# Patient Record
Sex: Male | Born: 1980 | Race: White | Hispanic: No | Marital: Single | Smoking: Never smoker
Health system: Southern US, Community
[De-identification: ages and names within clinical notes are randomized; demographics above are authoritative.]

## PROBLEM LIST (undated history)

## (undated) DIAGNOSIS — J45909 Unspecified asthma, uncomplicated: Secondary | ICD-10-CM

## (undated) DIAGNOSIS — F411 Generalized anxiety disorder: Secondary | ICD-10-CM

## (undated) DIAGNOSIS — M899 Disorder of bone, unspecified: Secondary | ICD-10-CM

## (undated) DIAGNOSIS — E708 Other disorders of aromatic amino-acid metabolism: Secondary | ICD-10-CM

## (undated) DIAGNOSIS — K5289 Other specified noninfective gastroenteritis and colitis: Secondary | ICD-10-CM

## (undated) DIAGNOSIS — H6980 Other specified disorders of Eustachian tube, unspecified ear: Secondary | ICD-10-CM

## (undated) DIAGNOSIS — N189 Chronic kidney disease, unspecified: Secondary | ICD-10-CM

## (undated) DIAGNOSIS — I498 Other specified cardiac arrhythmias: Secondary | ICD-10-CM

## (undated) DIAGNOSIS — K5100 Ulcerative (chronic) pancolitis without complications: Secondary | ICD-10-CM

## (undated) DIAGNOSIS — E2740 Unspecified adrenocortical insufficiency: Secondary | ICD-10-CM

## (undated) DIAGNOSIS — D689 Coagulation defect, unspecified: Secondary | ICD-10-CM

## (undated) DIAGNOSIS — E7089 Other disorders of aromatic amino-acid metabolism: Secondary | ICD-10-CM

## (undated) DIAGNOSIS — E70331 Hermansky-Pudlak syndrome: Secondary | ICD-10-CM

## (undated) DIAGNOSIS — H547 Unspecified visual loss: Secondary | ICD-10-CM

## (undated) DIAGNOSIS — Z9049 Acquired absence of other specified parts of digestive tract: Secondary | ICD-10-CM

## (undated) DIAGNOSIS — M79609 Pain in unspecified limb: Secondary | ICD-10-CM

## (undated) DIAGNOSIS — T7840XA Allergy, unspecified, initial encounter: Secondary | ICD-10-CM

## (undated) DIAGNOSIS — D509 Iron deficiency anemia, unspecified: Secondary | ICD-10-CM

## (undated) DIAGNOSIS — J309 Allergic rhinitis, unspecified: Secondary | ICD-10-CM

## (undated) DIAGNOSIS — H6990 Unspecified Eustachian tube disorder, unspecified ear: Secondary | ICD-10-CM

## (undated) DIAGNOSIS — L708 Other acne: Secondary | ICD-10-CM

## (undated) DIAGNOSIS — M949 Disorder of cartilage, unspecified: Secondary | ICD-10-CM

## (undated) DIAGNOSIS — J189 Pneumonia, unspecified organism: Secondary | ICD-10-CM

## (undated) HISTORY — DX: Iron deficiency anemia, unspecified: D50.9

## (undated) HISTORY — DX: Hermansky-Pudlak syndrome: E70.331

## (undated) HISTORY — DX: Unspecified adrenocortical insufficiency: E27.40

## (undated) HISTORY — DX: Generalized anxiety disorder: F41.1

## (undated) HISTORY — DX: Allergic rhinitis, unspecified: J30.9

## (undated) HISTORY — DX: Pain in unspecified limb: M79.609

## (undated) HISTORY — DX: Pneumonia, unspecified organism: J18.9

## (undated) HISTORY — DX: Allergy, unspecified, initial encounter: T78.40XA

## (undated) HISTORY — DX: Disorder of bone, unspecified: M89.9

## (undated) HISTORY — DX: Other specified noninfective gastroenteritis and colitis: K52.89

## (undated) HISTORY — DX: Unspecified visual loss: H54.7

## (undated) HISTORY — DX: Other disorders of aromatic amino-acid metabolism: E70.8

## (undated) HISTORY — DX: Ulcerative (chronic) pancolitis without complications: K51.00

## (undated) HISTORY — DX: Other disorders of aromatic amino-acid metabolism: E70.89

## (undated) HISTORY — DX: Disorder of cartilage, unspecified: M94.9

## (undated) HISTORY — DX: Other acne: L70.8

## (undated) HISTORY — DX: Acquired absence of other specified parts of digestive tract: Z90.49

## (undated) HISTORY — DX: Unspecified asthma, uncomplicated: J45.909

## (undated) HISTORY — DX: Other specified cardiac arrhythmias: I49.8

## (undated) HISTORY — DX: Unspecified eustachian tube disorder, unspecified ear: H69.90

## (undated) HISTORY — DX: Other specified disorders of Eustachian tube, unspecified ear: H69.80

---
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
## 2001-01-10 ENCOUNTER — Encounter (HOSPITAL_COMMUNITY): Admission: RE | Admit: 2001-01-10 | Discharge: 2001-04-10 | Payer: Self-pay | Admitting: Oncology

## 2001-04-30 ENCOUNTER — Encounter (HOSPITAL_COMMUNITY): Admission: RE | Admit: 2001-04-30 | Discharge: 2001-06-11 | Payer: Self-pay | Admitting: Oncology

## 2001-07-25 ENCOUNTER — Encounter (HOSPITAL_COMMUNITY): Admission: RE | Admit: 2001-07-25 | Discharge: 2001-10-23 | Payer: Self-pay | Admitting: Oncology

## 2001-11-07 ENCOUNTER — Encounter (HOSPITAL_COMMUNITY): Admission: RE | Admit: 2001-11-07 | Discharge: 2002-02-05 | Payer: Self-pay | Admitting: Oncology

## 2001-12-09 HISTORY — PX: OTHER SURGICAL HISTORY: SHX169

## 2002-01-30 ENCOUNTER — Other Ambulatory Visit: Admission: RE | Admit: 2002-01-30 | Discharge: 2002-01-30 | Payer: Self-pay | Admitting: Oncology

## 2002-01-30 ENCOUNTER — Encounter (INDEPENDENT_AMBULATORY_CARE_PROVIDER_SITE_OTHER): Payer: Self-pay

## 2002-02-21 ENCOUNTER — Encounter (HOSPITAL_COMMUNITY): Admission: RE | Admit: 2002-02-21 | Discharge: 2002-05-22 | Payer: Self-pay | Admitting: Oncology

## 2002-06-03 ENCOUNTER — Encounter (HOSPITAL_COMMUNITY): Admission: RE | Admit: 2002-06-03 | Discharge: 2002-09-01 | Payer: Self-pay | Admitting: Oncology

## 2002-09-02 ENCOUNTER — Encounter (HOSPITAL_COMMUNITY): Admission: RE | Admit: 2002-09-02 | Discharge: 2002-09-03 | Payer: Self-pay | Admitting: Oncology

## 2002-11-12 ENCOUNTER — Encounter: Admission: RE | Admit: 2002-11-12 | Discharge: 2002-11-12 | Payer: Self-pay | Admitting: Oncology

## 2003-01-07 ENCOUNTER — Encounter (HOSPITAL_COMMUNITY): Admission: RE | Admit: 2003-01-07 | Discharge: 2003-04-07 | Payer: Self-pay | Admitting: Oncology

## 2003-05-01 ENCOUNTER — Encounter (HOSPITAL_COMMUNITY): Admission: RE | Admit: 2003-05-01 | Discharge: 2003-05-01 | Payer: Self-pay | Admitting: Oncology

## 2003-06-13 ENCOUNTER — Encounter: Admission: RE | Admit: 2003-06-13 | Discharge: 2003-09-11 | Payer: Self-pay | Admitting: Oncology

## 2003-09-01 ENCOUNTER — Encounter: Payer: Self-pay | Admitting: Emergency Medicine

## 2003-09-01 ENCOUNTER — Observation Stay (HOSPITAL_COMMUNITY): Admission: AD | Admit: 2003-09-01 | Discharge: 2003-09-02 | Payer: Self-pay

## 2003-09-01 HISTORY — PX: OTHER SURGICAL HISTORY: SHX169

## 2003-10-15 ENCOUNTER — Encounter (HOSPITAL_COMMUNITY): Admission: RE | Admit: 2003-10-15 | Discharge: 2004-01-13 | Payer: Self-pay | Admitting: Oncology

## 2004-02-11 ENCOUNTER — Encounter (HOSPITAL_COMMUNITY): Admission: RE | Admit: 2004-02-11 | Discharge: 2004-05-11 | Payer: Self-pay | Admitting: Oncology

## 2004-06-17 ENCOUNTER — Encounter (HOSPITAL_COMMUNITY): Admission: RE | Admit: 2004-06-17 | Discharge: 2004-09-15 | Payer: Self-pay | Admitting: Oncology

## 2004-08-25 ENCOUNTER — Ambulatory Visit: Payer: Self-pay | Admitting: Internal Medicine

## 2004-08-25 ENCOUNTER — Ambulatory Visit (HOSPITAL_BASED_OUTPATIENT_CLINIC_OR_DEPARTMENT_OTHER): Admission: RE | Admit: 2004-08-25 | Discharge: 2004-08-25 | Payer: Self-pay | Admitting: Internal Medicine

## 2004-08-25 HISTORY — PX: OTHER SURGICAL HISTORY: SHX169

## 2004-09-21 ENCOUNTER — Emergency Department (HOSPITAL_COMMUNITY): Admission: EM | Admit: 2004-09-21 | Discharge: 2004-09-21 | Payer: Self-pay | Admitting: Emergency Medicine

## 2004-10-04 ENCOUNTER — Ambulatory Visit: Payer: Self-pay | Admitting: Internal Medicine

## 2004-10-13 ENCOUNTER — Encounter (HOSPITAL_COMMUNITY): Admission: RE | Admit: 2004-10-13 | Discharge: 2004-10-19 | Payer: Self-pay | Admitting: Oncology

## 2005-02-24 ENCOUNTER — Encounter (HOSPITAL_COMMUNITY): Admission: RE | Admit: 2005-02-24 | Discharge: 2005-05-25 | Payer: Self-pay | Admitting: Oncology

## 2005-03-29 ENCOUNTER — Ambulatory Visit: Payer: Self-pay | Admitting: Oncology

## 2005-05-09 ENCOUNTER — Ambulatory Visit: Payer: Self-pay | Admitting: Pulmonary Disease

## 2005-05-16 ENCOUNTER — Ambulatory Visit: Payer: Self-pay | Admitting: Internal Medicine

## 2005-06-02 ENCOUNTER — Ambulatory Visit: Payer: Self-pay | Admitting: Internal Medicine

## 2005-06-13 ENCOUNTER — Ambulatory Visit: Payer: Self-pay | Admitting: Internal Medicine

## 2005-07-18 ENCOUNTER — Ambulatory Visit: Payer: Self-pay | Admitting: Cardiology

## 2005-08-04 ENCOUNTER — Ambulatory Visit: Payer: Self-pay | Admitting: Infectious Diseases

## 2005-09-04 ENCOUNTER — Encounter (HOSPITAL_COMMUNITY): Admission: RE | Admit: 2005-09-04 | Discharge: 2005-12-03 | Payer: Self-pay | Admitting: Oncology

## 2006-01-18 ENCOUNTER — Ambulatory Visit: Payer: Self-pay | Admitting: Internal Medicine

## 2006-02-13 ENCOUNTER — Ambulatory Visit: Payer: Self-pay | Admitting: Internal Medicine

## 2006-08-29 ENCOUNTER — Ambulatory Visit: Payer: Self-pay | Admitting: Oncology

## 2006-08-31 LAB — CBC WITH DIFFERENTIAL/PLATELET
Basophils Absolute: 0.1 10*3/uL (ref 0.0–0.1)
Eosinophils Absolute: 0.2 10*3/uL (ref 0.0–0.5)
HGB: 13.7 g/dL (ref 13.0–17.1)
MCV: 78.4 fL — ABNORMAL LOW (ref 81.6–98.0)
MONO#: 0.8 10*3/uL (ref 0.1–0.9)
MONO%: 16.2 % — ABNORMAL HIGH (ref 0.0–13.0)
NEUT#: 1.3 10*3/uL — ABNORMAL LOW (ref 1.5–6.5)
RDW: 11 % — ABNORMAL LOW (ref 11.2–14.6)

## 2006-08-31 LAB — COMPREHENSIVE METABOLIC PANEL
ALT: 15 U/L (ref 0–53)
Albumin: 4.5 g/dL (ref 3.5–5.2)
CO2: 22 mEq/L (ref 19–32)
Calcium: 9.2 mg/dL (ref 8.4–10.5)
Chloride: 104 mEq/L (ref 96–112)
Glucose, Bld: 90 mg/dL (ref 70–99)
Potassium: 3.5 mEq/L (ref 3.5–5.3)
Sodium: 137 mEq/L (ref 135–145)
Total Protein: 8.5 g/dL — ABNORMAL HIGH (ref 6.0–8.3)

## 2006-08-31 LAB — FERRITIN: Ferritin: 67 ng/mL (ref 22–322)

## 2006-10-05 LAB — CBC WITH DIFFERENTIAL/PLATELET
BASO%: 0.6 % (ref 0.0–2.0)
EOS%: 6.4 % (ref 0.0–7.0)
MCH: 27 pg — ABNORMAL LOW (ref 28.0–33.4)
MCHC: 34.3 g/dL (ref 32.0–35.9)
MONO#: 0.9 10*3/uL (ref 0.1–0.9)
NEUT%: 43.1 % (ref 40.0–75.0)
RBC: 5.16 10*6/uL (ref 4.20–5.71)
WBC: 5.9 10*3/uL (ref 4.0–10.0)
lymph#: 2.1 10*3/uL (ref 0.9–3.3)

## 2006-10-05 LAB — COMPREHENSIVE METABOLIC PANEL
ALT: 14 U/L (ref 0–53)
AST: 13 U/L (ref 0–37)
Alkaline Phosphatase: 55 U/L (ref 39–117)
BUN: 19 mg/dL (ref 6–23)
Calcium: 9.2 mg/dL (ref 8.4–10.5)
Chloride: 106 mEq/L (ref 96–112)
Creatinine, Ser: 1.32 mg/dL (ref 0.40–1.50)
Potassium: 4 mEq/L (ref 3.5–5.3)

## 2006-10-05 LAB — CHCC SMEAR

## 2006-10-31 ENCOUNTER — Ambulatory Visit: Payer: Self-pay | Admitting: Oncology

## 2006-11-02 LAB — CBC WITH DIFFERENTIAL/PLATELET
BASO%: 0.6 % (ref 0.0–2.0)
EOS%: 3.9 % (ref 0.0–7.0)
HGB: 14.1 g/dL (ref 13.0–17.1)
MCH: 26.8 pg — ABNORMAL LOW (ref 28.0–33.4)
MCHC: 34.7 g/dL (ref 32.0–35.9)
MCV: 77.3 fL — ABNORMAL LOW (ref 81.6–98.0)
MONO%: 13.8 % — ABNORMAL HIGH (ref 0.0–13.0)
RBC: 5.24 10*6/uL (ref 4.20–5.71)
RDW: 13.1 % (ref 11.2–14.6)
lymph#: 2.4 10*3/uL (ref 0.9–3.3)

## 2006-11-09 ENCOUNTER — Ambulatory Visit: Payer: Self-pay | Admitting: Family Medicine

## 2006-11-09 DIAGNOSIS — E708 Other disorders of aromatic amino-acid metabolism: Secondary | ICD-10-CM

## 2006-11-09 DIAGNOSIS — D509 Iron deficiency anemia, unspecified: Secondary | ICD-10-CM

## 2006-11-09 DIAGNOSIS — J45909 Unspecified asthma, uncomplicated: Secondary | ICD-10-CM | POA: Insufficient documentation

## 2006-11-09 DIAGNOSIS — K5100 Ulcerative (chronic) pancolitis without complications: Secondary | ICD-10-CM | POA: Insufficient documentation

## 2006-11-09 DIAGNOSIS — I498 Other specified cardiac arrhythmias: Secondary | ICD-10-CM

## 2006-11-09 DIAGNOSIS — F411 Generalized anxiety disorder: Secondary | ICD-10-CM

## 2006-11-10 DIAGNOSIS — J309 Allergic rhinitis, unspecified: Secondary | ICD-10-CM | POA: Insufficient documentation

## 2006-11-26 ENCOUNTER — Encounter (INDEPENDENT_AMBULATORY_CARE_PROVIDER_SITE_OTHER): Payer: Self-pay | Admitting: *Deleted

## 2006-11-28 ENCOUNTER — Ambulatory Visit: Payer: Self-pay | Admitting: Family Medicine

## 2006-12-17 ENCOUNTER — Ambulatory Visit: Payer: Self-pay | Admitting: Professional

## 2006-12-24 ENCOUNTER — Ambulatory Visit: Payer: Self-pay | Admitting: Professional

## 2006-12-31 ENCOUNTER — Ambulatory Visit: Payer: Self-pay | Admitting: Professional

## 2007-01-07 ENCOUNTER — Ambulatory Visit: Payer: Self-pay | Admitting: Professional

## 2007-01-14 ENCOUNTER — Ambulatory Visit: Payer: Self-pay | Admitting: Professional

## 2007-01-21 ENCOUNTER — Ambulatory Visit: Payer: Self-pay | Admitting: Professional

## 2007-02-22 ENCOUNTER — Ambulatory Visit: Payer: Self-pay | Admitting: Oncology

## 2007-03-25 ENCOUNTER — Ambulatory Visit: Payer: Self-pay | Admitting: Professional

## 2007-04-01 ENCOUNTER — Ambulatory Visit: Payer: Self-pay | Admitting: Professional

## 2007-04-08 ENCOUNTER — Ambulatory Visit: Payer: Self-pay | Admitting: Professional

## 2007-04-15 ENCOUNTER — Ambulatory Visit: Payer: Self-pay | Admitting: Professional

## 2007-04-29 ENCOUNTER — Ambulatory Visit: Payer: Self-pay | Admitting: Professional

## 2007-04-30 ENCOUNTER — Encounter: Payer: Self-pay | Admitting: Family Medicine

## 2007-05-12 ENCOUNTER — Encounter: Payer: Self-pay | Admitting: Family Medicine

## 2007-05-13 ENCOUNTER — Ambulatory Visit: Payer: Self-pay | Admitting: Professional

## 2007-06-12 ENCOUNTER — Ambulatory Visit: Payer: Self-pay | Admitting: Family Medicine

## 2007-06-21 ENCOUNTER — Ambulatory Visit: Payer: Self-pay | Admitting: Oncology

## 2007-06-21 LAB — CBC WITH DIFFERENTIAL/PLATELET
Basophils Absolute: 0 10*3/uL (ref 0.0–0.1)
EOS%: 4.7 % (ref 0.0–7.0)
Eosinophils Absolute: 0.2 10*3/uL (ref 0.0–0.5)
LYMPH%: 45.4 % (ref 14.0–48.0)
MCH: 26.6 pg — ABNORMAL LOW (ref 28.0–33.4)
MCV: 78 fL — ABNORMAL LOW (ref 81.6–98.0)
MONO%: 15.5 % — ABNORMAL HIGH (ref 0.0–13.0)
Platelets: 223 10*3/uL (ref 145–400)
RBC: 5.23 10*6/uL (ref 4.20–5.71)
RDW: 13 % (ref 11.2–14.6)

## 2007-06-21 LAB — CK: Total CK: 54 U/L (ref 7–232)

## 2007-06-21 LAB — T4, FREE: Free T4: 1.14 ng/dL (ref 0.89–1.80)

## 2007-07-03 ENCOUNTER — Ambulatory Visit: Payer: Self-pay | Admitting: Family Medicine

## 2007-07-05 ENCOUNTER — Encounter: Payer: Self-pay | Admitting: Family Medicine

## 2007-07-05 ENCOUNTER — Ambulatory Visit: Payer: Self-pay | Admitting: Family Medicine

## 2007-07-21 ENCOUNTER — Emergency Department (HOSPITAL_COMMUNITY): Admission: EM | Admit: 2007-07-21 | Discharge: 2007-07-21 | Payer: Self-pay | Admitting: Family Medicine

## 2007-08-24 ENCOUNTER — Emergency Department (HOSPITAL_COMMUNITY): Admission: EM | Admit: 2007-08-24 | Discharge: 2007-08-24 | Payer: Self-pay | Admitting: Emergency Medicine

## 2007-08-26 ENCOUNTER — Telehealth (INDEPENDENT_AMBULATORY_CARE_PROVIDER_SITE_OTHER): Payer: Self-pay | Admitting: *Deleted

## 2007-08-28 ENCOUNTER — Telehealth: Payer: Self-pay | Admitting: Internal Medicine

## 2007-08-28 ENCOUNTER — Ambulatory Visit: Payer: Self-pay | Admitting: Internal Medicine

## 2007-08-29 ENCOUNTER — Encounter: Payer: Self-pay | Admitting: Family Medicine

## 2007-09-16 ENCOUNTER — Ambulatory Visit: Payer: Self-pay | Admitting: Internal Medicine

## 2007-10-14 ENCOUNTER — Telehealth (INDEPENDENT_AMBULATORY_CARE_PROVIDER_SITE_OTHER): Payer: Self-pay | Admitting: *Deleted

## 2007-11-20 ENCOUNTER — Encounter: Payer: Self-pay | Admitting: Family Medicine

## 2007-12-16 ENCOUNTER — Ambulatory Visit: Payer: Self-pay | Admitting: Internal Medicine

## 2008-01-10 ENCOUNTER — Ambulatory Visit: Payer: Self-pay | Admitting: Internal Medicine

## 2008-01-10 LAB — CONVERTED CEMR LAB: Streptococcus, Group A Screen (Direct): NEGATIVE

## 2008-02-04 ENCOUNTER — Encounter: Payer: Self-pay | Admitting: Family Medicine

## 2008-03-16 ENCOUNTER — Ambulatory Visit: Payer: Self-pay | Admitting: Family Medicine

## 2008-04-09 ENCOUNTER — Ambulatory Visit: Payer: Self-pay | Admitting: Family Medicine

## 2008-04-20 ENCOUNTER — Ambulatory Visit: Payer: Self-pay | Admitting: Internal Medicine

## 2008-04-24 ENCOUNTER — Telehealth: Payer: Self-pay | Admitting: Internal Medicine

## 2008-04-30 ENCOUNTER — Telehealth (INDEPENDENT_AMBULATORY_CARE_PROVIDER_SITE_OTHER): Payer: Self-pay | Admitting: *Deleted

## 2008-05-19 ENCOUNTER — Telehealth (INDEPENDENT_AMBULATORY_CARE_PROVIDER_SITE_OTHER): Payer: Self-pay | Admitting: *Deleted

## 2008-05-20 ENCOUNTER — Ambulatory Visit: Payer: Self-pay | Admitting: Internal Medicine

## 2008-05-21 ENCOUNTER — Telehealth (INDEPENDENT_AMBULATORY_CARE_PROVIDER_SITE_OTHER): Payer: Self-pay | Admitting: *Deleted

## 2008-05-21 LAB — CONVERTED CEMR LAB
Bilirubin Urine: NEGATIVE
Crystals: NEGATIVE
Hemoglobin, Urine: NEGATIVE
Ketones, ur: NEGATIVE mg/dL
Leukocytes, UA: NEGATIVE
Mucus, UA: NEGATIVE
Nitrite: NEGATIVE
Specific Gravity, Urine: >=1.03 (ref 1.000–1.03)
Total Protein, Urine: NEGATIVE mg/dL
Urine Glucose: NEGATIVE mg/dL
Urobilinogen, UA: 0.2 (ref 0.0–1.0)
pH: 5.5 (ref 5.0–8.0)

## 2008-05-25 ENCOUNTER — Telehealth (INDEPENDENT_AMBULATORY_CARE_PROVIDER_SITE_OTHER): Payer: Self-pay | Admitting: *Deleted

## 2008-06-01 ENCOUNTER — Ambulatory Visit: Payer: Self-pay | Admitting: Oncology

## 2008-06-03 LAB — CBC WITH DIFFERENTIAL/PLATELET
Basophils Absolute: 0 10*3/uL (ref 0.0–0.1)
EOS%: 3.1 % (ref 0.0–7.0)
Eosinophils Absolute: 0.2 10*3/uL (ref 0.0–0.5)
HGB: 14.1 g/dL (ref 13.0–17.1)
NEUT#: 2 10*3/uL (ref 1.5–6.5)
RDW: 13.8 % (ref 11.2–14.6)
lymph#: 2.7 10*3/uL (ref 0.9–3.3)

## 2008-06-08 ENCOUNTER — Ambulatory Visit: Payer: Self-pay | Admitting: Internal Medicine

## 2008-06-08 ENCOUNTER — Inpatient Hospital Stay (HOSPITAL_COMMUNITY): Admission: EM | Admit: 2008-06-08 | Discharge: 2008-06-11 | Payer: Self-pay | Admitting: Emergency Medicine

## 2008-06-08 ENCOUNTER — Ambulatory Visit: Payer: Self-pay | Admitting: Pulmonary Disease

## 2008-06-09 ENCOUNTER — Encounter: Payer: Self-pay | Admitting: Family Medicine

## 2008-06-09 ENCOUNTER — Telehealth: Payer: Self-pay | Admitting: Internal Medicine

## 2008-06-09 HISTORY — PX: OTHER SURGICAL HISTORY: SHX169

## 2008-06-11 ENCOUNTER — Encounter: Payer: Self-pay | Admitting: Family Medicine

## 2008-06-11 ENCOUNTER — Telehealth: Payer: Self-pay | Admitting: Internal Medicine

## 2008-06-15 ENCOUNTER — Encounter (INDEPENDENT_AMBULATORY_CARE_PROVIDER_SITE_OTHER): Payer: Self-pay | Admitting: *Deleted

## 2008-06-29 ENCOUNTER — Encounter: Payer: Self-pay | Admitting: Family Medicine

## 2008-06-29 ENCOUNTER — Encounter: Payer: Self-pay | Admitting: Internal Medicine

## 2008-07-09 ENCOUNTER — Ambulatory Visit: Payer: Self-pay | Admitting: Professional

## 2008-07-16 ENCOUNTER — Ambulatory Visit: Payer: Self-pay | Admitting: Professional

## 2008-07-23 ENCOUNTER — Ambulatory Visit: Payer: Self-pay | Admitting: Professional

## 2008-07-23 ENCOUNTER — Ambulatory Visit: Payer: Self-pay | Admitting: Oncology

## 2008-07-24 ENCOUNTER — Ambulatory Visit: Payer: Self-pay | Admitting: Infectious Diseases

## 2008-07-24 DIAGNOSIS — R509 Fever, unspecified: Secondary | ICD-10-CM

## 2008-07-24 LAB — CONVERTED CEMR LAB
Albumin: 4.1 g/dL (ref 3.5–5.2)
CMV IgM: <8 (ref <30.0)
CO2: 20 meq/L (ref 19–32)
Calcium: 9.5 mg/dL (ref 8.4–10.5)
Chloride: 102 meq/L (ref 96–112)
Cytomegalovirus Ab-IgG: NEGATIVE
Glucose, Bld: 92 mg/dL (ref 70–99)
Hemoglobin: 13.7 g/dL (ref 13.0–17.0)
Lymphs Abs: 1.7 10*3/uL (ref 0.7–4.0)
MCV: 79.8 fL (ref 78.0–100.0)
Monocytes Relative: 14 % — ABNORMAL HIGH (ref 3–12)
Neutro Abs: 2.7 10*3/uL (ref 1.7–7.7)
Neutrophils Relative %: 49 % (ref 43–77)
Potassium: 3.8 meq/L (ref 3.5–5.3)
RBC: 5.15 M/uL (ref 4.22–5.81)
Sodium: 138 meq/L (ref 135–145)
Total Protein: 9.5 g/dL — ABNORMAL HIGH (ref 6.0–8.3)
WBC: 5.4 10*3/uL (ref 4.0–10.5)

## 2008-07-27 ENCOUNTER — Encounter: Payer: Self-pay | Admitting: Family Medicine

## 2008-07-27 ENCOUNTER — Encounter: Payer: Self-pay | Admitting: Internal Medicine

## 2008-07-27 ENCOUNTER — Encounter: Payer: Self-pay | Admitting: Infectious Diseases

## 2008-07-27 LAB — CBC WITH DIFFERENTIAL/PLATELET
Eosinophils Absolute: 0.3 10*3/uL (ref 0.0–0.5)
HCT: 39.7 % (ref 38.7–49.9)
LYMPH%: 37.3 % (ref 14.0–48.0)
MONO#: 0.9 10*3/uL (ref 0.1–0.9)
NEUT#: 2.3 10*3/uL (ref 1.5–6.5)
NEUT%: 41.5 % (ref 40.0–75.0)
Platelets: 220 10*3/uL (ref 145–400)
WBC: 5.6 10*3/uL (ref 4.0–10.0)

## 2008-07-27 LAB — IRON AND TIBC
%SAT: 23 % (ref 20–55)
TIBC: 247 ug/dL (ref 215–435)

## 2008-07-28 ENCOUNTER — Ambulatory Visit (HOSPITAL_COMMUNITY): Admission: RE | Admit: 2008-07-28 | Discharge: 2008-07-28 | Payer: Self-pay | Admitting: Infectious Diseases

## 2008-07-30 ENCOUNTER — Ambulatory Visit: Payer: Self-pay | Admitting: Professional

## 2008-08-07 ENCOUNTER — Ambulatory Visit: Payer: Self-pay | Admitting: Infectious Diseases

## 2008-08-20 ENCOUNTER — Ambulatory Visit: Payer: Self-pay | Admitting: Professional

## 2008-08-24 ENCOUNTER — Encounter: Payer: Self-pay | Admitting: Family Medicine

## 2008-08-27 ENCOUNTER — Ambulatory Visit: Payer: Self-pay | Admitting: Professional

## 2008-09-03 ENCOUNTER — Ambulatory Visit: Payer: Self-pay | Admitting: Professional

## 2008-09-24 ENCOUNTER — Ambulatory Visit: Payer: Self-pay | Admitting: Family Medicine

## 2008-09-24 DIAGNOSIS — G43909 Migraine, unspecified, not intractable, without status migrainosus: Secondary | ICD-10-CM | POA: Insufficient documentation

## 2008-10-01 ENCOUNTER — Ambulatory Visit: Payer: Self-pay | Admitting: Professional

## 2008-10-15 ENCOUNTER — Ambulatory Visit: Payer: Self-pay | Admitting: Professional

## 2008-10-19 ENCOUNTER — Ambulatory Visit: Payer: Self-pay | Admitting: Internal Medicine

## 2008-10-22 ENCOUNTER — Ambulatory Visit: Payer: Self-pay | Admitting: Oncology

## 2008-10-26 ENCOUNTER — Encounter: Payer: Self-pay | Admitting: Internal Medicine

## 2008-10-26 ENCOUNTER — Encounter: Payer: Self-pay | Admitting: Family Medicine

## 2008-10-26 LAB — CBC WITH DIFFERENTIAL/PLATELET
Eosinophils Absolute: 0.2 10*3/uL (ref 0.0–0.5)
HGB: 12.7 g/dL — ABNORMAL LOW (ref 13.0–17.1)
LYMPH%: 48 % (ref 14.0–49.0)
MONO#: 0.9 10*3/uL (ref 0.1–0.9)
NEUT#: 2.3 10*3/uL (ref 1.5–6.5)
Platelets: 217 10*3/uL (ref 140–400)
RBC: 4.77 10*6/uL (ref 4.20–5.82)
RDW: 13.8 % (ref 11.0–14.6)
WBC: 6.5 10*3/uL (ref 4.0–10.3)

## 2008-10-28 ENCOUNTER — Telehealth: Payer: Self-pay | Admitting: Family Medicine

## 2008-10-29 ENCOUNTER — Ambulatory Visit: Payer: Self-pay | Admitting: Professional

## 2008-11-02 ENCOUNTER — Ambulatory Visit: Payer: Self-pay | Admitting: Family Medicine

## 2008-11-27 ENCOUNTER — Ambulatory Visit: Payer: Self-pay | Admitting: Internal Medicine

## 2008-12-03 ENCOUNTER — Ambulatory Visit: Payer: Self-pay | Admitting: Professional

## 2009-01-07 ENCOUNTER — Ambulatory Visit: Payer: Self-pay | Admitting: Professional

## 2009-01-28 ENCOUNTER — Ambulatory Visit: Payer: Self-pay | Admitting: Professional

## 2009-02-23 ENCOUNTER — Encounter: Payer: Self-pay | Admitting: Family Medicine

## 2009-04-23 ENCOUNTER — Ambulatory Visit: Payer: Self-pay | Admitting: Oncology

## 2009-06-21 ENCOUNTER — Ambulatory Visit: Payer: Self-pay | Admitting: Internal Medicine

## 2009-08-03 ENCOUNTER — Ambulatory Visit: Payer: Self-pay | Admitting: Family Medicine

## 2009-08-03 LAB — CONVERTED CEMR LAB
Bilirubin Urine: NEGATIVE
Blood in Urine, dipstick: NEGATIVE
Urobilinogen, UA: 0.2
pH: 6

## 2009-08-04 ENCOUNTER — Encounter: Payer: Self-pay | Admitting: Family Medicine

## 2009-08-06 ENCOUNTER — Encounter: Payer: Self-pay | Admitting: Family Medicine

## 2009-08-10 ENCOUNTER — Encounter: Payer: Self-pay | Admitting: Family Medicine

## 2009-08-23 ENCOUNTER — Ambulatory Visit: Payer: Self-pay | Admitting: Oncology

## 2009-08-23 LAB — CBC WITH DIFFERENTIAL/PLATELET
BASO%: 0.3 % (ref 0.0–2.0)
Eosinophils Absolute: 0.1 10*3/uL (ref 0.0–0.5)
LYMPH%: 37.7 % (ref 14.0–49.0)
MCHC: 33.5 g/dL (ref 32.0–36.0)
MONO#: 0.9 10*3/uL (ref 0.1–0.9)
MONO%: 12.6 % (ref 0.0–14.0)
NEUT#: 3.6 10*3/uL (ref 1.5–6.5)
RBC: 4.95 10*6/uL (ref 4.20–5.82)
RDW: 13.8 % (ref 11.0–14.6)
WBC: 7.4 10*3/uL (ref 4.0–10.3)

## 2009-08-23 LAB — PROTHROMBIN TIME: Prothrombin Time: 12.7 seconds (ref 11.6–15.2)

## 2009-08-23 LAB — COMPREHENSIVE METABOLIC PANEL
ALT: 18 U/L (ref 0–53)
Albumin: 3.5 g/dL (ref 3.5–5.2)
Alkaline Phosphatase: 64 U/L (ref 39–117)
CO2: 25 mEq/L (ref 19–32)
Glucose, Bld: 111 mg/dL — ABNORMAL HIGH (ref 70–99)
Potassium: 3.5 mEq/L (ref 3.5–5.3)
Sodium: 137 mEq/L (ref 135–145)
Total Bilirubin: 0.5 mg/dL (ref 0.3–1.2)
Total Protein: 8.1 g/dL (ref 6.0–8.3)

## 2009-10-06 ENCOUNTER — Telehealth: Payer: Self-pay | Admitting: Family Medicine

## 2009-10-22 ENCOUNTER — Ambulatory Visit: Payer: Self-pay | Admitting: Oncology

## 2009-11-16 ENCOUNTER — Encounter: Payer: Self-pay | Admitting: Family Medicine

## 2009-11-26 ENCOUNTER — Ambulatory Visit: Payer: Self-pay | Admitting: Oncology

## 2009-11-30 ENCOUNTER — Encounter: Payer: Self-pay | Admitting: Infectious Diseases

## 2009-11-30 LAB — CBC WITH DIFFERENTIAL/PLATELET
BASO%: 0.4 % (ref 0.0–2.0)
MCHC: 33.6 g/dL (ref 32.0–36.0)
MONO#: 0.9 10*3/uL (ref 0.1–0.9)
NEUT#: 3.9 10*3/uL (ref 1.5–6.5)
RBC: 4.7 10*6/uL (ref 4.20–5.82)
WBC: 8.7 10*3/uL (ref 4.0–10.3)
lymph#: 3.8 10*3/uL — ABNORMAL HIGH (ref 0.9–3.3)

## 2009-12-06 LAB — CBC WITH DIFFERENTIAL/PLATELET
Basophils Absolute: 0 10*3/uL (ref 0.0–0.1)
EOS%: 0.9 % (ref 0.0–7.0)
Eosinophils Absolute: 0.1 10*3/uL (ref 0.0–0.5)
HCT: 39.6 % (ref 38.4–49.9)
HGB: 13.4 g/dL (ref 13.0–17.1)
LYMPH%: 33.9 % (ref 14.0–49.0)
MONO#: 0.6 10*3/uL (ref 0.1–0.9)
MONO%: 8.5 % (ref 0.0–14.0)
NEUT#: 4.2 10*3/uL (ref 1.5–6.5)
WBC: 7.5 10*3/uL (ref 4.0–10.3)
lymph#: 2.6 10*3/uL (ref 0.9–3.3)

## 2009-12-13 ENCOUNTER — Telehealth (INDEPENDENT_AMBULATORY_CARE_PROVIDER_SITE_OTHER): Payer: Self-pay | Admitting: *Deleted

## 2009-12-14 ENCOUNTER — Ambulatory Visit: Payer: Self-pay | Admitting: Internal Medicine

## 2009-12-16 ENCOUNTER — Ambulatory Visit: Payer: Self-pay | Admitting: Internal Medicine

## 2009-12-21 ENCOUNTER — Telehealth (INDEPENDENT_AMBULATORY_CARE_PROVIDER_SITE_OTHER): Payer: Self-pay | Admitting: *Deleted

## 2009-12-23 ENCOUNTER — Ambulatory Visit: Payer: Self-pay | Admitting: Internal Medicine

## 2009-12-27 ENCOUNTER — Encounter: Payer: Self-pay | Admitting: Family Medicine

## 2009-12-28 LAB — CONVERTED CEMR LAB
Basophils Relative: 1.6 % (ref 0.0–3.0)
Eosinophils Absolute: 0.3 10*3/uL (ref 0.0–0.7)
Hemoglobin: 14.8 g/dL (ref 13.0–17.0)
Lymphocytes Relative: 47.6 % — ABNORMAL HIGH (ref 12.0–46.0)
MCHC: 33.5 g/dL (ref 30.0–36.0)
Neutro Abs: 2.3 10*3/uL (ref 1.4–7.7)
RBC: 5.32 M/uL (ref 4.22–5.81)

## 2009-12-30 ENCOUNTER — Ambulatory Visit: Payer: Self-pay | Admitting: Internal Medicine

## 2009-12-30 DIAGNOSIS — R197 Diarrhea, unspecified: Secondary | ICD-10-CM | POA: Insufficient documentation

## 2009-12-30 DIAGNOSIS — R000 Tachycardia, unspecified: Secondary | ICD-10-CM

## 2010-01-03 ENCOUNTER — Ambulatory Visit: Payer: Self-pay | Admitting: Oncology

## 2010-01-07 LAB — COMPREHENSIVE METABOLIC PANEL
ALT: 37 U/L (ref 0–53)
CO2: 23 mEq/L (ref 19–32)
Calcium: 8.9 mg/dL (ref 8.4–10.5)
Chloride: 107 mEq/L (ref 96–112)
Creatinine, Ser: 1.96 mg/dL — ABNORMAL HIGH (ref 0.40–1.50)
Glucose, Bld: 119 mg/dL — ABNORMAL HIGH (ref 70–99)
Total Protein: 8.2 g/dL (ref 6.0–8.3)

## 2010-01-20 ENCOUNTER — Ambulatory Visit: Payer: Self-pay | Admitting: Internal Medicine

## 2010-01-20 ENCOUNTER — Telehealth: Payer: Self-pay | Admitting: Internal Medicine

## 2010-01-21 ENCOUNTER — Ambulatory Visit: Payer: Self-pay | Admitting: Internal Medicine

## 2010-01-21 DIAGNOSIS — L272 Dermatitis due to ingested food: Secondary | ICD-10-CM | POA: Insufficient documentation

## 2010-04-08 ENCOUNTER — Encounter: Payer: Self-pay | Admitting: Family Medicine

## 2010-04-29 ENCOUNTER — Encounter: Payer: Self-pay | Admitting: Internal Medicine

## 2010-05-23 ENCOUNTER — Telehealth: Payer: Self-pay | Admitting: Internal Medicine

## 2010-05-24 ENCOUNTER — Ambulatory Visit: Payer: Self-pay | Admitting: Internal Medicine

## 2010-05-24 DIAGNOSIS — J018 Other acute sinusitis: Secondary | ICD-10-CM

## 2010-06-07 ENCOUNTER — Encounter: Payer: Self-pay | Admitting: Internal Medicine

## 2010-06-12 DIAGNOSIS — Z9049 Acquired absence of other specified parts of digestive tract: Secondary | ICD-10-CM

## 2010-06-12 HISTORY — DX: Acquired absence of other specified parts of digestive tract: Z90.49

## 2010-06-23 ENCOUNTER — Ambulatory Visit
Admission: RE | Admit: 2010-06-23 | Discharge: 2010-06-23 | Payer: Self-pay | Source: Home / Self Care | Attending: Professional | Admitting: Professional

## 2010-06-24 ENCOUNTER — Encounter (HOSPITAL_COMMUNITY)
Admission: RE | Admit: 2010-06-24 | Discharge: 2010-07-12 | Payer: Self-pay | Source: Home / Self Care | Attending: Oncology | Admitting: Oncology

## 2010-06-27 LAB — BASIC METABOLIC PANEL
BUN: 17 mg/dL (ref 6–23)
CO2: 23 mEq/L (ref 19–32)
Calcium: 9 mg/dL (ref 8.4–10.5)
Chloride: 104 mEq/L (ref 96–112)
Creatinine, Ser: 1.92 mg/dL — ABNORMAL HIGH (ref 0.4–1.5)
GFR calc Af Amer: 51 mL/min — ABNORMAL LOW (ref >60)
GFR calc non Af Amer: 42 mL/min — ABNORMAL LOW (ref >60)
Glucose, Bld: 117 mg/dL — ABNORMAL HIGH (ref 70–99)
Potassium: 3.4 mEq/L — ABNORMAL LOW (ref 3.5–5.1)
Sodium: 137 mEq/L (ref 135–145)

## 2010-06-27 LAB — CBC
HCT: 40.5 % (ref 39.0–52.0)
Hemoglobin: 14 g/dL (ref 13.0–17.0)
MCH: 28.5 pg (ref 26.0–34.0)
MCHC: 34.6 g/dL (ref 30.0–36.0)
MCV: 82.3 fL (ref 78.0–100.0)
Platelets: 195 10*3/uL (ref 150–400)
RBC: 4.92 MIL/uL (ref 4.22–5.81)
RDW: 15 % (ref 11.5–15.5)
WBC: 3.9 10*3/uL — ABNORMAL LOW (ref 4.0–10.5)

## 2010-06-27 LAB — DIFFERENTIAL
Basophils Absolute: 0 10*3/uL (ref 0.0–0.1)
Basophils Relative: 0 % (ref 0–1)
Eosinophils Absolute: 0 10*3/uL (ref 0.0–0.7)
Eosinophils Relative: 1 % (ref 0–5)
Lymphocytes Relative: 17 % (ref 12–46)
Lymphs Abs: 0.7 10*3/uL (ref 0.7–4.0)
Monocytes Absolute: 0.1 10*3/uL (ref 0.1–1.0)
Monocytes Relative: 3 % (ref 3–12)
Neutro Abs: 3.1 10*3/uL (ref 1.7–7.7)
Neutrophils Relative %: 79 % — ABNORMAL HIGH (ref 43–77)

## 2010-07-01 LAB — T3: T3, Total: 119.8 ng/dl (ref 80.0–204.0)

## 2010-07-01 LAB — T4: T4, Total: 7 ug/dL (ref 5.0–12.5)

## 2010-07-10 LAB — CONVERTED CEMR LAB
Basophils Absolute: 0.1 10*3/uL (ref 0.0–0.1)
Basophils Relative: 1.6 % (ref 0.0–3.0)
Eosinophils Absolute: 0.2 10*3/uL (ref 0.0–0.7)
Eosinophils Relative: 2.2 % (ref 0.0–5.0)
HCT: 39.7 % (ref 39.0–52.0)
Hemoglobin: 13.7 g/dL (ref 13.0–17.0)
Lymphocytes Relative: 35.7 % (ref 12.0–46.0)
MCHC: 34.5 g/dL (ref 30.0–36.0)
MCV: 80.6 fL (ref 78.0–100.0)
Monocytes Absolute: 1.1 10*3/uL — ABNORMAL HIGH (ref 0.1–1.0)
Monocytes Relative: 16.3 % — ABNORMAL HIGH (ref 3.0–12.0)
Neutro Abs: 3.1 10*3/uL (ref 1.4–7.7)
Neutrophils Relative %: 44.2 % (ref 43.0–77.0)
Platelets: 196 10*3/uL (ref 150–400)
RBC: 4.92 M/uL (ref 4.22–5.81)
RDW: 12.4 % (ref 11.5–14.6)
WBC: 7 10*3/uL (ref 4.5–10.5)

## 2010-07-12 NOTE — Assessment & Plan Note (Signed)
Summary: FEVER/CLE   Vital Signs:  Patient profile:   30 year old male Weight:      205 pounds Temp:     98.7 degrees F oral Pulse rate:   112 / minute Pulse rhythm:   regular BP sitting:   96 / 60  (left arm) Cuff size:   regular  Vitals Entered By: Sydell Axon LPN (August 03, 2009 9:09 AM) CC: Fever, nauseated, some confusion, pain in left side ? constipation, night sweats   History of Present Illness: Pt here with girlfriend, had feelings of fever last night , nauseated and congested with sweats and left sided pain and some diff urinating with urgency but couldn't urinate. He feels it  is probably virus but has been on 30mg  of Prednisone for some time now.  He has not been on Abs in the recent past.  He has had fever, no real headache, no ear pain , rhinitis or ST. He has had mild cough that is nonproductive and then the GI complaints above including left flank pain and urinary sxs, no real dysuria.  Allergies: 1)  ! Bl Aspirin  Physical Exam  General:  Well-developed,well-nourished,in no acute distress; alert,appropriate and cooperative throughout examination, slightly light sensitive when here.and wearing sunglasses. Looks tired. Head:  Normocephalic and atraumatic without obvious abnormalities. No apparent alopecia or balding. Sinuses nontender. Eyes:  Sunglasses in place with mild nausea. Conjunctiva clear bilaterally.  Ears:  External ear exam shows no significant lesions or deformities.  Otoscopic examination reveals clear canals, tympanic membranes are intact bilaterally without bulging, retraction, inflammation or discharge. Hearing is grossly normal bilaterally. Impacted with cerumen bilat....irrigated clear and TMs nml behind. Nose:  External nasal examination shows no deformity or inflammation. Nasal mucosa are pink and moist without lesions or exudates. Poor mobility of the TMs. Mouth:  Oral mucosa and oropharynx without lesions or exudates.  Teeth in good  repair. Neck:  No deformities, masses, or tenderness noted. Chest Wall:  No deformities, masses, tenderness or gynecomastia noted. Lungs:  Normal respiratory effort, chest expands symmetrically. Lungs are clear to auscultation, no crackles or wheezes. Heart:  Normal rate and regular rhythm. S1 and S2 normal without gallop, murmur, click, rub or other extra sounds. Abdomen:  Bowel sounds positive,abdomen soft  without masses, organomegaly or hernias noted. Mild tenderness to deep palpation in LLQ. No swelling and no rebound , referred. Msk:  C/o left flank pain but NT to direct palpaetion. No suprapubic or CVA tenderness.   Impression & Recommendations:  Problem # 1:  GASTROENTERITIS (ICD-558.9) ACUTE Assessment New See instructions. Will hold off ABs but low tolerance due to steroid use. He'll call if sxs don't improve and would use Cipro for short course.  Problem # 2:  FLANK PAIN, LEFT (ICD-789.09) Assessment: New  U/A looks ok, will culture. His updated medication list for this problem includes:    Vicodin 5-500 Mg Tabs (Hydrocodone-acetaminophen) .[name]... 1 every 4 hrs as needed pain  Discussed use of medications, application of heat or cold, and exercises.   Orders: UA Dipstick W/ Micro (manual) (04540) T-Culture, Urine ([id number])  Complete Medication List: 1)  Nexium 40 Mg Cpdr (Esomeprazole magnesium) .... Take 1 capsule by mouth once a day 2)  Cimzia 2 X 200 Mg Kit (Certolizumab pegol) .... 1/2 dosage every 2 weeks 3)  Proair Hfa 108 (90 Base) Mcg/act Aers (Albuterol sulfate) .... 2 puffs four times a day prn 4)  Luvox Cr 150 Mg Xr24h-cap (Fluvoxamine maleate) .[name].. 1 daily  by mouth 5)  Vicodin 5-500 Mg Tabs (Hydrocodone-acetaminophen) .[name].. 1 every 4 hrs as needed pain 6)  Prednisone 10 Mg Tabs (Prednisone) .... Take 30 mg by mouth daily 7)  Promethazine Hcl 25 Mg Tabs (Promethazine hcl) .... One tab by mouth every 6 hrs as needed nausea  Patient Instructions: 1)  Clear  Liqs for 24 hrs except take apple today and walk as able. Fluids to urinate at least every 2-3 hrs. 2)  Call if sxs cont. Prescriptions: PROMETHAZINE HCL 25 MG TABS (PROMETHAZINE HCL) one tab by mouth every 6 hrs as needed nausea  #20 x 0   Entered and Authorized by:   Shaune Leeks MD   Signed by:   Shaune Leeks MD on 08/03/2009   Method used:   [facility]        [facility]  [address] [phone]ail[facility]       [address]       [place], [place]  [address]       Ph: [phone] or [phone]       Fax: [phone]   RxID:   [id number]   Current Allergies (reviewed today): ! BL ASPIRIN  Laboratory Results   Urine Tests  Date/Time Received: August 03, 2009 10:02 AM  Date/Time Reported: August 03, 2009 10:02 AM   Routine Urinalysis   Color: yellow Appearance: Clear Glucose: negative   (Normal Range: Negative) Bilirubin: negative   (Normal Range: Negative) Ketone: negative   (Normal Range: Negative) Spec. Gravity: 1.020   (Normal Range: 1.003-1.035) Blood: negative   (Normal Range: Negative) pH: 6.0   (Normal Range: 5.0-8.0) Protein: trace   (Normal Range: Negative) Urobilinogen: 0.2   (Normal Range: 0-1) Nitrite: negative   (Normal Range: Negative) Leukocyte Esterace: trace   (Normal Range: Negative)  Urine Microscopic WBC/HPF: 0-1 RBC/HPF: 0-1 Bacteria/HPF: 0-1+ Epithelial/HPF: Rare

## 2010-07-12 NOTE — Letter (Signed)
[facility]   Imported By: Florinda Marker 12/10/2009 11:46:51  _____________________________________________________________________  External Attachment:    Type:   Image     Comment:   External Document

## 2010-07-12 NOTE — Miscellaneous (Signed)
Summary: Orders Update pft charges  Clinical Lists Changes  Orders: Added new Service order of Carbon Monoxide diffusing w/capacity (94720) - Signed Added new Service order of Lung Volumes (94240) - Signed Added new Service order of Spirometry (Pre & Post) (94060) - Signed 

## 2010-07-12 NOTE — Letter (Signed)
Summary: Dr.Kim Isaacs,GI,Note  Dr.Kim Isaacs,GI,Note   Imported By: Beau Fanny 08/16/2009 11:28:44  _____________________________________________________________________  External Attachment:    Type:   Image     Comment:   External Document

## 2010-07-12 NOTE — Letter (Signed)
Summary: [facility]  [facility]   Imported By: Lanelle Bal 04/19/2010 08:22:38  _____________________________________________________________________  External Attachment:    Type:   Image     Comment:   External Document
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
## 2010-07-12 NOTE — Letter (Signed)
Summary: [facility]   Imported By: Lanelle Bal 12/09/2009 10:45:44  _____________________________________________________________________  External Attachment:    Type:   Image     Comment:   External Document

## 2010-07-12 NOTE — Progress Notes (Signed)
Summary: has form to be completed  Phone Note Call from Patient Call back at Home Phone [phone]   Caller: Patient Call For: Shaune Leeks MD Summary of Call: Pt has a one page simple form that he needs completed and signed for a one day stay at rehab for low vision assesment.  He says he can answer the questions himself and you just have to sign the form.  I told him you might want to answer the questions if you are going to sign it.  Does he need a visit for this, or just send the form? Initial call taken by: Lowella Petties CMA,  October 06, 2009 4:12 PM  Follow-up for Phone Call        If he can bring the form at the end of the day and just be here while I sign it, no office visit, no charge. Follow-up by: Shaune Leeks MD,  October 06, 2009 4:18 PM  Additional Follow-up for Phone Call Additional follow up Details #1::        Advised pt, he will call back to let us know what day he is coming. Additional Follow-up by: Lowella Petties CMA,  October 06, 2009 4:21 PM

## 2010-07-12 NOTE — Progress Notes (Signed)
Summary: still sick  Phone Note Call from Patient Call back at Home Phone [phone]   Caller: Patient Call For: young Reason for Call: Talk to Nurse Summary of Call: sinus infection, upper resp infection, not completely gone, still coughing up green occasionally.  Does he need to come back in or camn he get another round of antibiotics? [facility] Initial call taken by: Eugene Gavia,  December 21, 2009 3:48 PM  Follow-up for Phone Call        Called and spoke with pt.  He was last seen here on 12/16/09 for acute visit- was given zpack which he finished yesterday and previously had round of avelox before started zithro.  He states that he has improved as far as sinus pressure and congestion, but still has prod cough with green/yellow sputum.  Would like to know if needs another round of abx at this time.  Pls advise thanks allergic to NSAIDS Follow-up by: Vernie Murders,  December 21, 2009 3:58 PM  Additional Follow-up for Phone Call Additional follow up Details #1::        Suggest he repeat round of Z pak Additional Follow-up by: Waymon Budge MD,  December 21, 2009 4:11 PM    Additional Follow-up for Phone Call Additional follow up Details #2::    Rx was sent to pharm.  Spoke with pt and advised that this was done. Follow-up by: Vernie Murders,  December 21, 2009 4:16 PM  New/Updated Medications: ZITHROMAX Z-PAK 250 MG TABS (AZITHROMYCIN) take as directed Prescriptions: ZITHROMAX Z-PAK 250 MG TABS (AZITHROMYCIN) take as directed  #1 x 0   Entered by:   Vernie Murders   Authorized by:   Waymon Budge MD   Signed by:   Vernie Murders on 12/21/2009   Method used:   [facility]        [facility]  [address] [phone]* (retail)       [address]       [place], [place]  [address]       Ph: [phone] or [phone]       Fax: [phone]   RxID:   [id number]
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
## 2010-07-12 NOTE — Progress Notes (Signed)
Summary: Symbicort request  Phone Note Call from Patient Call back at Home Phone [phone]   Caller: Patient Call For: young Reason for Call: Refill Medication Summary of Call: Pt is having his PFT done today and request a RX for Symbicort; I see where you told him to start a sample  but no dose is metioned. Please advise if okay to give and which strength. Thanks.   Pharmacy: [facility] Initial call taken by: Reynaldo Minium CMA,  January 20, 2010 3:35 PM  Follow-up for Phone Call        Per CDY-ok to give RX for Symbicort 80/4.5 2 puffs two times a day and RINSE MOUTH # 1 with 6 refills.Reynaldo Minium CMA  January 20, 2010 5:01 PM    Pt aware RX sent.Reynaldo Minium CMA  January 20, 2010 5:01 PM     New/Updated Medications: SYMBICORT 80-4.5 MCG/ACT AERO (BUDESONIDE-FORMOTEROL FUMARATE) 2 puffs two times a day and RINSE MOUTH WEL Prescriptions: SYMBICORT 80-4.5 MCG/ACT AERO (BUDESONIDE-FORMOTEROL FUMARATE) 2 puffs two times a day and RINSE MOUTH WEL  #1 x 6   Entered by:   Reynaldo Minium CMA   Authorized by:   Waymon Budge MD   Signed by:   Reynaldo Minium CMA on 01/20/2010   Method used:   [facility]        [facility] [address] [phone]* (retail)       [address]       [place], [place]  [address]       Ph: [phone] or [phone]       Fax: [phone]   RxID:   [id number]
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
## 2010-07-12 NOTE — Progress Notes (Signed)
Summary: sick - pt given avelox, needs OV   Phone Note Call from Patient   Reason for Call: Acute Illness Summary of Call: c/o sinus congestion & green mucus x 4 ds, taking robitussin oTC, no fevers, on immunosuppressants Avelox x 7 ds pl make Appt with TP/ CY in am Initial call taken by: Comer Locket. Vassie Loll MD,  December 13, 2009 4:23 PM  Follow-up for Phone Call        pt's last appt w/ CDY was 10/2008.  is there a place that pt can be worked in? Boone Master CNA/MA  December 14, 2009 8:53 AM   Additional Follow-up for Phone Call Additional follow up Details #1::        Put pt in 415 slot be here at 4pm today.Reynaldo Minium CMA  December 14, 2009 8:56 AM   LMOM TCB. Boone Master CNA/MA  December 14, 2009 9:13 AM   pt returned call.  appt scheduled with CDY this afternoon, to arrive at 4pm  pt ok with this date and time. Boone Master CNA/MA  December 14, 2009 10:26 AM     New/Updated Medications: AVELOX 400 MG TABS (MOXIFLOXACIN HCL) once daily Prescriptions: AVELOX 400 MG TABS (MOXIFLOXACIN HCL) once daily  #7 x 0   Entered and Authorized by:   Comer Locket Vassie Loll MD   Signed by:   Comer Locket Vassie Loll MD on 12/13/2009   Method used:   [facility]        [facility] [address] [phone]* (retail)       [address]       [place], [place]  [address]       Ph: [phone] or [phone]       Fax: [phone]   RxID:   [id number]

## 2010-07-12 NOTE — Letter (Signed)
Summary: [facility]  [facility]   Imported By: Maryln Gottron 01/07/2010 10:42:49  _____________________________________________________________________  External Attachment:    Type:   Image     Comment:   External Document  Appended Document: [facility]    Clinical Lists Changes  Observations: Added new observation of PAST MED HX: Hermansky-Pudlak Syndrome- colitis, albinism, fibrosis, followed by [facility] renal PNEUMONIA (ICD-486) PAIN IN SOFT TISSUES OF LIMB?MUSCLE VS BONE? (ICD-729.5) ATRIAL ARRHYTHMIAS (ICD-427.89) ALLERGIC RHINITIS (ICD-477.9) ALLERGY (ICD-995.3) Eustachian tube dysfunction ANXIETY DISORDER, GENERALIZED (ICD-300.02) ACNE NEC DUE TO STEROIDS (ICD-706.1) ASTHMA, INTRINSIC NOS (ICD-493.10) OSTEOPENIA, MILD IN L/S REGION (ICD-733.90) ALBINISM (ICD-270.2) VISUAL ACUITY, DECREASED DUE TO ALBINISM (ICD-369.9) COLITIS, UNIVERSAL ULCERATIVE (ICD-556.6) ANEMIA, IRON DEFICIENCY NOS (ICD-280.9) INFLAMMATORY BOWEL DISEASE (HERMANSKY-PUDLAK SYNDR) (ICD-558.9) (01/08/2010 15:01)       Past History:  Past Medical History: Hermansky-Pudlak Syndrome- colitis, albinism, fibrosis, followed by [facility] renal PNEUMONIA (ICD-486) PAIN IN SOFT TISSUES OF LIMB?MUSCLE VS BONE? (ICD-729.5) ATRIAL ARRHYTHMIAS (ICD-427.89) ALLERGIC RHINITIS (ICD-477.9) ALLERGY (ICD-995.3) Eustachian tube dysfunction ANXIETY DISORDER, GENERALIZED (ICD-300.02) ACNE NEC DUE TO STEROIDS (ICD-706.1) ASTHMA, INTRINSIC NOS (ICD-493.10) OSTEOPENIA, MILD IN L/S REGION (ICD-733.90) ALBINISM (ICD-270.2) VISUAL ACUITY, DECREASED DUE TO ALBINISM (ICD-369.9) COLITIS, UNIVERSAL ULCERATIVE (ICD-556.6) ANEMIA, IRON DEFICIENCY NOS (ICD-280.9) INFLAMMATORY BOWEL DISEASE (HERMANSKY-PUDLAK SYNDR) (ICD-558.9)
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
## 2010-07-12 NOTE — Letter (Signed)
Summary: Nephrology/[facility]  Nephrology/[facility]   Imported By: Lester Winchester 05/14/2010 09:13:29  _____________________________________________________________________  External Attachment:    Type:   Image     Comment:   External Document
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
## 2010-07-12 NOTE — Letter (Signed)
Summary: Dr.Kim [facility],Note  Dr.Kim [facility],Note   Imported By: Beau Fanny 08/06/2009 16:39:08  _____________________________________________________________________  External Attachment:    Type:   Image     Comment:   External Document

## 2010-07-14 NOTE — Progress Notes (Signed)
Summary: sinus/ ears/ req to see dr young asap/appt scheduled  Phone Note Call from Patient Call back at Home Phone [phone]   Caller: Patient Call For: young Summary of Call: pt c/o sinus infection. also ear pressure. wants to be seen tomorrow morning before 11am. (so he can get transportation- he is visually impaired). ears starting to hurt- requests call back asap today.  Initial call taken by: Tivis Ringer, CNA,  May 23, 2010 4:53 PM  Follow-up for Phone Call        Pt c/o green/yellow discharge, pt scheduled on CY 11:15 am slot per Linus Galas,  told to arrive approx 10:45. Zackery Barefoot CMA  May 23, 2010 5:21 PM
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
## 2010-07-20 NOTE — Letter (Signed)
Summary: [facility]   Imported By: Lester Watertown 07/11/2010 10:41:09  _____________________________________________________________________  External Attachment:    Type:   Image     Comment:   External Document

## 2010-07-21 ENCOUNTER — Ambulatory Visit: Payer: Self-pay | Admitting: Internal Medicine

## 2010-07-21 ENCOUNTER — Ambulatory Visit: Payer: Self-pay | Admitting: Professional

## 2010-08-05 ENCOUNTER — Encounter (HOSPITAL_COMMUNITY): Payer: 59 | Attending: Oncology

## 2010-08-05 DIAGNOSIS — K5090 Crohn's disease, unspecified, without complications: Secondary | ICD-10-CM | POA: Insufficient documentation

## 2010-08-09 ENCOUNTER — Encounter (INDEPENDENT_AMBULATORY_CARE_PROVIDER_SITE_OTHER): Payer: 59

## 2010-08-09 ENCOUNTER — Telehealth: Payer: Self-pay | Admitting: Internal Medicine

## 2010-08-09 ENCOUNTER — Encounter: Payer: Self-pay | Admitting: Internal Medicine

## 2010-08-09 ENCOUNTER — Ambulatory Visit (INDEPENDENT_AMBULATORY_CARE_PROVIDER_SITE_OTHER): Payer: 59 | Admitting: Internal Medicine

## 2010-08-09 DIAGNOSIS — K5289 Other specified noninfective gastroenteritis and colitis: Secondary | ICD-10-CM

## 2010-08-09 DIAGNOSIS — J018 Other acute sinusitis: Secondary | ICD-10-CM

## 2010-08-09 DIAGNOSIS — R509 Fever, unspecified: Secondary | ICD-10-CM

## 2010-08-09 DIAGNOSIS — J45909 Unspecified asthma, uncomplicated: Secondary | ICD-10-CM

## 2010-08-11 ENCOUNTER — Telehealth: Payer: Self-pay | Admitting: Internal Medicine

## 2010-08-18 NOTE — Assessment & Plan Note (Signed)
Summary: pft charges   Allergies: 1)  ! Bl Aspirin 2)  ! Flagyl   Other Orders: Carbon Monoxide diffusing w/capacity (81191) Lung Volumes/Gas dilution or washout (47829) Spirometry (Pre & Post) [phone])

## 2010-08-18 NOTE — Progress Notes (Signed)
Summary: Triage  Phone Note Call from Patient Call back at Home Phone [phone]   Caller: Patient Call For: Dr. Juanda Chance Reason for Call: Talk to Nurse Summary of Call: Patient is Karens cousin and has the same "rare disease" that she does, he sees a GI doctor in [place] but he would like to also see Dr. Juanda Chance as a local GI doctor since she sees his cousin that has the same disease as him, he gets remicade at [facility] and would like to continue that but he needs a local doctor to give orders for that, you can reach the patient for further information and specifics on his situation, told him it would be Dr. Delia Chimes decision Initial call taken by: [place] Johnson,  August 09, 2010 12:55 PM  Follow-up for Phone Call        Message left for patient to call back. Jesse Fall, RN 08/09/10 2:09 PM Patient showed up in front office stating he was in the building so he came on up to discuss his needs. He has a  cousin Otho Najjar that sees Dr. Juanda Chance. He has the same rare disease that Clydie Braun has-Hermansky Pudlak syndrome. He sees Dr. Rosemarie Beath at [facility] as his GI MD. He is on Remicade and would like to get it a [facility] like he did 5 years ago. Dr. Truett Perna his hematologist ordered the Remicade before but does not want to do it this time. Patient wants Dr. Juanda Chance to be his "local GI" MD to write orders for the Remicade and keep seeing Dr. Reece Leader as his primary GI MD.  Please, advise. Follow-up by: Jesse Fall RN,  August 09, 2010 3:24 PM  Additional Follow-up for Phone Call Additional follow up Details #1::        I am sorry, I will not be able to do that. without being his primary GI doctor. Additional Follow-up by: Hart Carwin MD,  August 09, 2010 8:23 PM    Additional Follow-up for Phone Call Additional follow up Details #2::    Message left for patient to call back. Jesse Fall, RN 08/10/2010 10:28 AM   Appended Document: Triage Patient notified of Dr. Regino Schultze answer.

## 2010-08-18 NOTE — Progress Notes (Signed)
Summary: Remicade?   Phone Note Call from Patient Call back at [facility] Phone [phone]   Summary of Call: Please see phone note from GI - pt would like Dr Yetta Barre to order Remicade infusion b/c GI MD is out of town and can not order for pt here in [place]. Please advise.  Initial call taken by: Lamar Sprinkles, CMA,  August 11, 2010 7:17 PM  Follow-up for Phone Call        No, I am not able to do this Follow-up by: Etta Grandchild MD,  August 12, 2010 7:49 AM     Appended Document: Remicade?  left mess to call office back   Appended Document: Remicade?  Pt informed
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
## 2010-08-22 ENCOUNTER — Telehealth (INDEPENDENT_AMBULATORY_CARE_PROVIDER_SITE_OTHER): Payer: Self-pay | Admitting: *Deleted

## 2010-08-30 NOTE — Progress Notes (Signed)
Summary: Prescription cefdinir<<<called to pharmacy  Phone Note Call from Patient Call back at Home Phone [phone]   Caller: Patient Call For: Dr Maple Hudson Summary of Call: Patient has sinus infection and needs prescription for cefdinir to pick up tomorrow.  He is leaving town on Wednesday.  [facility] [address] at [phone]. Initial call taken by: Leonette Monarch,  August 22, 2010 4:07 PM  Follow-up for Phone Call        Spoke with pt and he states he has had head congestion, sinus pressure, and nasal congestion x 3-4 days.  pt states he is going out of town tomorrowe and is requesting a RX for cefdinir. please advise. Carron Curie CMA  August 22, 2010 4:47 PM   Additional Follow-up for Phone Call Additional follow up Details #1::        Ok to send cefdinir 300 mg, # 20, 2 daily x 10 days. Thanks. Additional Follow-up by: Waymon Budge MD,  August 22, 2010 5:11 PM    Additional Follow-up for Phone Call Additional follow up Details #2::    RX sent to pharmacy and detailed msg left for the patient. He may callback if he has any questions or problems.Michel Bickers CMA  August 22, 2010 5:23 PM  New/Updated Medications: CEFDINIR 300 MG CAPS (CEFDINIR) 2 by mouth daily x10 days Prescriptions: CEFDINIR 300 MG CAPS (CEFDINIR) 2 by mouth daily x10 days  #20 x 0   Entered by:   Michel Bickers CMA   Authorized by:   Waymon Budge MD   Signed by:   Michel Bickers CMA on 08/22/2010   Method used:   [facility]        [facility]  [address] [phone]* (retail)       [address]       [place], [place]  [address]       Ph: [phone] or [phone]       Fax: [phone]   RxID:   [id number]

## 2010-09-07 ENCOUNTER — Encounter (HOSPITAL_BASED_OUTPATIENT_CLINIC_OR_DEPARTMENT_OTHER): Payer: 59 | Admitting: Oncology

## 2010-09-07 DIAGNOSIS — K5289 Other specified noninfective gastroenteritis and colitis: Secondary | ICD-10-CM

## 2010-09-07 DIAGNOSIS — D509 Iron deficiency anemia, unspecified: Secondary | ICD-10-CM

## 2010-09-15 ENCOUNTER — Encounter (HOSPITAL_COMMUNITY): Payer: 59 | Attending: Oncology

## 2010-09-15 ENCOUNTER — Other Ambulatory Visit: Payer: Self-pay | Admitting: Gastroenterology

## 2010-09-15 DIAGNOSIS — K5090 Crohn's disease, unspecified, without complications: Secondary | ICD-10-CM | POA: Insufficient documentation

## 2010-09-15 LAB — COMPREHENSIVE METABOLIC PANEL
AST: 20 U/L (ref 0–37)
Albumin: 3.5 g/dL (ref 3.5–5.2)
Calcium: 8.8 mg/dL (ref 8.4–10.5)
Creatinine, Ser: 2.04 mg/dL — ABNORMAL HIGH (ref 0.4–1.5)
GFR calc Af Amer: 47 mL/min — ABNORMAL LOW (ref >60)
Total Protein: 7.7 g/dL (ref 6.0–8.3)

## 2010-09-15 LAB — URINALYSIS, ROUTINE W REFLEX MICROSCOPIC
Glucose, UA: NEGATIVE mg/dL
Ketones, ur: NEGATIVE mg/dL
Protein, ur: NEGATIVE mg/dL
Urobilinogen, UA: 0.2 mg/dL (ref 0.0–1.0)

## 2010-09-15 LAB — CBC
MCH: 26.3 pg (ref 26.0–34.0)
MCHC: 32.9 g/dL (ref 30.0–36.0)
Platelets: 174 10*3/uL (ref 150–400)
RBC: 4.75 MIL/uL (ref 4.22–5.81)

## 2010-10-20 ENCOUNTER — Telehealth: Payer: Self-pay | Admitting: Internal Medicine

## 2010-10-20 MED ORDER — CEFDINIR 300 MG PO CAPS: 300.0000 mg | ORAL_CAPSULE | Freq: Two times a day (BID) | ORAL | Status: AC

## 2010-10-20 NOTE — Telephone Encounter (Signed)
Per CDY, ok for omnicef 300mg  #20 1 bid x 10 days.    Called, spoke with pt.  He was informed to take omnicef 1 tablet bid x 10 days - [facility] [address].  Pt aware and verbalized understanding of this.

## 2010-10-20 NOTE — Telephone Encounter (Signed)
Spoke with pt.  He states has prod cough with yellow/green sputum and chest congestion- he started taking mucinex and increased fluids but still not helping. He has rheumacaid infusion scheduled for next wk and can not receive this with URI. Requesting omnicef. Pls advise thanks! Allergies  Allergen Reactions  . Metronidazole     REACTION: neuropathy

## 2010-10-25 ENCOUNTER — Telehealth: Payer: Self-pay | Admitting: Internal Medicine

## 2010-10-25 MED ORDER — MOXIFLOXACIN HCL 400 MG PO TABS: 400.0000 mg | ORAL_TABLET | Freq: Every day | ORAL | Status: AC

## 2010-10-25 NOTE — Discharge Summary (Signed)
[name], [name]            ACCOUNT NO.:  [id number]   MEDICAL RECORD NO.:  [id number]          PATIENT TYPE:  INP   LOCATION:  1513                         FACILITY:  [facility]   PHYSICIAN:  Valerie A. Felicity Coyer, MDDATE OF BIRTH:  20-Sep-1980   DATE OF ADMISSION:  06/07/2008  DATE OF DISCHARGE:  06/11/2008                               DISCHARGE SUMMARY   DISCHARGE DIAGNOSES:  1. Paroxysmal shortness of breath with reported nocturnal fevers, no      evidence of fever this hospitalization.  Continue empiric treatment      of below list of problems.  2. Left mastoid effusion.  No evidence for acute mastoiditis.      Outpatient followup with ENT as scheduled for further evaluation      and treatment.  3. GERD (gastroesophageal reflux disease) with severe reflux.      Increase proton pump inhibitor to b.i.d. with outpatient followup      at GI [facility] as previously scheduled.  4. Hermansky Pudlak syndrome.  Stable and at baseline.  No acute      flares.  5. Mild renal insufficiency, discharge creatinine 1.5.  6. Fatigue subacute for months, likely related to combination of the      above.  No acute abnormalities identified this hospitalization.  7. History of Crohn's disease, currently quiescent per GI physician at      [facility].  8. History of chronic sinusitis.  No evidence of inflammation or      active disease on CT sinuses this hospitalization.   DISCHARGE MEDICATIONS:  1. Include Avelox 100 mg daily x7 additional days to complete a total      10 day antibiotic fluoroquinolone course.  2. Flonase or other nasal steroid 2 sprays nostril q.a.m.  3. Nexium 40 mg now one p.o. b.i.d. previously once daily prior to      admission.  4. Mucinex 600 mg over-the-counter tablets 1 tablet p.o. b.i.d.      scheduled.  5. Albuterol 2.5 mg nebulizer q.4 h p.r.n. shortness of breath, home      nebulizer ordered.  6. Vicodin 5/500 one p.o. q.4 h p.r.n. pain.  7. Other medications  are as prior to admission and include Flagyl 500      mg once daily.  8. Vitamin D2 50,000 units weekly.  9. Vitamin D3 1000 units daily.  10.Vitamin C 1000 mg daily.  11.Tandem iron tablets once daily.  12.Cimzia IM q. 2 weeks as previously scheduled.   HOSPITAL CONSULTATIONS:  Consults this hospitalization include Pulmonary  [place] critical care Dr. Craige Cotta.   HOSPITAL FOLLOWUP:  Is with primary care physician, Dr. Laurita Quint,  scheduled for Monday, January 4 at 10:15 a.m. and also with ENT  physician, Dr. Annalee Genta, scheduled January 15 as previously arranged.  The patient is also instructed to follow up with his GI physician at  [facility] as well as Dr. Maple Hudson, pulmonologist, as previously scheduled  and as needed.   CONDITION ON DISCHARGE:  Medically stable, hemodynamically without  compromise and no evidence of fever or hypoxemia during this  hospitalization.  Overall condition  unchanged per patient.   HOSPITAL COURSE:  1. Shortness of breath with nocturnal fever.  The patient is a 30-year-      old gentleman with history of Hermansky Pudlak syndrome chronically      who has been on immunosuppressive therapy for over 4 years      currently with Cimzia who presents with a history of reported      nocturnal fevers and increasing fatigue.  Two days prior to      admission the fevers had been associated with increasing choking      and shortness of breath spells, uncontrollable coughing though      without sputum prompting emergency room evaluation.  Chest x-ray      was unremarkable and he did not have evidence of a fever in the      emergency room but given his immune suppression he was admitted for      evaluation of possible fever of unknown origin.  He reports concern      for possible tuberculosis and a PPD was placed which did have 4 mm      of induration by nursing report but no evidence of active TB with      negative chest x-ray and initial sputum AFB negative for  organisms      seen.  He also had diffuse cultures, blood cultures performed      December 27 and December 28 which have shown no growth.  A urine      culture performed December 28 was negative and respiratory culture      showed only normal oral pharyngeal flora.  He had no hypoxemia      associated with any of his episodes of shortness of breath and      coughing and his chest x-ray remained clear on three separate      occasions of evaluation this hospitalization.  Given his history of      sinusitis with complaints of pressure and drainage as well as left      ear pain he underwent a CT of the head as well as limited sinuses      which showed no evidence of paranasal sinus disease but did show a      moderate left effusion within the mastoid, however, no complicating      features of bony destruction or soft tissue inflammation.  The      patient has an outpatient appointment scheduled with ENT for      evaluation of same regarding decreased hearing and discomfort on      that left side and a telephone discussion and consultation was had      with Dr. Annalee Genta over the phone who agreed with the change of the      patient's antibiotics to continue with fluoroquinolone for 7-10      days as well as the addition of nasal steroid and outpatient      followup with him as scheduled for evaluation of possible ear tube      drainage and hearing evaluation.  This has been reviewed with the      patient who agrees.  We have also maximized the patient's reflux      medication treatment as he had described symptoms of reflux and      regurgitation of food particles with some of his episodes of      nocturnal shortness of breath and he is now on twice daily Nexium.  We have also tried to suppress cough regimen with scheduled Mucinex      in addition to these regimens.  The patient understands plans for      outpatient followup and lack of other definitive abnormal findings      during this  hospitalization and still has concerns regarding      possible tuberculosis and requests to see a pulmonary physician.      He will be seen in consultation by pulmonary physician prior to      discharge home and there being no other acute issues anticipated      will prepare for discharge home this afternoon if okay with      pulmonary service.  Close outpatient followup has been arranged      with the primary care physician soon after the New Year to monitor      his symptoms at home and also a home nebulizer machine to be used      p.r.n. as he reports this has improved his symptoms somewhat during      this hospitalization.  The patient had a normal CBC this      hospitalization with white count of 6.7, platelets of 193.  His      creatinine was minimally elevated at 1.57 but significantly      unchanged despite IV fluids and recommend outpatient followup on an      as-needed basis.  There appear to be no other acute issues      requiring ongoing further hospitalization during this time and the      patient is felt by me to be medically stable for discharge home      pending clearance by pulmonary today.   Please see hospital chart for further problems.  More than 30 minutes  spent on coordination of discharge planning today.      Valerie A. Felicity Coyer, MD  Electronically Signed     VAL/MEDQ  D:  06/11/2008  T:  06/11/2008  Job:  [id number]

## 2010-10-25 NOTE — Consult Note (Signed)
[name], [name]            ACCOUNT NO.:  [id number]   MEDICAL RECORD NO.:  [id number]          PATIENT TYPE:  INP   LOCATION:  1513                         FACILITY:  [facility]   PHYSICIAN:  Coralyn Helling, MD        DATE OF BIRTH:  [birth year]   DATE OF CONSULTATION:  06/11/2008  DATE OF DISCHARGE:                                 CONSULTATION   Mr. [name] is a 30 year old male who has a history of Hermansky-Pudlak  syndrome which consists of colitis, platelet function disorder, and  albinism.  He has been seen on an outpatient basis by Dr. Maple Hudson for  rhinosinusitis and seasonal allergic asthma.  He has been on several  different immunosuppressive agents for his underlying Hermansky-Pudlak  syndrome related to his colitis and platelet dysfunction.  He says that  for about 2 to 3 weeks prior to admission, he was having intermittent  episodes of fever up to 103 degrees Fahrenheit.  He checked this orally  at home.  He was attempting to have a referral to ENT but was not able  to get an appointment until some time in the middle of January.  He then  called his specialist in [place] and was prescribed an antibiotic  consisting of Omnicef.  However, his symptoms continued to get worse,  and as a result, he came the emergency room.  He said what really  prompted his visit to the emergency room with difficulty with his  breathing.  He was noted to be tachycardiac but his oxygenation was okay  on supplemental oxygen.  He was given intravenous fluids and started on  intravenous antibiotics.  Since that time his fever has improved.  He  still has sinus congestion with postnasal drip and he is still having  yellow discharge from his sinuses as well as a cough with production of  yellow sputum.  He denies any hemoptysis.  He says that he has had 2 to  3 episodes since his admission in which he feels like he has chest  tightness and cannot catch his breath.  He has been given nebulizer  treatment and he is not sure if this helps, but his symptoms do improve  about an hour after getting the nebulizer treatment.  He is not  currently having any problems with wheezing or chest tightness.  He  denies any abdominal pain, nausea, vomiting or diarrhea.  He has not had  any skin rashes.  He has not had any recent sick exposures.  He did have  a PPD placed which was reportedly 4 mm in diameter for induration.   PAST MEDICAL HISTORY:  1. Significant for Hermansky-Pudlak syndrome.  2. Chronic rhinosinusitis  3. Seasonal allergic rhinitis.  4. Crohn disease.  5. Gastroesophageal reflux disease.   OUTPATIENT MEDICATIONS:  1. Include and Nexium 40 mg daily.  2. Flagyl.  3. Vitamin D.  4. Guaifenesin.  5. Cimzia.   ALLERGY:  ASPIRIN which causes platelet dysfunction.   SOCIAL HISTORY:  He is married and has occasional alcohol use.  There is  no history of tobacco use.  FAMILY HISTORY:  Not significant.   PHYSICAL EXAM:  CONSTITUTIONAL:  He is seen in his hospital room.  He is  awake, alert, does not appear in acute distress.  He has absent  pigmentation is his hair, skin, and eyes.  VITAL SIGNS:  Blood pressure is 120/74, heart rate is 89, respiratory  rate is 20, temperature is 99.1, oxygen saturation 96 on room air.  HEENT:  There is no sinus tenderness.  There is clear to yellow nasal  discharge.  There is mild erythema of posterior pharynx.  There are no  oral lesions.  No lymphadenopathy, no thyromegaly.  No jugular venous  distension.  HEART:  S1, S2 regular rhythm.  CHEST:  Clear to auscultation.  ABDOMEN:  Soft, nontender.  Positive bowel sounds.  EXTREMITIES:  No edema, cyanosis, clubbing.  NEUROLOGIC:  He had normal strength.   DIAGNOSTIC STUDIES:  Hemoglobin 11.7, hematocrit is 35.2, platelet count  193,000.  WBCs 6.7.  Sodium is 135, potassium is 4, chloride is 105, CO2  is 24, BUN is 10, creatinine 1.57, glucose is 90.  AST is 19, ALT is 17,  ALP is 64,  bilirubin 0.6, albumin is 3, calcium is 8.7.  CT scan of the  sinuses showed no severe paranasal spinal disease.  Chest x-ray showed  no acute disease process.   IMPRESSION:  Acute sinusitis.  His fever curve has improved with his  current course of antibiotics, although he is still having some symptoms  of nasal congestion.  He has arrangements for ENT evaluation on January  15.   RECOMMENDATIONS:  1. He can keep this appointment.  Given the patients extensive history      of exposure to immunosuppressants, he is concerned about the      appropriate antibiotics and has requested that infectious disease      consult be requested.  I have relayed this information to Dr.      Felicity Coyer and she will make arrangements for this.  2. Asthma flare.  I suspect this is related to his sinus disease, and      with further therapy for his sinus disease, his asthma symptoms      should improve as well.  I do not think he needs systemic      corticosteroids for this.  I do not think he needs maintenance      inhalers at this time.  I will continue him on p.r.n. inhaler      therapy.  3. Fever.  I suspect this is related to his sinus disease.  He had a      PPD placed which was 4 mm.  I do not see any clinical evidence to      suggest pulmonary tuberculosis.  4. Hermansky-Pudlak syndrome.  I do not see any evidence for pulmonary      fibrosis on his chest x-ray and therefore I do not think there is      an indication for further imaging studies of his chest such as a      high-resolution CT scan.      Coralyn Helling, MD  Electronically Signed     VS/MEDQ  D:  06/11/2008  T:  06/11/2008  Job:  [id number]   cc:   Arta Silence, MD  Fax: [phone]   Leighton Roach. Truett Perna, M.D.  Fax: [phone]   Clinton D. Maple Hudson, MD, FCCP, FACP  [facility]-Pulmonary Dept  [address]  [place]  [place] [address]

## 2010-10-25 NOTE — Telephone Encounter (Signed)
Suggest he change antibiotic now to Avelox 400 mg, # 10, 1 daily, no ref.

## 2010-10-25 NOTE — Telephone Encounter (Signed)
Spoke with pt. He states that he has started round on omnicef on 5/10 and is still no better- c/o prod cough with yellow sputum and feels tight in his chest. Denies any CP, fever, SOB, wheeze. He wants to come in for appt but has no transportation and can not drive. Pls advise thanks! Allergies  Allergen Reactions  . Metronidazole     REACTION: neuropathy

## 2010-10-25 NOTE — H&P (Signed)
NAMECHALMERS, IDDINGS NO.:  [id number]   MEDICAL RECORD NO.:  [id number]          PATIENT TYPE:  EMS   LOCATION:  ED                           FACILITY[facility]  [facility]   PHYSICIAN:  Gardiner Barefoot, MD    DATE OF BIRTH:  1980/10/27   DATE OF ADMISSION:  06/07/2008  DATE OF DISCHARGE:                              HISTORY & PHYSICAL   PRIMARY CARE PHYSICIAN:  Dr. Laurita Quint of [place].   HEMATOLOGIST:  Dr. Thornton Papas.   PULMONOLOGIST:  Dr. Jetty Duhamel.   ENT:  Dr. Maurice March.   CHIEF COMPLAINT:  Fever.   HISTORY OF PRESENT ILLNESS:  This is a 30 year old male with a history  of Hermansky-Pudlak syndrome(colitis, albinism, pulmonary fibrosis,  platelet dysfunction) who has been on immunosuppressive therapy for  greater than 4 years, most recently with Cimzia, who presents with a 4-  week history of fever ranging between 99 to 103.  Patient reports nearly  daily, typically in the evening, he develops a fever and some chills  associated with it.  He also reports some periodic night sweats though  not daily and a clear productive cough, some green nasal congestion,  some ear plugging, also some urinary frequency and hesitancy.  Patient  also reports some significant fatigue for the last at least 6 months if  not longer and actually claims that he has some periods of energy but,  however, has had low energy, sleeping 12 hours per day, for over a year.  He has had some workups in the past for fevers and has been on multiple  courses of antibiotics in the past including doxycycline, rifampin,  linezolid, as well as others, however, and does note with rifampin he  did have some relief the last time he had a prolonged fever.  However,  this was in spite of the fact of not having a diagnosis of the cause of  the fever at that time.  Of note, he also reports some decreased  appetite.  He denies any travel outside of the country, however, he has  had recent travel to [place] and has been in large crowds recently.  He  does report having the seasonal flu shot, as well as H1N1 flu shot.  His  immunosuppressive regimen has in the past included Remicade and Humira.  He has most recently been seen by Dr. Maple Hudson who has performed PFTs with  a report of some mild deficiency in the DLCO.   PAST MEDICAL HISTORY:  1. Hermansky-Pudlak syndrome.  2. History of recurrent bronchitis or pharyngitis.  3. Crohn disease, now in remission per his gastroenterologist at [facility]      who has done a recent colonoscopy finding a remission.  4. A history of chronic sinusitis on a CT scan.  5. GERD.   MEDICATIONS:  Include:  1. Nexium 40 mg daily.  2. Flagyl 500 mg daily for his reflux symptoms.  3. Vitamin D2.  4. Guaifenesin p.r.n.  5. Cefdinir for the last 5 days.  6. Cimzia every Friday.   ALLERGIES:  ASPIRIN, SECONDARY TO HIS PLATELET DYSFUNCTION.  SOCIAL HISTORY:  The patient denies smoking, is married, and reports  occasional alcohol use.   FAMILY HISTORY:  Noncontributory.   REVIEW OF SYSTEMS:  Negative except as per the history of present  illness.   PHYSICAL EXAM:  VITALS:  Temperature is 98.5, pulse of 95, respirations  22, blood pressure 103/67, and O2 sats 100% on room air.  GENERAL:  The patient is awake, alert, and oriented x3 and appears in no  acute distress.  HEENT:  His bilateral ears, left greater than right, with fluid, no  erythema.  Throat with mildly enlarged tonsils with no erythema plus  anterior cervical lymphadenopathy.  Anicteric.  No maxillary sinus  tenderness.  NECK:  No meningismus.  CARDIOVASCULAR:  Regular rate and rhythm with no murmurs, rubs, or  gallops.  LUNGS:  Clear to auscultation bilaterally.  No rhonchi or wheezes.  ABDOMEN:  Soft, nontender, nondistended.  Positive bowel sounds and no  hepatosplenomegaly.  GU:  No penile lesions or lymphadenopathy.  EXTREMITIES:  No cyanosis, clubbing, or edema.  NEUROLOGIC:  Nonfocal,  intact.  SKIN:  Without rashes.   LABORATORY DATA:  Includes a WBC of 7.1, hemoglobin 13, platelets 215,  neutrophils 53%, sodium 135, potassium 3.6, chloride 102, bicarb 23, BUN  13, creatinine is 1.8, glucose 110.  Chest x-ray with no acute disease.   ASSESSMENT AND PLAN:  1. Fever of unknown origin.  Certainly, differential in somebody on      immunosuppressive therapy is very broad.  He does report having had      a PPD placed prior to starting immunosuppression which was      negative.  His symptoms are suggestive of either sinus disease with      the nasal discharge and the fluid in his middle ear versus urinary      symptoms secondary to his frequency and hesitancy.  Also, with      fever and chills, certainly could be bacteremia and with cough      pulmonary symptoms certainly are on the differential.  For now, we      will start with x-ray of the sinus to see if he continues to have      sinus disease.  Urinalysis with a culture.  Also, we will check      chlamydia and GC of the urinalysis to be sure it is not a sexually      transmitted disease.  Also, we will check blood cultures and place      PPD to be sure it is not tuberculosis.  Will also consider a CT      scan of the chest and abdomen if no other etiology is determined.      We will also hold on antibiotics at this time secondary to no      source identified in order to better characterize the cause of this      fever.  2. Prophylaxis.  Will continue with the patient's Flagyl and Nexium      for his gastroesophageal reflux disease.  Will      hold on any deep venous thrombosis prophylaxis as both his age does      not warrant the need for heparin or Lovenox and associated      underlying platelet disorder from his syndrome as well warrants      avoiding those.   This plan was discussed with patient, his wife, and his parents bedside  and they are in agreement with  this plan.      Gardiner Barefoot, MD   Electronically Signed     RWC/MEDQ  D:  06/08/2008  T:  06/08/2008  Job:  [id number]   cc:   Arta Silence, MD  Fax: [phone]

## 2010-10-25 NOTE — Telephone Encounter (Signed)
Pt aware Rx sent.  

## 2010-10-27 ENCOUNTER — Other Ambulatory Visit: Payer: Self-pay | Admitting: Gastroenterology

## 2010-10-27 ENCOUNTER — Encounter (HOSPITAL_COMMUNITY): Payer: 59 | Attending: Oncology

## 2010-10-27 DIAGNOSIS — K5090 Crohn's disease, unspecified, without complications: Secondary | ICD-10-CM | POA: Insufficient documentation

## 2010-10-27 LAB — CBC
HCT: 38.4 % — ABNORMAL LOW (ref 39.0–52.0)
MCHC: 33.1 g/dL (ref 30.0–36.0)
Platelets: 197 10*3/uL (ref 150–400)
RDW: 14.9 % (ref 11.5–15.5)
WBC: 4.4 10*3/uL (ref 4.0–10.5)

## 2010-10-27 LAB — COMPREHENSIVE METABOLIC PANEL
ALT: 17 U/L (ref 0–53)
Albumin: 3.6 g/dL (ref 3.5–5.2)
BUN: 17 mg/dL (ref 6–23)
Calcium: 8.5 mg/dL (ref 8.4–10.5)
Glucose, Bld: 114 mg/dL — ABNORMAL HIGH (ref 70–99)
Sodium: 136 mEq/L (ref 135–145)
Total Protein: 7.8 g/dL (ref 6.0–8.3)

## 2010-10-28 NOTE — Assessment & Plan Note (Signed)
[facility]                               PULMONARY OFFICE NOTE   NAME:[name]                   MRN:          [id number]  DATE:01/18/2006                            DOB:          July 09, 1980    PROBLEM LIST:  1. Hermansky-Pudlak syndrome (colitis, albinism, pulmonary fibrosis).  2. Periodic limb movement syndrome and poor sleep hygiene.  3. Recurrent tracheal bronchitis/pharyngitis.   HISTORY:  Last year, a nocturnal polysomnogram was done because his doctors  at [facility] obtained a history suspicious for sleep apnea.  This had  shown borderline abnormal sleep disordered breathing, mainly hypopneas  during REM with an AHI of 5.2 per hour which was not enough to push for  specific intervention.  He had normal oxygenation and normal heart rhythm  during that study.  He had a difficult winter and we have a series of phone  message reports since I last saw him in January.  He was having recurrent  sore throats, sinus congestion and malaise.  A monospot was negative.  He  had taken Avelox and then doxycycline and he said he finally ended up taking  about five different courses of antibiotics from his various doctors.   He went back to GI Clinic follow up at [facility].  He remembers that a  combination of Rifampin and doxycycline had helped for a diagnosis of  staphylococcus infection in the past.  He comes now saying that he finally  got better after the February illness.  He then went to [place] in  early June where he got GI upset and fatigue.  He considered a typical  flare.  He went for an early Remicade infusion which was temporarily  helpful.  He also had increase sinus congestion and on the recommendation of  his [facility] doctors went to an emergency room in [place].  He tried a  course of the Rifampin and doxycycline which was then changed to Zyvox.  He  remembers the Zyvox as causing some kind of bad feeling.  A  CT of his  sinuses was clear and a chest x-ray he says was negative.  He had persistent  abnormal sense of taste and through the summer his GI status he feels has  been a little unsettled.  Last week, he went to the emergency room at [facility] and ended up making two visits, including one very brief admission,  less than a day.  He had ENT evaluation at that time related to pain in the  ear.  He had rhinoscopy he understands was negative except that his larynx  was inflamed.  He got Solu-Medrol and then a prednisone taper which he  credits for resolution of the pain in his ear.  He is now being treated with  Humira instead of Remicade.  He denies dyspnea or chest pain.  Prevacid and  Zyrtec have been added to his medicine list.  He is pleased with a nasal  lavage system Simply Saline but complains about the expense.  We discussed  alternatives to sinus lavage techniques.   MEDICATIONS:  Remicade/Humira, Prevacid, Zyrtec.  He has Hydromet and the  Simply Saline nasal lavage.   ALLERGIES:  No medication allergies but he avoids aspirin and  nonsteroidal's.   OBJECTIVE:  Weight 195 pounds, blood pressure 106/74, pulse regular 105,  room air saturation 96%.  Somewhat congested sounding cough which is  nonproductive.  No wheeze or dullness.  Work of breathing is not increased.  There is some pharyngeal mucus which is clear.  Nasal mucosa is not  significantly edematous.  Nasal airway is unobstructed.  Heart sounds are  regular without murmurs.   IMPRESSION:  Recent exacerbation rhinitis or rhinosinusitis and bronchitis.   PLAN:  1. Try Xopenex nebulizer treatment 1.25 mg.  2. Add Symbicort 80/45 2 b.i.d.  3. Consider Singulair trial.  4. Schedule return one month, earlier p.r.n.  5. I would like him to have a PFT and we will call him about that.                                   Clinton D. Maple Hudson, MD, FCCP, FACP   CDY/MedQ  DD:  01/18/2006  DT:  01/19/2006  Job #:  [id number]    cc:   Leighton Roach. Truett Perna, MD  Arta Silence, MD  Fransisco Hertz, MD  Vania Rea. Jarold Motto, MD, [place], FACP  Cedric Fishman, M.D., PhD
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
## 2010-10-28 NOTE — Procedures (Signed)
NAME:  [name], [name]            ACCOUNT NO.:  [id number]   MEDICAL RECORD NO.:  [id number]          PATIENT TYPE:  OUT   LOCATION:  SLEEP CENTER                 FACILITY:  [facility]   PHYSICIAN:  Clinton D. Maple Hudson, M.D. DATE OF BIRTH:  20-Feb-1981   DATE OF STUDY:  08/25/2004                              NOCTURNAL POLYSOMNOGRAM   REFERRING PHYSICIAN:  Dr. Jetty Duhamel.   INDICATION FOR STUDY:  Hypersomnia with sleep apnea. Epworth sleepiness  score 12/24. BMI 25.9, weight 192 pounds.   SLEEP ARCHITECTURE:  Short total sleep time 171 minutes with sleep  efficiency 48%. Stage 1 was 9%, stage 2 39%, stages 3 and 4 were 31%, REM  was 20% of total sleep time. Sleep latency 11 minutes, REM latency 990  minutes, awake after sleep onset 42 minutes, arousal index increased at 36.  No sleep medications were taken. He achieved sleep at 11:04 p.m. Final  waking was at 2:30 a.m. The patient could not explain why he could not sleep  after that time with no specific complaint.   RESPIRATORY DATA:  Respiratory disturbance index (RDI) 5.2 obstructive  events per hour. This is at the upper limits of normal. There were 15  hypopneas. Events were noted while sleeping on his back. REM RDI was 5.1  indicating most events were limited to REM.   OXYGEN DATA:  Some snoring was noted. Oxygen saturation held within normal  with a mean oxygen saturation through the study of 96% on room air and  lowest recorded saturation 94%. An episode of epistaxis during the night was  stopped easily. He commented that he has occasional sinus drainage.   CARDIAC DATA:  Mild sinus arrhythmia.   MOVEMENT/PARASOMNIA:  A total of 56 limb jerks were recorded of which 19  were associated with arousal or awakening for a period limb movement with  arousal index of 6.6 per hour which is mildly increased.   IMPRESSION/RECOMMENDATION:  1.  Occasional sleep disordered breathing, mainly hypopneas during REM      sleep, respiratory  disturbance index 5.2 per hour which is at the upper      limits of normal, not indicating clinically significant obstructive      sleep apnea/hypopnea syndrome at this time.  2.  Normal oxygenation with observations of transient epistaxis and      complaint of postnasal drip.  3.  Sinus arrhythmia.  4.  Periodic limb movement with arousal 6.6 per hour.  5.  Nonspecific difficulty initiating and maintaining sleep which should be      discussed clinically.      CDY/MEDQ  D:  08/28/2004 14:06:03  T:  08/29/2004 11:50:50  Job:  [id number]

## 2010-11-24 ENCOUNTER — Encounter (HOSPITAL_COMMUNITY): Payer: 59 | Attending: Oncology

## 2010-11-24 ENCOUNTER — Other Ambulatory Visit: Payer: Self-pay | Admitting: Gastroenterology

## 2010-11-24 DIAGNOSIS — K5090 Crohn's disease, unspecified, without complications: Secondary | ICD-10-CM | POA: Insufficient documentation

## 2010-11-24 LAB — DIFFERENTIAL
Basophils Absolute: 0 10*3/uL (ref 0.0–0.1)
Basophils Relative: 1 % (ref 0–1)
Monocytes Absolute: 0.3 10*3/uL (ref 0.1–1.0)
Neutro Abs: 1.2 10*3/uL — ABNORMAL LOW (ref 1.7–7.7)
Neutrophils Relative %: 40 % — ABNORMAL LOW (ref 43–77)

## 2010-11-24 LAB — COMPREHENSIVE METABOLIC PANEL
ALT: 22 U/L (ref 0–53)
AST: 25 U/L (ref 0–37)
Albumin: 3.7 g/dL (ref 3.5–5.2)
Alkaline Phosphatase: 113 U/L (ref 39–117)
GFR calc Af Amer: 48 mL/min — ABNORMAL LOW (ref >60)
Glucose, Bld: 80 mg/dL (ref 70–99)
Potassium: 3.9 mEq/L (ref 3.5–5.1)
Sodium: 137 mEq/L (ref 135–145)
Total Protein: 8.8 g/dL — ABNORMAL HIGH (ref 6.0–8.3)

## 2010-11-24 LAB — CBC
Hemoglobin: 13.3 g/dL (ref 13.0–17.0)
MCHC: 33 g/dL (ref 30.0–36.0)
RBC: 5.1 MIL/uL (ref 4.22–5.81)

## 2010-12-09 ENCOUNTER — Ambulatory Visit (INDEPENDENT_AMBULATORY_CARE_PROVIDER_SITE_OTHER): Payer: 59 | Admitting: Internal Medicine

## 2010-12-09 ENCOUNTER — Encounter: Payer: Self-pay | Admitting: Internal Medicine

## 2010-12-09 VITALS — BP 112/80 | HR 117 | Ht 72.0 in | Wt 216.8 lb

## 2010-12-09 DIAGNOSIS — E70331 Hermansky-Pudlak syndrome: Secondary | ICD-10-CM

## 2010-12-09 DIAGNOSIS — J45909 Unspecified asthma, uncomplicated: Secondary | ICD-10-CM

## 2010-12-09 DIAGNOSIS — R509 Fever, unspecified: Secondary | ICD-10-CM

## 2010-12-09 MED ORDER — AZITHROMYCIN 250 MG PO TABS: ORAL_TABLET | ORAL | Status: AC

## 2010-12-09 NOTE — Progress Notes (Signed)
  Subjective:    Patient ID: [name], male    DOB: [birth year], 30 y.o.   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   Here with friend [name] Last here August 09, 2010 Currently says breathing is pretty good. Had temp 100 degrees x 1 week, sore throat, tender left cervical node. Had dieted and lost 40 lbs since off prednisone x 6 months. Now on 6-mercaptopurine (which drops WBC to 3000) and Remicade. These drugs are managed by Dr Sherrill/ Heme Onc and Dr Schooler/ GI. Denies wheeze, phlegm. Took oxycodone this AM for pains. For the past year has had some dry cough. Concerned about this as an early fibrosis marker. Wants PFT every 6 months. He plans to go( unscheduled so far) [facility] and [facility] so he asks not to do a cxr here so as to minimize radiation exposure.   Review of Systems Constitutional:  Weight is down since he has been off of prednisone.  No-, night sweats, chills, fatigue, lassitude. HEENT:   No headaches,  Difficulty swallowing,  Tooth/dental problems,  Sore throat,                No sneezing, itching, ear ache, nasal congestion, post nasal drip,      Vision poor c/w albinism.  CV:  No chest pain,  orthopnea, PND, swelling in lower extremities, anasarca, dizziness, palpitations  GI  No acute change in chronic GI concerns- heartburn, indigestion, abdominal pain, nausea, vomiting, diarrhea, change in bowel habits, loss of appetite   Followed by Dr Bosie Clos.  Resp: No shortness of breath with exertion or at rest.  No excess mucus, No coughing up of blood.  No change in color of mucus.  No wheezing.    Skin: no rash or lesions.  GU: no dysuria, change in color of urine, no urgency or frequency.  No flank pain.  MS:  No joint pain or swelling.  No decreased range of motion.  No back  pain.  Psych:  No change in mood or affect. No depression or anxiety.  No memory loss.      Objective:   Physical Exam General- Alert, Oriented, Affect-appropriate, Distress- none acute  Skin- rash-none, lesions- none, excoriation- none,    Albino   striae  Lymphadenopathy- none  Head- atraumatic  Eyes- Strabismus and squint, conjunctivae clear secretions  Ears- Hearing, canals, Tm - Normal  Nose- Clear, No- Septal dev, mucus, polyps, erosion, perforation   Throat- Mallampati III , mucosa clear- no exudate.  , drainage- none, tonsils- prominent without exudate  Neck- flexible , trachea midline, no stridor , thyroid nl, carotid no bruit  Chest - symmetrical excursion , unlabored     Heart/CV- RRR , no murmur , no gallop  , no rub, nl s1 s2                     - JVD- none , edema- none, stasis changes- none, varices- none     Lung- clear to P&A, wheeze- none, cough- none , dullness-none, rub- none     Chest wall-   Abd- tender-no, distended-no, bowel sounds-present, HSM- no  Br/ Gen/ Rectal- Not done, not indicated  Extrem- cyanosis- none, clubbing, none, atrophy- none, strength- nl  Neuro- grossly intact to observation        Assessment & Plan:

## 2010-12-09 NOTE — Assessment & Plan Note (Addendum)
Probable viral illness now with mild sore throat and fever but no other markers. We will give standby antibiotic as discussed, but hope to avoid use.

## 2010-12-09 NOTE — Patient Instructions (Signed)
Script to hold for Zpak to use if fever, sore throat or chest infection seem to require it.

## 2010-12-14 ENCOUNTER — Encounter: Payer: Self-pay | Admitting: Internal Medicine

## 2010-12-14 NOTE — Assessment & Plan Note (Addendum)
His fear is that every respiratory infection may start the inflammatory cascade with onset of interstitial fibrosis, especially since he remains on immunosupressive meds.

## 2010-12-22 ENCOUNTER — Other Ambulatory Visit: Payer: Self-pay | Admitting: Gastroenterology

## 2010-12-22 ENCOUNTER — Encounter (HOSPITAL_COMMUNITY): Payer: 59 | Attending: Oncology

## 2010-12-22 DIAGNOSIS — K5090 Crohn's disease, unspecified, without complications: Secondary | ICD-10-CM | POA: Insufficient documentation

## 2010-12-22 LAB — COMPREHENSIVE METABOLIC PANEL
ALT: 22 U/L (ref 0–53)
AST: 22 U/L (ref 0–37)
CO2: 24 mEq/L (ref 19–32)
Chloride: 103 mEq/L (ref 96–112)
Creatinine, Ser: 1.93 mg/dL — ABNORMAL HIGH (ref 0.50–1.35)
GFR calc non Af Amer: 41 mL/min — ABNORMAL LOW (ref >60)
Glucose, Bld: 93 mg/dL (ref 70–99)
Total Bilirubin: 0.4 mg/dL (ref 0.3–1.2)

## 2010-12-22 LAB — DIFFERENTIAL
Eosinophils Absolute: 0.1 10*3/uL (ref 0.0–0.7)
Lymphocytes Relative: 67 % — ABNORMAL HIGH (ref 12–46)
Lymphs Abs: 3.7 10*3/uL (ref 0.7–4.0)
Neutro Abs: 1.1 10*3/uL — ABNORMAL LOW (ref 1.7–7.7)
Neutrophils Relative %: 20 % — ABNORMAL LOW (ref 43–77)

## 2010-12-22 LAB — CBC
Hemoglobin: 12.5 g/dL — ABNORMAL LOW (ref 13.0–17.0)
MCV: 77.7 fL — ABNORMAL LOW (ref 78.0–100.0)
Platelets: 193 10*3/uL (ref 150–400)
RBC: 4.76 MIL/uL (ref 4.22–5.81)
WBC: 5.6 10*3/uL (ref 4.0–10.5)

## 2011-01-18 ENCOUNTER — Other Ambulatory Visit: Payer: Self-pay | Admitting: Oncology

## 2011-01-18 ENCOUNTER — Encounter (HOSPITAL_BASED_OUTPATIENT_CLINIC_OR_DEPARTMENT_OTHER): Payer: 59 | Admitting: Oncology

## 2011-01-18 DIAGNOSIS — K5289 Other specified noninfective gastroenteritis and colitis: Secondary | ICD-10-CM

## 2011-01-18 DIAGNOSIS — D509 Iron deficiency anemia, unspecified: Secondary | ICD-10-CM

## 2011-01-18 LAB — COMPREHENSIVE METABOLIC PANEL
ALT: 11 U/L (ref 0–53)
CO2: 20 mEq/L (ref 19–32)
Calcium: 8.8 mg/dL (ref 8.4–10.5)
Chloride: 107 mEq/L (ref 96–112)
Creatinine, Ser: 1.71 mg/dL — ABNORMAL HIGH (ref 0.50–1.35)
Glucose, Bld: 91 mg/dL (ref 70–99)
Total Protein: 7.9 g/dL (ref 6.0–8.3)

## 2011-01-18 LAB — CBC WITH DIFFERENTIAL/PLATELET
BASO%: 0.4 % (ref 0.0–2.0)
LYMPH%: 70.8 % — ABNORMAL HIGH (ref 14.0–49.0)
MCHC: 34.4 g/dL (ref 32.0–36.0)
MCV: 79.8 fL (ref 79.3–98.0)
MONO%: 14.3 % — ABNORMAL HIGH (ref 0.0–14.0)
Platelets: 171 10*3/uL (ref 140–400)
RBC: 4.8 10*6/uL (ref 4.20–5.82)
WBC: 5 10*3/uL (ref 4.0–10.3)

## 2011-01-18 LAB — TACROLIMUS LEVEL: Tacrolimus Lvl: 19.5 ng/mL (ref 5.0–20.0)

## 2011-01-18 LAB — MAGNESIUM: Magnesium: 1.5 mg/dL (ref 1.5–2.5)

## 2011-01-19 ENCOUNTER — Encounter (HOSPITAL_COMMUNITY): Payer: 59

## 2011-01-25 ENCOUNTER — Other Ambulatory Visit: Payer: Self-pay | Admitting: Oncology

## 2011-01-25 ENCOUNTER — Encounter (HOSPITAL_BASED_OUTPATIENT_CLINIC_OR_DEPARTMENT_OTHER): Payer: 59 | Admitting: Oncology

## 2011-01-25 DIAGNOSIS — K5289 Other specified noninfective gastroenteritis and colitis: Secondary | ICD-10-CM

## 2011-01-25 LAB — COMPREHENSIVE METABOLIC PANEL
CO2: 18 mEq/L — ABNORMAL LOW (ref 19–32)
Creatinine, Ser: 1.9 mg/dL — ABNORMAL HIGH (ref 0.50–1.35)
Glucose, Bld: 88 mg/dL (ref 70–99)
Total Bilirubin: 0.4 mg/dL (ref 0.3–1.2)
Total Protein: 7.9 g/dL (ref 6.0–8.3)

## 2011-01-25 LAB — CBC WITH DIFFERENTIAL/PLATELET
Eosinophils Absolute: 0.1 10*3/uL (ref 0.0–0.5)
HCT: 37.2 % — ABNORMAL LOW (ref 38.4–49.9)
LYMPH%: 64.4 % — ABNORMAL HIGH (ref 14.0–49.0)
MONO#: 0.6 10*3/uL (ref 0.1–0.9)
NEUT#: 1.1 10*3/uL — ABNORMAL LOW (ref 1.5–6.5)
NEUT%: 21.2 % — ABNORMAL LOW (ref 39.0–75.0)
Platelets: 162 10*3/uL (ref 140–400)
WBC: 5.2 10*3/uL (ref 4.0–10.3)

## 2011-01-25 LAB — TACROLIMUS LEVEL: Tacrolimus Lvl: 19.1 ng/mL (ref 5.0–20.0)

## 2011-01-25 LAB — MAGNESIUM: Magnesium: 1.4 mg/dL — ABNORMAL LOW (ref 1.5–2.5)

## 2011-02-01 ENCOUNTER — Other Ambulatory Visit: Payer: Self-pay | Admitting: Oncology

## 2011-02-01 ENCOUNTER — Encounter (HOSPITAL_BASED_OUTPATIENT_CLINIC_OR_DEPARTMENT_OTHER): Payer: 59 | Admitting: Oncology

## 2011-02-01 DIAGNOSIS — D689 Coagulation defect, unspecified: Secondary | ICD-10-CM

## 2011-02-01 DIAGNOSIS — K5289 Other specified noninfective gastroenteritis and colitis: Secondary | ICD-10-CM

## 2011-02-01 DIAGNOSIS — D509 Iron deficiency anemia, unspecified: Secondary | ICD-10-CM

## 2011-02-01 LAB — CBC WITH DIFFERENTIAL/PLATELET
BASO%: 0.3 % (ref 0.0–2.0)
EOS%: 1.3 % (ref 0.0–7.0)
HCT: 36.4 % — ABNORMAL LOW (ref 38.4–49.9)
LYMPH%: 62.3 % — ABNORMAL HIGH (ref 14.0–49.0)
MCH: 27.3 pg (ref 27.2–33.4)
MCHC: 34.2 g/dL (ref 32.0–36.0)
NEUT%: 21.6 % — ABNORMAL LOW (ref 39.0–75.0)
RBC: 4.58 10*6/uL (ref 4.20–5.82)
lymph#: 3.1 10*3/uL (ref 0.9–3.3)

## 2011-02-01 LAB — COMPREHENSIVE METABOLIC PANEL
ALT: 9 U/L (ref 0–53)
AST: 15 U/L (ref 0–37)
Chloride: 107 mEq/L (ref 96–112)
Creatinine, Ser: 2.46 mg/dL — ABNORMAL HIGH (ref 0.50–1.35)
Total Bilirubin: 0.5 mg/dL (ref 0.3–1.2)

## 2011-02-09 ENCOUNTER — Other Ambulatory Visit: Payer: Self-pay | Admitting: Oncology

## 2011-02-09 ENCOUNTER — Encounter (HOSPITAL_BASED_OUTPATIENT_CLINIC_OR_DEPARTMENT_OTHER): Payer: 59 | Admitting: Oncology

## 2011-02-09 DIAGNOSIS — D509 Iron deficiency anemia, unspecified: Secondary | ICD-10-CM

## 2011-02-09 DIAGNOSIS — K5289 Other specified noninfective gastroenteritis and colitis: Secondary | ICD-10-CM

## 2011-02-09 LAB — COMPREHENSIVE METABOLIC PANEL
ALT: 10 U/L (ref 0–53)
AST: 13 U/L (ref 0–37)
Albumin: 3.9 g/dL (ref 3.5–5.2)
Alkaline Phosphatase: 76 U/L (ref 39–117)
BUN: 27 mg/dL — ABNORMAL HIGH (ref 6–23)
Chloride: 109 mEq/L (ref 96–112)
Potassium: 3.6 mEq/L (ref 3.5–5.3)
Sodium: 139 mEq/L (ref 135–145)
Total Protein: 7.7 g/dL (ref 6.0–8.3)

## 2011-02-09 LAB — MAGNESIUM: Magnesium: 1.4 mg/dL — ABNORMAL LOW (ref 1.5–2.5)

## 2011-02-09 LAB — CBC WITH DIFFERENTIAL/PLATELET
BASO%: 0.3 % (ref 0.0–2.0)
EOS%: 1 % (ref 0.0–7.0)
MCH: 27.4 pg (ref 27.2–33.4)
MCHC: 34.5 g/dL (ref 32.0–36.0)
MCV: 79.4 fL (ref 79.3–98.0)
MONO%: 13 % (ref 0.0–14.0)
RBC: 4.74 10*6/uL (ref 4.20–5.82)
RDW: 14.6 % (ref 11.0–14.6)
lymph#: 3.4 10*3/uL — ABNORMAL HIGH (ref 0.9–3.3)

## 2011-02-09 LAB — TACROLIMUS LEVEL: Tacrolimus Lvl: 7.6 ng/mL (ref 5.0–20.0)

## 2011-02-10 ENCOUNTER — Ambulatory Visit: Payer: 59 | Admitting: Internal Medicine

## 2011-02-22 ENCOUNTER — Other Ambulatory Visit: Payer: Self-pay | Admitting: Oncology

## 2011-02-22 ENCOUNTER — Encounter (HOSPITAL_BASED_OUTPATIENT_CLINIC_OR_DEPARTMENT_OTHER): Payer: 59 | Admitting: Oncology

## 2011-02-22 DIAGNOSIS — D509 Iron deficiency anemia, unspecified: Secondary | ICD-10-CM

## 2011-02-22 DIAGNOSIS — K5289 Other specified noninfective gastroenteritis and colitis: Secondary | ICD-10-CM

## 2011-02-22 LAB — COMPREHENSIVE METABOLIC PANEL
AST: 15 U/L (ref 0–37)
Albumin: 4.2 g/dL (ref 3.5–5.2)
BUN: 21 mg/dL (ref 6–23)
CO2: 22 mEq/L (ref 19–32)
Calcium: 8.9 mg/dL (ref 8.4–10.5)
Chloride: 106 mEq/L (ref 96–112)
Potassium: 4.3 mEq/L (ref 3.5–5.3)

## 2011-02-22 LAB — CBC WITH DIFFERENTIAL/PLATELET
Basophils Absolute: 0 10*3/uL (ref 0.0–0.1)
Eosinophils Absolute: 0.1 10*3/uL (ref 0.0–0.5)
HCT: 40.6 % (ref 38.4–49.9)
HGB: 13.8 g/dL (ref 13.0–17.1)
MCV: 80.1 fL (ref 79.3–98.0)
NEUT#: 1.5 10*3/uL (ref 1.5–6.5)
RDW: 14.4 % (ref 11.0–14.6)
lymph#: 2.7 10*3/uL (ref 0.9–3.3)

## 2011-02-22 LAB — MAGNESIUM: Magnesium: 1.8 mg/dL (ref 1.5–2.5)

## 2011-03-08 ENCOUNTER — Other Ambulatory Visit: Payer: Self-pay | Admitting: Oncology

## 2011-03-08 ENCOUNTER — Encounter (HOSPITAL_BASED_OUTPATIENT_CLINIC_OR_DEPARTMENT_OTHER): Payer: 59 | Admitting: Oncology

## 2011-03-08 DIAGNOSIS — K5289 Other specified noninfective gastroenteritis and colitis: Secondary | ICD-10-CM

## 2011-03-08 DIAGNOSIS — D509 Iron deficiency anemia, unspecified: Secondary | ICD-10-CM

## 2011-03-08 LAB — COMPREHENSIVE METABOLIC PANEL
ALT: 16 U/L (ref 0–53)
AST: 19 U/L (ref 0–37)
Albumin: 4.1 g/dL (ref 3.5–5.2)
Alkaline Phosphatase: 79 U/L (ref 39–117)
Potassium: 4 mEq/L (ref 3.5–5.3)
Sodium: 138 mEq/L (ref 135–145)
Total Protein: 7.8 g/dL (ref 6.0–8.3)

## 2011-03-08 LAB — CBC WITH DIFFERENTIAL/PLATELET
EOS%: 1.9 % (ref 0.0–7.0)
Eosinophils Absolute: 0.1 10*3/uL (ref 0.0–0.5)
MCH: 26.9 pg — ABNORMAL LOW (ref 27.2–33.4)
MCV: 79.3 fL (ref 79.3–98.0)
MONO%: 13.7 % (ref 0.0–14.0)
NEUT#: 1.8 10*3/uL (ref 1.5–6.5)
RBC: 4.9 10*6/uL (ref 4.20–5.82)
RDW: 13.5 % (ref 11.0–14.6)
lymph#: 2.4 10*3/uL (ref 0.9–3.3)

## 2011-03-08 LAB — MAGNESIUM: Magnesium: 1.5 mg/dL (ref 1.5–2.5)

## 2011-03-16 LAB — FUNGUS CULTURE W SMEAR: Fungal Smear: NONE SEEN

## 2011-03-16 LAB — COMPREHENSIVE METABOLIC PANEL
AST: 19 U/L (ref 0–37)
BUN: 13 mg/dL (ref 6–23)
CO2: 26 mEq/L (ref 19–32)
Calcium: 8.4 mg/dL (ref 8.4–10.5)
Creatinine, Ser: 1.64 mg/dL — ABNORMAL HIGH (ref 0.4–1.5)
GFR calc Af Amer: >60 mL/min (ref >60)
GFR calc non Af Amer: 51 mL/min — ABNORMAL LOW (ref >60)
Total Bilirubin: 0.6 mg/dL (ref 0.3–1.2)

## 2011-03-16 LAB — CBC
HCT: 37.2 % — ABNORMAL LOW (ref 39.0–52.0)
MCHC: 33.2 g/dL (ref 30.0–36.0)
MCHC: 33.5 g/dL (ref 30.0–36.0)
MCV: 80 fL (ref 78.0–100.0)
MCV: 81 fL (ref 78.0–100.0)
Platelets: 193 10*3/uL (ref 150–400)
Platelets: 200 10*3/uL (ref 150–400)
Platelets: 215 10*3/uL (ref 150–400)
RBC: 4.4 MIL/uL (ref 4.22–5.81)
RBC: 4.59 MIL/uL (ref 4.22–5.81)
RBC: 4.84 MIL/uL (ref 4.22–5.81)
WBC: 6.7 10*3/uL (ref 4.0–10.5)

## 2011-03-16 LAB — HEPATIC FUNCTION PANEL
ALT: 20 U/L (ref 0–53)
AST: 23 U/L (ref 0–37)
Albumin: 3.3 g/dL — ABNORMAL LOW (ref 3.5–5.2)
Alkaline Phosphatase: 75 U/L (ref 39–117)
Bilirubin, Direct: 0.1 mg/dL (ref 0.0–0.3)
Indirect Bilirubin: 0.4 mg/dL (ref 0.3–0.9)
Total Bilirubin: 0.5 mg/dL (ref 0.3–1.2)
Total Protein: 8.1 g/dL (ref 6.0–8.3)

## 2011-03-16 LAB — CULTURE, BLOOD (ROUTINE X 2): Culture: NO GROWTH

## 2011-03-16 LAB — URINE CULTURE
Colony Count: NO GROWTH
Culture: NO GROWTH

## 2011-03-16 LAB — BASIC METABOLIC PANEL
BUN: 10 mg/dL (ref 6–23)
BUN: 13 mg/dL (ref 6–23)
CO2: 23 mEq/L (ref 19–32)
Calcium: 8.7 mg/dL (ref 8.4–10.5)
Calcium: 9 mg/dL (ref 8.4–10.5)
Chloride: 105 mEq/L (ref 96–112)
Creatinine, Ser: 1.57 mg/dL — ABNORMAL HIGH (ref 0.4–1.5)
Creatinine, Ser: 1.68 mg/dL — ABNORMAL HIGH (ref 0.4–1.5)
GFR calc Af Amer: >60 mL/min (ref >60)
GFR calc Af Amer: >60 mL/min (ref >60)

## 2011-03-16 LAB — URINALYSIS, ROUTINE W REFLEX MICROSCOPIC
Nitrite: NEGATIVE
Specific Gravity, Urine: 1.002 — ABNORMAL LOW (ref 1.005–1.030)
Urobilinogen, UA: 0.2 mg/dL (ref 0.0–1.0)

## 2011-03-16 LAB — DIFFERENTIAL
Basophils Absolute: 0 10*3/uL (ref 0.0–0.1)
Basophils Absolute: 0 10*3/uL (ref 0.0–0.1)
Basophils Relative: 0 % (ref 0–1)
Eosinophils Relative: 2 % (ref 0–5)
Lymphocytes Relative: 45 % (ref 12–46)
Lymphs Abs: 3.1 10*3/uL (ref 0.7–4.0)
Monocytes Relative: 12 % (ref 3–12)
Neutro Abs: 2.7 10*3/uL (ref 1.7–7.7)
Neutro Abs: 3.8 10*3/uL (ref 1.7–7.7)
Neutrophils Relative %: 38 % — ABNORMAL LOW (ref 43–77)
Neutrophils Relative %: 53 % (ref 43–77)

## 2011-03-16 LAB — GC/CHLAMYDIA PROBE AMP, URINE
Chlamydia, Swab/Urine, PCR: NEGATIVE
GC Probe Amp, Urine: NEGATIVE

## 2011-03-16 LAB — CULTURE, RESPIRATORY W GRAM STAIN: Culture: NORMAL

## 2011-03-16 LAB — EXPECTORATED SPUTUM ASSESSMENT W GRAM STAIN, RFLX TO RESP C

## 2011-03-16 LAB — URINE MICROSCOPIC-ADD ON: Urine-Other: NONE SEEN

## 2011-06-02 ENCOUNTER — Telehealth: Payer: Self-pay | Admitting: Internal Medicine

## 2011-06-02 MED ORDER — CEFDINIR 300 MG PO CAPS: 300.0000 mg | ORAL_CAPSULE | Freq: Two times a day (BID) | ORAL | Status: AC

## 2011-06-02 NOTE — Telephone Encounter (Signed)
I spoke with pt and he c/o nasal congestion, blowing out yellow phlem, PND, ears feels clogged x couple days. Pt started to have diarrhea today. Pt denies any fever, nausea, vomiting. Pt requesting to have cefdinir called in. Please advise Dr. Maple Hudson, thanks  Allergies  Allergen Reactions  . Metronidazole     REACTION: neuropathy     [facility]

## 2011-06-02 NOTE — Telephone Encounter (Signed)
Per CY-okay to give Cefdinir 300 mg #14 take 1 po bid no refills; left message that Rx was [facility] to [facility] [address]. If any questions or concerns please call the office.

## 2011-07-21 ENCOUNTER — Telehealth: Payer: Self-pay | Admitting: Internal Medicine

## 2011-07-21 MED ORDER — AZITHROMYCIN 250 MG PO TABS: ORAL_TABLET | ORAL | Status: AC

## 2011-07-21 NOTE — Telephone Encounter (Signed)
Per CY-offer Zpak #1 take as directed no refills.  

## 2011-07-21 NOTE — Telephone Encounter (Signed)
Pt called back and he is aware that the [facility] [place].

## 2011-07-21 NOTE — Telephone Encounter (Signed)
I spoke with pt and he c/o cough w/ yellow phlem, nasal congestion, large amounts of PND, blowing out yellow phlem, sore throat x 3 weeks. Denies any chills, sweats, nausea, vomiting, fever. Pt is taking mucinex D. Pt requesting further recs from CDY. Please advise thanks  Allergies  Allergen Reactions  . Metronidazole     REACTION: neuropathy     [facility] [place]

## 2011-07-21 NOTE — Telephone Encounter (Signed)
lmomtcb x 1.  [facility] [place]. Need to notify patient.

## 2011-08-02 ENCOUNTER — Telehealth: Payer: Self-pay | Admitting: Internal Medicine

## 2011-08-02 MED ORDER — CLARITHROMYCIN 500 MG PO TABS: 500.0000 mg | ORAL_TABLET | Freq: Two times a day (BID) | ORAL | Status: AC

## 2011-08-02 NOTE — Telephone Encounter (Signed)
Called spoke with patient who reported that his sinus infection improved greatly with the last zpak rx given on 2.8.13 but as soon as he finished it, he symptoms began returning.  C/o sore throat, PND, sinus pressure x2-3days.  Denies wheezing, SOB, mucus production.  Requesting rx be sent to [facility] this one time.  Dr Maple Hudson please advise, thanks.  Allergies  Allergen Reactions  . Metronidazole     REACTION: neuropathy

## 2011-08-02 NOTE — Telephone Encounter (Signed)
Per CY-okay to give Biaxin 500 mg #20 take 1 po bid after meal no refills. Rx sent [facility] per patient.

## 2011-11-28 ENCOUNTER — Telehealth: Payer: Self-pay | Admitting: Internal Medicine

## 2011-11-28 NOTE — Telephone Encounter (Signed)
Spoke with patient; he is trying to get digital records from [facility] to bring with him on Thrursday 11-30-11 at 315pm. Pt will call to [facility] and let me know if he is unable to records by Thursday.

## 2011-11-30 ENCOUNTER — Encounter: Payer: Self-pay | Admitting: Internal Medicine

## 2011-11-30 ENCOUNTER — Ambulatory Visit (INDEPENDENT_AMBULATORY_CARE_PROVIDER_SITE_OTHER): Payer: 59 | Admitting: Internal Medicine

## 2011-11-30 VITALS — BP 116/78 | HR 114 | Ht 72.0 in | Wt 197.4 lb

## 2011-11-30 DIAGNOSIS — J8410 Pulmonary fibrosis, unspecified: Secondary | ICD-10-CM

## 2011-11-30 DIAGNOSIS — J189 Pneumonia, unspecified organism: Secondary | ICD-10-CM

## 2011-11-30 DIAGNOSIS — J849 Interstitial pulmonary disease, unspecified: Secondary | ICD-10-CM

## 2011-11-30 DIAGNOSIS — K5100 Ulcerative (chronic) pancolitis without complications: Secondary | ICD-10-CM

## 2011-11-30 MED ORDER — FLUTTER DEVI: Status: DC

## 2011-11-30 MED ORDER — BUDESONIDE-FORMOTEROL FUMARATE 80-4.5 MCG/ACT IN AERO: 2.0000 | INHALATION_SPRAY | Freq: Two times a day (BID) | RESPIRATORY_TRACT | Status: DC

## 2011-11-30 MED ORDER — ALBUTEROL SULFATE HFA 108 (90 BASE) MCG/ACT IN AERS: 2.0000 | INHALATION_SPRAY | Freq: Four times a day (QID) | RESPIRATORY_TRACT | Status: DC | PRN

## 2011-11-30 NOTE — Progress Notes (Signed)
Subjective:    Patient ID: [name], male    DOB: Jul 15, 1980, 31 y.o.   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   Here with friend [name] Last here August 09, 2010 Currently says breathing is pretty good. Had temp 100 degrees x 1 week, sore throat, tender left cervical node. Had dieted and lost 40 lbs since off prednisone x 6 months. Now on 6-mercaptopurine (which drops WBC to 3000) and Remicade. These drugs are managed by Dr Sherrill/ Heme Onc and Dr Schooler/ GI. Denies wheeze, phlegm. Took oxycodone this AM for pains. For the past year has had some dry cough. Concerned about this as an early fibrosis marker. Wants PFT every 6 months. He plans to go( unscheduled so far) for CXR and [facility] so he asks not to do a cxr here so as to minimize radiation exposure.    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   Since last here he had a total colectomy in 2012. Had ER visit at [facility] 03/24/2011 and CT scan suggested "something in lung" which he wanted to review. Treated with Levaquin. Has also had ER visit for kidney stone. Has stage III renal disease with 20% function-not known why. CXR 11/26/11- he reports "much clearer". Images were available on discs he brought from [facility]. We reviewed the images together. It looks as if he was dealing with a pneumonia in October with some residual density-pneumonia versus atelectasis-on the recent chest x-ray. He has minimal cough and no acute chest symptoms. Some of his interstitial markings may have been a little prominent but there was no dramatic interstitial fibrosis.  ROS-see HPI Constitutional:   No-   weight loss, night  sweats, fevers, chills, +fatigue, lassitude. HEENT:   No-  headaches, difficulty swallowing, tooth/dental problems, sore throat,       No-  sneezing, itching, ear ache, nasal congestion, post nasal drip,  CV:  No-   chest pain, orthopnea, PND, swelling in lower extremities, anasarca, dizziness, palpitations Resp: + shortness of breath with exertion or at rest.              No-   productive cough,  + non-productive cough,  No- coughing up of blood.              No-   change in color of mucus.  No- wheezing.   Skin: No-   rash or lesions. GI:  No-   heartburn, indigestion, abdominal pain, nausea, vomiting,  GU:  MS:  No-   joint pain or swelling.   Neuro-     nothing unusual Psych:  No- change in mood or affect. No depression or anxiety.  No memory loss.  OBJ- Physical Exam General- Alert, Oriented, Affect-appropriate, Distress- none acute, albino, trim Skin- rash-none, lesions- none, excoriation- none Lymphadenopathy- none Head- atraumatic            Eyes-  PERRLA, conjunctivae and secretions clear. Strabismus, nystagmus            Ears- Hearing, canals-normal            Nose- Clear, no-Septal dev, mucus, polyps, erosion, perforation             Throat- Mallampati II , mucosa clear , drainage- none, tonsils- atrophic Neck- flexible ,  trachea midline, no stridor , thyroid nl, carotid no bruit Chest - symmetrical excursion , unlabored           Heart/CV- RRR , no murmur , no gallop  , no rub, nl s1 s2                           - JVD- none , edema- none, stasis changes- none, varices- none           Lung- clear to P&A, wheeze- none, cough- none , dullness-none, rub- none           Chest wall-  Abd-  Gen/ Rectal- Not done, not indicated Extrem- cyanosis- none, clubbing, none, atrophy- none, strength- nl Neuro- grossly intact to observation except nystagmus

## 2011-11-30 NOTE — Patient Instructions (Addendum)
Scripts sent for Symbicort and Engineer, structural for Flutter  Order PFT   Dx   interstitial lung disease

## 2011-12-05 ENCOUNTER — Telehealth: Payer: Self-pay | Admitting: Internal Medicine

## 2011-12-05 MED ORDER — AMOXICILLIN-POT CLAVULANATE 875-125 MG PO TABS: 1.0000 | ORAL_TABLET | Freq: Two times a day (BID) | ORAL | Status: DC

## 2011-12-05 NOTE — Telephone Encounter (Signed)
Per CY-okay to give Augmentin 875 mg #14 take 1 po bid no refills.  

## 2011-12-05 NOTE — Telephone Encounter (Signed)
I spoke with pt and is aware of [facility] recs. Rx has been called into the pharmacy

## 2011-12-05 NOTE — Telephone Encounter (Signed)
Called, spoke with pt.  He was seen on 11/30/11 by Dr. Maple Hudson and was seen in [facility] ED the Sunday prior to being seen by CD[facility].  Pt states he has taken at least 7 days of levaquin with no relief.  Reports he does have chest congestion, sinus drainage, and prod cough with yellow mucus.  Also, states last night he started having chills and low grade fever of 100.  Denies increased SOB, wheezing, chest tightness, or chest pain.  Requesting another abx.  Dr. Maple Hudson, pls advise.  Thank you.  [facility]  Allergies verified:  Allergies  Allergen Reactions  . Metronidazole     REACTION: neuropathy

## 2011-12-08 DIAGNOSIS — J189 Pneumonia, unspecified organism: Secondary | ICD-10-CM | POA: Insufficient documentation

## 2011-12-08 NOTE — Assessment & Plan Note (Addendum)
Pneumonia treated with Levaquin 2012 Residual or recurrent right lower lobe pneumonia as of chest x-ray 11/26/2011, without fever or purulent sputum. Plan-flutter valve for pulmonary toilet. Refill Symbicort and pro air. Finish current Levaquin

## 2011-12-08 NOTE — Assessment & Plan Note (Signed)
Recognize colectomy may impact absorption of oral medications including antibiotics

## 2011-12-27 ENCOUNTER — Telehealth: Payer: Self-pay | Admitting: Internal Medicine

## 2011-12-27 MED ORDER — LEVOFLOXACIN 750 MG PO TABS: 750.0000 mg | ORAL_TABLET | Freq: Every day | ORAL | Status: AC

## 2011-12-27 NOTE — Telephone Encounter (Signed)
Called and spoke with patient who is having several days (x4) of not feeling well. Pt reports vomiting was first symptom, which has progressed to congestion, productive cough with greenish yellow sputum from mouth and nose.  Patient informs me that he feels like he may have rotovirus as that he has had before (june?) and also his children recently had the virus as well.  Patient reports he has only taken tylenol and guafenisen. Patient is currently on immunosuppressants, worries about bacterial infection and says he is usually treated with antibiotics for these type systems.  Patient would like something called into pharmacy, transportation is currently an issue.   As Dr. Maple Hudson is unavailable will send to [facility]--Dr Sherene Sires, please advise! Thank You, Leotis Shames

## 2011-12-27 NOTE — Telephone Encounter (Signed)
Since just took augmentin recently choices are levaquin 750 one daily x 5 days or go to UC or ER if condition worsens

## 2011-12-27 NOTE — Telephone Encounter (Signed)
Pt is aware of MW recs. Rx has been sent to the pharmacy and will forward to CDY as an [place]

## 2012-01-11 ENCOUNTER — Ambulatory Visit: Payer: 59 | Admitting: Internal Medicine

## 2012-01-24 ENCOUNTER — Ambulatory Visit: Payer: Self-pay | Admitting: Pain Medicine

## 2012-02-26 ENCOUNTER — Telehealth: Payer: Self-pay | Admitting: Internal Medicine

## 2012-02-26 ENCOUNTER — Encounter: Payer: Self-pay | Admitting: Internal Medicine

## 2012-02-26 ENCOUNTER — Ambulatory Visit (INDEPENDENT_AMBULATORY_CARE_PROVIDER_SITE_OTHER): Payer: 59 | Admitting: Internal Medicine

## 2012-02-26 VITALS — BP 110/70 | HR 124 | Temp 98.5°F | Resp 16 | Wt 219.0 lb

## 2012-02-26 DIAGNOSIS — H6692 Otitis media, unspecified, left ear: Secondary | ICD-10-CM

## 2012-02-26 DIAGNOSIS — H6690 Otitis media, unspecified, unspecified ear: Secondary | ICD-10-CM

## 2012-02-26 MED ORDER — AMOXICILLIN-POT CLAVULANATE 875-125 MG PO TABS: 1.0000 | ORAL_TABLET | Freq: Two times a day (BID) | ORAL | Status: AC

## 2012-02-26 NOTE — Telephone Encounter (Signed)
Caller: Matt/Patient; Patient Name: [name]; PCP: Sanda Linger (Adults only); Best Callback Phone Number: [phone]; Reason for call: L ear pain.  Onset: 02/18/12.  Afebrile.  Seen at [facility] 02/18/12; diagnosed with L otitis media and sinus infection.  On Augmentin BID X 10 days. Concerned ear infection is not resolved; however pain has lessened.  Advised to see MD within 24 hours for constant dull earache, throbbing in ear and feeling of fullness per Ear Symptoms guideline.  Appointment scheduled for 02/26/12 at 1600 with Dr Yetta Barre.

## 2012-02-26 NOTE — Patient Instructions (Signed)
Otitis Media, Adult  A middle ear infection is an infection in the space behind the eardrum. The medical name for this is "otitis media." It may happen after a common cold. It is caused by a germ that starts growing in that space. You may feel swollen glands in your neck on the side of the ear infection.  HOME CARE INSTRUCTIONS   · Take your medicine as directed until it is gone, even if you feel better after the first few days.  · Only take over-the-counter or prescription medicines for pain, discomfort, or fever as directed by your caregiver.  · Occasional use of a nasal decongestant a couple times per day may help with discomfort and help the eustachian tube to drain better.  Follow up with your caregiver in 10 to 14 days or as directed, to be certain that the infection has cleared. Not keeping the appointment could result in a chronic or permanent injury, pain, hearing loss and disability. If there is any problem keeping the appointment, you must call back to this facility for assistance.  SEEK IMMEDIATE MEDICAL CARE IF:   · You are not getting better in 2 to 3 days.  · You have pain that is not controlled with medication.  · You feel worse instead of better.  · You cannot use the medication as directed.  · You develop swelling, redness or pain around the ear or stiffness in your neck.  MAKE SURE YOU:   · Understand these instructions.  · Will watch your condition.  · Will get help right away if you are not doing well or get worse.  Document Released: 03/03/2004 Document Revised: 05/18/2011 Document Reviewed: 01/03/2008  ExitCare® Patient Information ©2012 [employer].

## 2012-02-26 NOTE — Assessment & Plan Note (Signed)
He has improved some on augmentin so will continue that for now

## 2012-02-26 NOTE — Progress Notes (Signed)
  Subjective:    Patient ID: [name], male    DOB: 1980/12/24, 31 y.o.   MRN: [id number]  Otalgia  There is pain in the left ear. This is a recurrent problem. The current episode started 1 to 4 weeks ago. The problem occurs every few hours. The problem has been gradually improving. There has been no fever. The pain is mild. Pertinent negatives include no abdominal pain, coughing, diarrhea, drainage, ear discharge, headaches, hearing loss, neck pain, rash, rhinorrhea, sore throat or vomiting. He has tried antibiotics for the symptoms. The treatment provided mild relief. His past medical history is significant for a chronic ear infection.      Review of Systems  Constitutional: Negative for fever, chills, diaphoresis, activity change, appetite change, fatigue and unexpected weight change.  HENT: Positive for ear pain and tinnitus. Negative for hearing loss, nosebleeds, sore throat, facial swelling, rhinorrhea, trouble swallowing, neck pain, neck stiffness, voice change, sinus pressure and ear discharge.   Eyes: Negative.   Respiratory: Negative for cough.   Cardiovascular: Negative.   Gastrointestinal: Negative for vomiting, abdominal pain, diarrhea and constipation.  Genitourinary: Negative.   Musculoskeletal: Negative.   Skin: Negative.  Negative for color change, pallor, rash and wound.  Neurological: Negative.  Negative for headaches.  Hematological: Negative.   Psychiatric/Behavioral: Negative.        Objective:   Physical Exam  Vitals reviewed. Constitutional: He is oriented to person, place, and time. He appears well-developed and well-nourished.  Non-toxic appearance. He does not have a sickly appearance. He does not appear ill. No distress.  HENT:  Head: Normocephalic and atraumatic.  Right Ear: Hearing, tympanic membrane, external ear and ear canal normal.  Left Ear: Hearing, external ear and ear canal normal. No drainage or swelling. No mastoid tenderness. Tympanic  membrane is injected, erythematous and retracted. Tympanic membrane is not scarred, not perforated and not bulging. A middle ear effusion is present. No hemotympanum. No decreased hearing is noted.  Mouth/Throat: Oropharynx is clear and moist. No oropharyngeal exudate.  Eyes: Conjunctivae normal are normal. Right eye exhibits no discharge. Left eye exhibits no discharge. No scleral icterus.  Neck: Normal range of motion. Neck supple. No JVD present. No tracheal deviation present. No thyromegaly present.  Cardiovascular: Normal rate, regular rhythm, normal heart sounds and intact distal pulses.  Exam reveals no gallop and no friction rub.   No murmur heard. Pulmonary/Chest: Effort normal and breath sounds normal. No stridor. No respiratory distress. He has no wheezes. He has no rales. He exhibits no tenderness.  Abdominal: Soft. Bowel sounds are normal. He exhibits no distension and no mass. There is no tenderness. There is no rebound and no guarding.  Musculoskeletal: Normal range of motion. He exhibits no edema and no tenderness.  Lymphadenopathy:    He has no cervical adenopathy.  Neurological: He is oriented to person, place, and time.  Skin: Skin is warm and dry. No rash noted. He is not diaphoretic. No erythema. No pallor.  Psychiatric: He has a normal mood and affect. His behavior is normal. Judgment and thought content normal.          Assessment & Plan:

## 2012-04-25 ENCOUNTER — Emergency Department (INDEPENDENT_AMBULATORY_CARE_PROVIDER_SITE_OTHER)
Admission: EM | Admit: 2012-04-25 | Discharge: 2012-04-25 | Disposition: A | Discharge status: Home / Self Care | Payer: 59 | Source: Home / Self Care

## 2012-04-25 ENCOUNTER — Encounter (HOSPITAL_COMMUNITY): Payer: Self-pay | Admitting: Emergency Medicine

## 2012-04-25 DIAGNOSIS — R509 Fever, unspecified: Secondary | ICD-10-CM

## 2012-04-25 DIAGNOSIS — B349 Viral infection, unspecified: Secondary | ICD-10-CM

## 2012-04-25 DIAGNOSIS — B9789 Other viral agents as the cause of diseases classified elsewhere: Secondary | ICD-10-CM

## 2012-04-25 NOTE — ED Notes (Signed)
Pt states that he woke from sleeping with a fever of 103 and upon standing had a violent episode of vomiting. Pt states that he has been having a lot of confusion and cant seem to remember things. Pt has a hx of hermansky-pudlak syndrome recently started taking two new meds last week gabopenten and bacofin ?s if meds are the cause.   Pt states that he is having pain that is localized to left hip.

## 2012-04-25 NOTE — ED Provider Notes (Signed)
History     CSN: [id number]  Arrival date & time 04/25/12  1859   None     Chief Complaint  Patient presents with  . Fever    woke with fever at 2 p.m and having a violent vomiting episode upon standing. pt states having feelings of confusion and not remembering things.    (Consider location/radiation/quality/duration/timing/severity/associated sxs/prior treatment) HPI Comments: 31 year old who awoke at 2 PM today, usually awakens much earlier than that, and noticed that he felt generally poor. He felt like he had a fever and a thermometer down he had a temporal 103. He has some nausea and vomiting he wants. His concerns and he was confused at the time forgetful. He says he still feels a little confused and that is what bothered him as well as the source of the fever. He took Tylenol at home and he defervesced. Upon arrival his temperature was normal. 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 He was also started on fentanyl and another medication this week in addition to his oxycodone. He admits that this may have added to his confusion associated with his fever. Now he is fully alert and oriented. Speech is quite clear and he speaks in intelligibly and intelligently.   Past Medical History  Diagnosis Date  . Hermansky-Pudlak syndrome     colitis, albinism,fibrosis, followed by [facility] renal  . Pneumonia, organism unspecified   . Pain in limb   . Other specified cardiac dysrhythmias   . Allergic rhinitis, cause unspecified   . Allergy, unspecified not elsewhere classified   . Eustachian tube dysfunction   . Generalized anxiety disorder   . Other acne   . Intrinsic asthma, unspecified   . Disorder of bone and cartilage, unspecified   . Other disturbances of aromatic amino-acid metabolism   . Unspecified visual loss   . Universal ulcerative (chronic) colitis   . Iron deficiency anemia, unspecified   . Other and  unspecified noninfectious gastroenteritis and colitis     Past Surgical History  Procedure Date  . Nih w/u pulm w/u mild asthma ned fibrosis 01-2000  . [facility] admit for observation 09-01-2003  . Head ct w/o nml mod l mastoid effusion 06-09-08  . Sllep study mild periodic limb movements 08-25-2004  . Colonoscopy complete unch coloitis start remicade 11-1999  . Colonoscopy colitis 09-1999  . Pft's (dr young) no fibrosis 12-09-2001    Family History  Problem Relation Age of Onset  . Asthma      grandmother-second hand smoke  . COPD      grandmother  . Asthma Father   . Other Cousin     HPS-1    History  Substance Use Topics  . Smoking status: Never Smoker   . Smokeless tobacco: Never Used  . Alcohol Use: Yes     Comment: very little      Review of Systems  Constitutional: Positive for fever and activity change. Negative for diaphoresis and fatigue.  HENT: Negative for ear pain, facial swelling, rhinorrhea, neck pain and neck stiffness.   Eyes: Negative for pain, discharge and redness.  Respiratory: Negative for chest tightness and shortness of breath.   Cardiovascular: Negative.   Gastrointestinal: Negative.   Genitourinary: Negative.   Musculoskeletal: Negative.   Neurological: Negative.   Psychiatric/Behavioral: Positive for confusion.    Allergies  Metronidazole  Home Medications   Current Outpatient Rx  Name  Route  Sig  Dispense  Refill  . ALBUTEROL SULFATE HFA 108 (90 BASE) MCG/ACT IN AERS   Inhalation   Inhale 2 puffs into the lungs 4 (four) times daily as needed for wheezing or shortness of breath.   1 Inhaler   prn   . BUDESONIDE-FORMOTEROL FUMARATE 80-4.5 MCG/ACT IN AERO   Inhalation   Inhale 2 puffs into the lungs 2 (two) times daily. Rinse mouth   1 Inhaler   prn   . CERTOLIZUMAB PEGOL 2 X 200 MG Perry KIT   Subcutaneous   Inject into the skin. Injection every 2 weeks         . ESOMEPRAZOLE MAGNESIUM 40 MG PO CPDR   Oral   Take 40 mg by  mouth daily before breakfast.           . FLUTICASONE PROPIONATE 50 MCG/ACT NA SUSP      Use as directed         . HYDROCODONE-ACETAMINOPHEN 5-500 MG PO TABS   Oral   Take 1 tablet by mouth every 4 (four) hours as needed.           . OXYCODONE HCL 5 MG PO CAPS   Oral   Take 10 mg by mouth daily as needed.         [name] FLUTTER DEVI      5 blows, 3 times daily          . FLUTTER DEVI      Blow through 4 times per set,    3 sets per day when needed   1 each   0     BP 101/69  Pulse 121  Temp 98.6 F (37 C) (Oral)  Resp 20  SpO2 98%  Physical Exam  Constitutional: He is oriented to person, place, and time. He appears well-developed and well-nourished. No distress.  HENT:  Head: Normocephalic and atraumatic.  Right Ear: External ear normal.  Left Ear: External ear normal.  Mouth/Throat: Oropharynx is clear and moist.  Eyes: Conjunctivae normal are normal.  Neck: Normal range of motion. Neck supple.  Cardiovascular: Normal rate, regular rhythm and normal heart sounds.   No murmur heard. Pulmonary/Chest: Effort normal and breath sounds normal. No respiratory distress. He has no wheezes. He has no rales.  Abdominal: Soft. He exhibits no distension.  Musculoskeletal: Normal range of motion. He exhibits no edema and no tenderness.  Lymphadenopathy:    He has no cervical adenopathy.  Neurological: He is alert and oriented to person, place, and time. No cranial nerve deficit.  Skin: Skin is warm and dry. No rash noted.  Psychiatric: He has a normal mood and affect.    ED Course  Procedures (including critical care time)  Labs Reviewed - No data to display No results found.   1. Viral syndrome   2. Fever       MDM   Suspect the with a normal examination he experienced a fever due to viral syndrome. He responded well to one dose of acetaminophen and once the fever abated he has not had a recent his temperature. Also discussed the possibility of  interference of his medication such as fentanyl contributing to his time of confusion in association with his fever. He is now stable, alert, oriented with normal thought processes and memory. He is to follow up with his doctor should any symptoms persist next week if there is any worsening or poor response to Tylenol or new symptoms he should return either here or if worse to  the emergency department.          Hayden Rasmussen, NP 04/25/12 2121

## 2012-04-25 NOTE — ED Provider Notes (Signed)
Medical screening examination/treatment/procedure(s) were performed by resident physician or non-physician practitioner and as supervising physician I was immediately available for consultation/collaboration.   KINDL,JAMES DOUGLAS MD.    James D Kindl, MD 04/25/12 2124 

## 2012-05-20 ENCOUNTER — Ambulatory Visit: Payer: 59 | Admitting: Internal Medicine

## 2012-06-17 ENCOUNTER — Encounter: Payer: Self-pay | Admitting: Internal Medicine

## 2012-06-17 ENCOUNTER — Other Ambulatory Visit: Payer: Medicare Other

## 2012-06-17 ENCOUNTER — Ambulatory Visit (INDEPENDENT_AMBULATORY_CARE_PROVIDER_SITE_OTHER): Payer: 59 | Admitting: Internal Medicine

## 2012-06-17 VITALS — BP 118/80 | HR 145 | Temp 99.3°F | Resp 20 | Wt 232.0 lb

## 2012-06-17 DIAGNOSIS — L02219 Cutaneous abscess of trunk, unspecified: Secondary | ICD-10-CM

## 2012-06-17 DIAGNOSIS — L02214 Cutaneous abscess of groin: Secondary | ICD-10-CM

## 2012-06-17 DIAGNOSIS — Z23 Encounter for immunization: Secondary | ICD-10-CM

## 2012-06-17 DIAGNOSIS — K5100 Ulcerative (chronic) pancolitis without complications: Secondary | ICD-10-CM

## 2012-06-17 MED ORDER — SULFAMETHOXAZOLE-TRIMETHOPRIM 800-160 MG PO TABS: 1.0000 | ORAL_TABLET | Freq: Two times a day (BID) | ORAL | Status: DC

## 2012-06-17 NOTE — Patient Instructions (Signed)
Abscess An abscess is an infected area that contains a collection of pus and debris. It can occur in almost any part of the body. An abscess is also known as a furuncle or boil. CAUSES   An abscess occurs when tissue gets infected. This can occur from blockage of oil or sweat glands, infection of hair follicles, or a minor injury to the skin. As the body tries to fight the infection, pus collects in the area and creates pressure under the skin. This pressure causes pain. People with weakened immune systems have difficulty fighting infections and get certain abscesses more often.   SYMPTOMS Usually an abscess develops on the skin and becomes a painful mass that is red, warm, and tender. If the abscess forms under the skin, you may feel a moveable soft area under the skin. Some abscesses break open (rupture) on their own, but most will continue to get worse without care. The infection can spread deeper into the body and eventually into the bloodstream, causing you to feel ill.   DIAGNOSIS   Your caregiver will take your medical history and perform a physical exam. A sample of fluid may also be taken from the abscess to determine what is causing your infection. TREATMENT   Your caregiver may prescribe antibiotic medicines to fight the infection. However, taking antibiotics alone usually does not cure an abscess. Your caregiver may need to make a small cut (incision) in the abscess to drain the pus. In some cases, gauze is packed into the abscess to reduce pain and to continue draining the area. HOME CARE INSTRUCTIONS    Only take over-the-counter or prescription medicines for pain, discomfort, or fever as directed by your caregiver.   If you were prescribed antibiotics, take them as directed. Finish them even if you start to feel better.   If gauze is used, follow your caregiver's directions for changing the gauze.   To avoid spreading the infection:   Keep your draining abscess covered with a  bandage.   Wash your hands well.   Do not share personal care items, towels, or whirlpools with others.   Avoid skin contact with others.   Keep your skin and clothes clean around the abscess.   Keep all follow-up appointments as directed by your caregiver.  SEEK MEDICAL CARE IF:    You have increased pain, swelling, redness, fluid drainage, or bleeding.   You have muscle aches, chills, or a general ill feeling.   You have a fever.  MAKE SURE YOU:    Understand these instructions.   Will watch your condition.   Will get help right away if you are not doing well or get worse.  Document Released: 03/08/2005 Document Revised: 11/28/2011 Document Reviewed: 08/11/2011 ExitCare Patient Information 2013 [employer].    

## 2012-06-19 ENCOUNTER — Encounter: Payer: Self-pay | Admitting: Internal Medicine

## 2012-06-19 ENCOUNTER — Telehealth: Payer: Self-pay | Admitting: *Deleted

## 2012-06-19 NOTE — Telephone Encounter (Signed)
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

## 2012-06-19 NOTE — Assessment & Plan Note (Signed)
Will check the culture results Will empirically treat with SMX-TMP

## 2012-06-19 NOTE — Progress Notes (Signed)
Subjective:    Patient ID: [name], male    DOB: [birth year], 32 y.o.   MRN: [id number]  Wound Check He was originally treated 3 to 5 days ago. Prior ED Treatment: he popped it and pus drained out. His temperature was unmeasured prior to arrival. There has been colored discharge from the wound. The redness has not changed. The swelling has not changed. The pain has not changed. He has no difficulty moving the affected extremity or digit.      Review of Systems  Constitutional: Negative for fever, chills, diaphoresis, activity change, appetite change, fatigue and unexpected weight change.  HENT: Negative.   Eyes: Negative.   Respiratory: Negative for cough, chest tightness, wheezing and stridor.   Gastrointestinal: Positive for anal bleeding and rectal pain. Negative for nausea, vomiting, abdominal pain, diarrhea, constipation, blood in stool and abdominal distention.  Genitourinary: Negative.   Skin: Positive for wound. Negative for color change, pallor and rash.  Neurological: Negative.   Hematological: Negative for adenopathy. Does not bruise/bleed easily.  Psychiatric/Behavioral: Negative.        Objective:   Physical Exam  Constitutional: He is oriented to person, place, and time. He appears well-developed and well-nourished.  Non-toxic appearance. He does not have a sickly appearance. He does not appear ill. No distress.  HENT:  Head: Normocephalic and atraumatic.  Mouth/Throat: Oropharynx is clear and moist. No oropharyngeal exudate.  Eyes: Conjunctivae normal are normal. Right eye exhibits no discharge. Left eye exhibits no discharge. No scleral icterus.  Neck: Normal range of motion. Neck supple. No JVD present. No tracheal deviation present. No thyromegaly present.  Cardiovascular: Normal rate, regular rhythm, normal heart sounds and intact distal pulses.  Exam reveals no gallop and no friction rub.   No murmur heard. Pulmonary/Chest: Effort normal and breath  sounds normal. No stridor. No respiratory distress. He has no wheezes. He has no rales. He exhibits no tenderness.  Abdominal: Soft. Normal appearance and bowel sounds are normal. He exhibits no shifting dullness, no distension, no pulsatile liver, no fluid wave, no abdominal bruit, no pulsatile midline mass and no mass. There is no hepatosplenomegaly, splenomegaly or hepatomegaly. There is no tenderness. There is no rebound, no guarding and no CVA tenderness. Hernia confirmed negative in the right inguinal area and confirmed negative in the left inguinal area.    Genitourinary: Prostate normal, testes normal and penis normal. Rectal exam shows fissure and tenderness. Rectal exam shows no external hemorrhoid, no internal hemorrhoid, no mass and anal tone normal. Guaiac negative stool. Prostate is not enlarged and not tender. Right testis shows no mass, no swelling and no tenderness. Right testis is descended. Left testis shows no mass, no swelling and no tenderness. Left testis is descended. Circumcised. No penile erythema or penile tenderness. No discharge found.          He has erosions and ulcers around his anus but no induration/fluctuance/streaking/exudate  Musculoskeletal: Normal range of motion. He exhibits no edema and no tenderness.  Lymphadenopathy:    He has no cervical adenopathy.       Right: No inguinal adenopathy present.       Left: No inguinal adenopathy present.  Neurological: He is oriented to person, place, and time.  Skin: Abrasion and lesion noted. No bruising, no burn, no ecchymosis, no laceration, no petechiae and no rash noted. He is not diaphoretic. There is erythema.     Psychiatric: He has a normal mood and affect. His behavior is normal. Judgment  and thought content normal.      Lab Results  Component Value Date   WBC 5.0 03/08/2011   HGB 13.2 03/08/2011   HCT 38.8 03/08/2011   PLT 165 03/08/2011   GLUCOSE 106* 03/08/2011   ALT 16 03/08/2011   AST 19 03/08/2011    NA 138 03/08/2011   K 4.0 03/08/2011   CL 106 03/08/2011   CREATININE 1.94* 03/08/2011   BUN 20 03/08/2011   CO2 18* 03/08/2011   TSH 0.964 06/21/2007   INR 0.96 08/23/2009      Assessment & Plan:

## 2012-06-19 NOTE — Assessment & Plan Note (Signed)
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

## 2012-06-19 NOTE — Telephone Encounter (Signed)
Pt is requesting results of wound culture.

## 2012-06-20 LAB — WOUND CULTURE
Gram Stain: NONE SEEN
Organism ID, Bacteria: NO GROWTH

## 2012-06-20 NOTE — Telephone Encounter (Signed)
Pt informed of results via VM and to callback office with any questions/concerns.  

## 2012-07-15 ENCOUNTER — Telehealth: Payer: Self-pay | Admitting: Internal Medicine

## 2012-07-15 MED ORDER — AMOXICILLIN-POT CLAVULANATE 875-125 MG PO TABS: 1.0000 | ORAL_TABLET | Freq: Two times a day (BID) | ORAL | Status: DC

## 2012-07-15 NOTE — Telephone Encounter (Signed)
Called, spoke with pt's father.  Informed him of below recs per Dr. Maple Hudson. He verbalized understanding of this and is in agreement with this plan. He is aware augmentin rx sent [facility] in [facility] and to have pt keep OV for tomorrow.  He verbalized understanding and voiced no further questions or concerns at this time.

## 2012-07-15 NOTE — Telephone Encounter (Signed)
Called, spoke with pt's father.  Reports pt had n/v/d last week along with f/c/s.  N/v/d is starting to pass.  Reports "heavy pulmonary congestion" that worsened over the weekend and prod cough.  Unsure of color of mucus.  Pt has a pending OV with CDY tomorrow but concerned this may turn into pna.  Pt feels he needs to be seen today if at all possible.  Dr. Maple Hudson, pls advise if pt can be worked in this evening or if you have any further recs.  Pls advise.  Thank you.  [facility]  Allergies  Allergen Reactions  . Metronidazole     REACTION: neuropathy

## 2012-07-15 NOTE — Telephone Encounter (Signed)
Per CY-suggest we start Augmentin 875 mg #14 take 1 po bid no refills now and see him tomorrow so we can see how this illness is shaping up.

## 2012-07-16 ENCOUNTER — Ambulatory Visit (INDEPENDENT_AMBULATORY_CARE_PROVIDER_SITE_OTHER): Payer: Medicare Other | Admitting: Internal Medicine

## 2012-07-16 ENCOUNTER — Other Ambulatory Visit (INDEPENDENT_AMBULATORY_CARE_PROVIDER_SITE_OTHER): Payer: Medicare Other

## 2012-07-16 ENCOUNTER — Ambulatory Visit (INDEPENDENT_AMBULATORY_CARE_PROVIDER_SITE_OTHER)
Admission: RE | Admit: 2012-07-16 | Discharge: 2012-07-16 | Disposition: A | Discharge status: Home / Self Care | Payer: Medicare Other | Source: Ambulatory Visit | Attending: Internal Medicine | Admitting: Internal Medicine

## 2012-07-16 ENCOUNTER — Encounter: Payer: Self-pay | Admitting: Internal Medicine

## 2012-07-16 VITALS — BP 122/78 | HR 95 | Ht 72.0 in | Wt 222.8 lb

## 2012-07-16 DIAGNOSIS — J209 Acute bronchitis, unspecified: Secondary | ICD-10-CM

## 2012-07-16 LAB — CBC WITH DIFFERENTIAL/PLATELET
Basophils Absolute: 0 10*3/uL (ref 0.0–0.1)
Basophils Relative: 0.3 % (ref 0.0–3.0)
Eosinophils Absolute: 0.1 10*3/uL (ref 0.0–0.7)
Lymphocytes Relative: 27.7 % (ref 12.0–46.0)
MCHC: 33.3 g/dL (ref 30.0–36.0)
MCV: 79.4 fl (ref 78.0–100.0)
Monocytes Absolute: 0.6 10*3/uL (ref 0.1–1.0)
Neutrophils Relative %: 59.8 % (ref 43.0–77.0)
Platelets: 237 10*3/uL (ref 150.0–400.0)
RBC: 5.45 Mil/uL (ref 4.22–5.81)
RDW: 15.8 % — ABNORMAL HIGH (ref 11.5–14.6)

## 2012-07-16 LAB — BASIC METABOLIC PANEL
Chloride: 105 mEq/L (ref 96–112)
GFR: 33.11 mL/min — ABNORMAL LOW (ref >60.00)
Potassium: 3.9 mEq/L (ref 3.5–5.1)
Sodium: 136 mEq/L (ref 135–145)

## 2012-07-16 NOTE — Patient Instructions (Addendum)
Order- CXR  Dx acute bronchitis             Lab CBC, BMET    Ask about probiotic- fine from my standpoint- like Florastor  We may be able to stop the augmentin early

## 2012-07-16 NOTE — Progress Notes (Signed)
Subjective:    Patient ID: [name], male    DOB: Dec 02, 1980, 32 y.o.   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   Here with friend [name] Last here August 09, 2010 Currently says breathing is pretty good. Had temp 100 degrees x 1 week, sore throat, tender left cervical node. Had dieted and lost 40 lbs since off prednisone x 6 months. Now on 6-mercaptopurine (which drops WBC to 3000) and Remicade. These drugs are managed by Dr Sherrill/ Heme Onc and Dr Schooler/ GI. Denies wheeze, phlegm. Took oxycodone this AM for pains. For the past year has had some dry cough. Concerned about this as an early fibrosis marker. Wants PFT every 6 months. He plans to go( unscheduled so far) for CXR and BAL at [facility] so he asks not to do a cxr here so as to minimize radiation exposure.    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   Since last here he had a total colectomy in 2012. Had ER visit at [facility] 03/24/2011 and CT scan suggested "something in lung" which he wanted to review. Treated with Levaquin. Has also had ER visit for kidney stone. Has stage III renal disease with 20% function-not known why. CXR 11/26/11- he reports "much clearer". Images were available on discs he brought from [facility]. We reviewed the images together. It looks as if he was dealing with a pneumonia in October with some residual density-pneumonia versus atelectasis-on the recent chest x-ray. He has minimal cough and no acute chest symptoms. Some of his interstitial markings may have been a little prominent but there was no dramatic interstitial fibrosis.  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 ACUTE VISIT: about 1 week or so ago started having cough, congestion, and had stomach flu over the weekend; noticed increase in mucus-started abx yesterday and has reduced the mucus amount. Major concern was breathing issues-felt unable to get breath and go any further. Noiced wheezing as well.We had called in augmentin yesterday to start because of his concern he was getting pneumonia. Describes 2 weeks of increased chest congestion and chest tightness but very productive cough. GI viral gastroenteritis pattern over this weekend seems to be a different problem. We had called in Augmentin which further aggravated loose stools through his colostomy. He is not on a routine probiotic. Today feeling better. Denies fever or sore throat.  ROS-see HPI Constitutional:   No-   weight loss, night sweats, fevers, chills, +fatigue, lassitude. HEENT:   No-  headaches, difficulty swallowing, tooth/dental problems, sore throat,       No-  sneezing, itching, ear ache, nasal congestion, post nasal drip,  CV:  No-   chest pain, orthopnea, PND, swelling in lower extremities, anasarca, dizziness, palpitations Resp: + shortness of breath with exertion or at rest.              No-   productive cough,  + non-productive cough,  No- coughing up of blood.              No-   change in color of mucus.  No- wheezing.   Skin: No-   rash or lesions. GI:  No-   heartburn, indigestion, abdominal  pain, + resolving nausea, vomiting,  GU:  MS:  No-   joint pain or swelling.   Neuro-     nothing unusual Psych:  No- change in mood or affect. No depression or anxiety.  No memory loss.  OBJ- Physical Exam General- Alert, Oriented, Affect-appropriate, Distress- none acute, albino, trim Skin- rash-none, lesions- none, excoriation- none Lymphadenopathy- none Head- atraumatic             Eyes-  PERRLA, conjunctivae and secretions clear. +Strabismus, +nystagmus            Ears- Hearing, canals-normal            Nose- Clear, no-Septal dev, mucus, polyps, erosion, perforation             Throat- Mallampati II , mucosa clear , drainage- none, tonsils- atrophic Neck- flexible , trachea midline, no stridor , thyroid nl, carotid no bruit Chest - symmetrical excursion , unlabored           Heart/CV- RRR , no murmur , no gallop  , no rub, nl s1 s2                           - JVD- none , edema- none, stasis changes- none, varices- none           Lung- clear to P&A, wheeze- none, cough+light , dullness-none, rub- none           Chest wall-  Abd- +colostomy Gen/ Rectal- Not done, not indicated Extrem- cyanosis- none, clubbing, none, atrophy- none, strength- nl Neuro- grossly intact to observation except nystagmus

## 2012-07-19 NOTE — Progress Notes (Signed)
Quick Note:  Pt aware of results. ______ 

## 2012-07-25 DIAGNOSIS — J209 Acute bronchitis, unspecified: Secondary | ICD-10-CM | POA: Insufficient documentation

## 2012-07-25 NOTE — Assessment & Plan Note (Addendum)
This acute episode is fading. Augmentin may have helped, but caused increased loose stools. He may have also had a gastroenteritis as a separate illness. The tendency with his Hermansky-Pudlak syndrome is for pulmonary fibrosis over time. Plan-chest x-ray, CBC, chemistry

## 2012-07-26 ENCOUNTER — Telehealth: Payer: Self-pay | Admitting: Pulmonary Disease

## 2012-07-26 NOTE — Telephone Encounter (Signed)
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 Recently 07/16/2012 started on augmentin for increased chest congestion/sputum production and dyspnea.   He calls today for worsening symptoms, increased dyspnea, wheezing, cough productive of yellow dark sputum without fever or chills. Denies chest pain; hemoptysis.   Plan: d/c augmentin; start Ciprofloxacin and Doxycycline for 10 days (unfortunately no recent sputum microbiology obtained/available for my review) (would cover pretty broadly even community acquired pseudomonas and MRSA); increase prednisone to 40 mg for 2 days, then 20 mg for 2 days then decrease to maintenance.   I also called in for him a prescription for Tussionex.   I advised him to present to the ED with worsening symptoms or call the office on Monday if not improving for further plan.

## 2012-07-30 ENCOUNTER — Other Ambulatory Visit: Payer: Self-pay

## 2012-07-30 ENCOUNTER — Ambulatory Visit (INDEPENDENT_AMBULATORY_CARE_PROVIDER_SITE_OTHER): Payer: Medicare Other | Admitting: Internal Medicine

## 2012-07-30 ENCOUNTER — Ambulatory Visit (INDEPENDENT_AMBULATORY_CARE_PROVIDER_SITE_OTHER)
Admission: RE | Admit: 2012-07-30 | Discharge: 2012-07-30 | Disposition: A | Discharge status: Home / Self Care | Payer: Medicare Other | Source: Ambulatory Visit | Attending: Internal Medicine | Admitting: Internal Medicine

## 2012-07-30 ENCOUNTER — Emergency Department (HOSPITAL_COMMUNITY): Payer: 59

## 2012-07-30 ENCOUNTER — Telehealth: Payer: Self-pay | Admitting: Internal Medicine

## 2012-07-30 ENCOUNTER — Inpatient Hospital Stay (HOSPITAL_COMMUNITY)
Admission: EM | Admit: 2012-07-30 | Discharge: 2012-08-02 | DRG: 299 | Disposition: A | Discharge status: Home Health | Payer: 59 | Attending: Internal Medicine | Admitting: Internal Medicine

## 2012-07-30 ENCOUNTER — Encounter (HOSPITAL_COMMUNITY): Payer: Self-pay | Admitting: *Deleted

## 2012-07-30 ENCOUNTER — Encounter: Payer: Self-pay | Admitting: Internal Medicine

## 2012-07-30 ENCOUNTER — Other Ambulatory Visit (INDEPENDENT_AMBULATORY_CARE_PROVIDER_SITE_OTHER): Payer: Medicare Other

## 2012-07-30 VITALS — BP 112/76 | HR 119 | Ht 72.0 in | Wt 221.0 lb

## 2012-07-30 DIAGNOSIS — R0989 Other specified symptoms and signs involving the circulatory and respiratory systems: Secondary | ICD-10-CM

## 2012-07-30 DIAGNOSIS — G43909 Migraine, unspecified, not intractable, without status migrainosus: Secondary | ICD-10-CM

## 2012-07-30 DIAGNOSIS — R0609 Other forms of dyspnea: Secondary | ICD-10-CM

## 2012-07-30 DIAGNOSIS — Z9049 Acquired absence of other specified parts of digestive tract: Secondary | ICD-10-CM

## 2012-07-30 DIAGNOSIS — B369 Superficial mycosis, unspecified: Secondary | ICD-10-CM | POA: Diagnosis present

## 2012-07-30 DIAGNOSIS — J189 Pneumonia, unspecified organism: Secondary | ICD-10-CM

## 2012-07-30 DIAGNOSIS — R000 Tachycardia, unspecified: Secondary | ICD-10-CM

## 2012-07-30 DIAGNOSIS — R0600 Dyspnea, unspecified: Secondary | ICD-10-CM

## 2012-07-30 DIAGNOSIS — J9601 Acute respiratory failure with hypoxia: Secondary | ICD-10-CM

## 2012-07-30 DIAGNOSIS — I82402 Acute embolism and thrombosis of unspecified deep veins of left lower extremity: Secondary | ICD-10-CM

## 2012-07-30 DIAGNOSIS — F329 Major depressive disorder, single episode, unspecified: Secondary | ICD-10-CM | POA: Diagnosis present

## 2012-07-30 DIAGNOSIS — D509 Iron deficiency anemia, unspecified: Secondary | ICD-10-CM | POA: Diagnosis present

## 2012-07-30 DIAGNOSIS — F339 Major depressive disorder, recurrent, unspecified: Secondary | ICD-10-CM

## 2012-07-30 DIAGNOSIS — J309 Allergic rhinitis, unspecified: Secondary | ICD-10-CM

## 2012-07-30 DIAGNOSIS — L02214 Cutaneous abscess of groin: Secondary | ICD-10-CM

## 2012-07-30 DIAGNOSIS — L272 Dermatitis due to ingested food: Secondary | ICD-10-CM

## 2012-07-30 DIAGNOSIS — J9600 Acute respiratory failure, unspecified whether with hypoxia or hypercapnia: Secondary | ICD-10-CM

## 2012-07-30 DIAGNOSIS — F3289 Other specified depressive episodes: Secondary | ICD-10-CM | POA: Diagnosis present

## 2012-07-30 DIAGNOSIS — E70331 Hermansky-Pudlak syndrome: Secondary | ICD-10-CM

## 2012-07-30 DIAGNOSIS — N189 Chronic kidney disease, unspecified: Secondary | ICD-10-CM

## 2012-07-30 DIAGNOSIS — K5100 Ulcerative (chronic) pancolitis without complications: Secondary | ICD-10-CM

## 2012-07-30 DIAGNOSIS — J45909 Unspecified asthma, uncomplicated: Secondary | ICD-10-CM

## 2012-07-30 DIAGNOSIS — S31104A Unspecified open wound of abdominal wall, left lower quadrant without penetration into peritoneal cavity, initial encounter: Secondary | ICD-10-CM

## 2012-07-30 DIAGNOSIS — I82409 Acute embolism and thrombosis of unspecified deep veins of unspecified lower extremity: Principal | ICD-10-CM

## 2012-07-30 DIAGNOSIS — F411 Generalized anxiety disorder: Secondary | ICD-10-CM | POA: Diagnosis present

## 2012-07-30 DIAGNOSIS — R0602 Shortness of breath: Secondary | ICD-10-CM

## 2012-07-30 DIAGNOSIS — D691 Qualitative platelet defects: Secondary | ICD-10-CM | POA: Diagnosis present

## 2012-07-30 DIAGNOSIS — J209 Acute bronchitis, unspecified: Secondary | ICD-10-CM

## 2012-07-30 DIAGNOSIS — E7089 Other disorders of aromatic amino-acid metabolism: Secondary | ICD-10-CM

## 2012-07-30 DIAGNOSIS — Z79899 Other long term (current) drug therapy: Secondary | ICD-10-CM

## 2012-07-30 DIAGNOSIS — D72829 Elevated white blood cell count, unspecified: Secondary | ICD-10-CM | POA: Diagnosis present

## 2012-07-30 LAB — APTT: aPTT: 31 seconds (ref 24–37)

## 2012-07-30 LAB — BASIC METABOLIC PANEL
BUN: 31 mg/dL — ABNORMAL HIGH (ref 6–23)
Chloride: 101 mEq/L (ref 96–112)
GFR calc Af Amer: 55 mL/min — ABNORMAL LOW (ref >90)
Glucose, Bld: 80 mg/dL (ref 70–99)
Potassium: 3.6 mEq/L (ref 3.5–5.1)

## 2012-07-30 LAB — CBC WITH DIFFERENTIAL/PLATELET
Hemoglobin: 14.4 g/dL (ref 13.0–17.0)
Lymphocytes Relative: 14 % (ref 12–46)
Lymphs Abs: 1.7 10*3/uL (ref 0.7–4.0)
Monocytes Relative: 7 % (ref 3–12)
Neutro Abs: 9.4 10*3/uL — ABNORMAL HIGH (ref 1.7–7.7)
Neutrophils Relative %: 78 % — ABNORMAL HIGH (ref 43–77)
RBC: 5.45 MIL/uL (ref 4.22–5.81)

## 2012-07-30 MED ORDER — SODIUM CHLORIDE 0.9 % IV SOLN: INTRAVENOUS | Status: DC

## 2012-07-30 MED ORDER — SODIUM CHLORIDE 0.9 % IJ SOLN: 3.0000 mL | INTRAMUSCULAR | Status: DC | PRN

## 2012-07-30 MED ORDER — ALBUTEROL SULFATE HFA 108 (90 BASE) MCG/ACT IN AERS
2.0000 | INHALATION_SPRAY | Freq: Four times a day (QID) | RESPIRATORY_TRACT | Status: DC | PRN
  Filled 2012-07-30: qty 6.7

## 2012-07-30 MED ORDER — TECHNETIUM TO 99M ALBUMIN AGGREGATED
6.0000 | Freq: Once | INTRAVENOUS | Status: AC | PRN
  Administered 2012-07-30: 6 via INTRAVENOUS

## 2012-07-30 MED ORDER — SODIUM CHLORIDE 0.9 % IJ SOLN: 3.0000 mL | Freq: Two times a day (BID) | INTRAMUSCULAR | Status: DC

## 2012-07-30 MED ORDER — BUDESONIDE-FORMOTEROL FUMARATE 80-4.5 MCG/ACT IN AERO
2.0000 | INHALATION_SPRAY | Freq: Two times a day (BID) | RESPIRATORY_TRACT | Status: DC
  Administered 2012-07-31 – 2012-08-02 (×6): 2 via RESPIRATORY_TRACT
  Filled 2012-07-30: qty 6.9

## 2012-07-30 MED ORDER — SODIUM CHLORIDE 0.9 % IV SOLN: 250.0000 mL | INTRAVENOUS | Status: DC | PRN

## 2012-07-30 MED ORDER — SODIUM CHLORIDE 0.9 % IJ SOLN
3.0000 mL | Freq: Two times a day (BID) | INTRAMUSCULAR | Status: DC
  Administered 2012-07-30 – 2012-07-31 (×2): 3 mL via INTRAVENOUS
  Administered 2012-08-01: 22:00:00 via INTRAVENOUS

## 2012-07-30 MED ORDER — TERBINAFINE HCL 250 MG PO TABS
250.0000 mg | ORAL_TABLET | Freq: Every day | ORAL | Status: DC
  Administered 2012-07-31 – 2012-08-02 (×3): 250 mg via ORAL
  Filled 2012-07-30 (×4): qty 1

## 2012-07-30 MED ORDER — ALBUTEROL SULFATE (5 MG/ML) 0.5% IN NEBU: 2.5000 mg | INHALATION_SOLUTION | RESPIRATORY_TRACT | Status: DC | PRN

## 2012-07-30 MED ORDER — METHYLPREDNISOLONE SODIUM SUCC 125 MG IJ SOLR
80.0000 mg | Freq: Two times a day (BID) | INTRAMUSCULAR | Status: DC
  Administered 2012-07-30 – 2012-07-31 (×2): 80 mg via INTRAVENOUS
  Filled 2012-07-30 (×4): qty 1.28

## 2012-07-30 MED ORDER — PANTOPRAZOLE SODIUM 40 MG PO TBEC
40.0000 mg | DELAYED_RELEASE_TABLET | Freq: Every day | ORAL | Status: DC
  Administered 2012-08-01 – 2012-08-02 (×2): 40 mg via ORAL
  Filled 2012-07-30 (×3): qty 1

## 2012-07-30 MED ORDER — TECHNETIUM TC 99M DIETHYLENETRIAME-PENTAACETIC ACID: 41.0000 | Freq: Once | INTRAVENOUS | Status: DC | PRN

## 2012-07-30 MED ORDER — VANCOMYCIN HCL 10 G IV SOLR
1250.0000 mg | Freq: Two times a day (BID) | INTRAVENOUS | Status: DC
  Administered 2012-07-31 – 2012-08-02 (×5): 1250 mg via INTRAVENOUS
  Filled 2012-07-30 (×6): qty 1250

## 2012-07-30 MED ORDER — GABAPENTIN 300 MG PO CAPS
900.0000 mg | ORAL_CAPSULE | Freq: Every day | ORAL | Status: DC
  Administered 2012-07-30 – 2012-08-01 (×3): 900 mg via ORAL
  Filled 2012-07-30 (×4): qty 3

## 2012-07-30 MED ORDER — BACLOFEN 20 MG PO TABS
20.0000 mg | ORAL_TABLET | Freq: Every day | ORAL | Status: DC
  Administered 2012-07-31 – 2012-08-02 (×3): 20 mg via ORAL
  Filled 2012-07-30 (×3): qty 1

## 2012-07-30 MED ORDER — ENOXAPARIN SODIUM 100 MG/ML ~~LOC~~ SOLN
100.0000 mg | Freq: Once | SUBCUTANEOUS | Status: DC
  Filled 2012-07-30: qty 1

## 2012-07-30 MED ORDER — FENTANYL 12 MCG/HR TD PT72
37.5000 ug | MEDICATED_PATCH | TRANSDERMAL | Status: DC
  Administered 2012-07-31: 37.5 ug via TRANSDERMAL
  Filled 2012-07-30: qty 1

## 2012-07-30 MED ORDER — MERCAPTOPURINE 50 MG PO TABS
100.0000 mg | ORAL_TABLET | Freq: Every day | ORAL | Status: DC
  Administered 2012-07-31 – 2012-08-02 (×3): 100 mg via ORAL
  Filled 2012-07-30 (×4): qty 2

## 2012-07-30 MED ORDER — ENOXAPARIN SODIUM 100 MG/ML ~~LOC~~ SOLN
100.0000 mg | Freq: Two times a day (BID) | SUBCUTANEOUS | Status: DC
  Administered 2012-07-30: 100 mg via SUBCUTANEOUS
  Filled 2012-07-30 (×2): qty 1

## 2012-07-30 MED ORDER — PIPERACILLIN-TAZOBACTAM 3.375 G IVPB
3.3750 g | Freq: Three times a day (TID) | INTRAVENOUS | Status: DC
  Administered 2012-07-31 – 2012-08-02 (×8): 3.375 g via INTRAVENOUS
  Filled 2012-07-30 (×9): qty 50

## 2012-07-30 MED ORDER — VANCOMYCIN HCL IN DEXTROSE 1-5 GM/200ML-% IV SOLN
1000.0000 mg | Freq: Once | INTRAVENOUS | Status: AC
  Administered 2012-07-30: 1000 mg via INTRAVENOUS
  Filled 2012-07-30: qty 200

## 2012-07-30 MED ORDER — PIPERACILLIN-TAZOBACTAM 3.375 G IVPB
3.3750 g | Freq: Once | INTRAVENOUS | Status: AC
  Administered 2012-07-30: 3.375 g via INTRAVENOUS
  Filled 2012-07-30: qty 50

## 2012-07-30 MED ORDER — GUAIFENESIN ER 600 MG PO TB12
600.0000 mg | ORAL_TABLET | Freq: Two times a day (BID) | ORAL | Status: DC
  Administered 2012-07-30 – 2012-08-02 (×6): 600 mg via ORAL
  Filled 2012-07-30 (×7): qty 1

## 2012-07-30 NOTE — Progress Notes (Signed)
ANTIBIOTIC CONSULT NOTE - INITIAL  Pharmacy Consult for Vancomycin/Zosyn Indication: Persistent/Recurrent Lung Infection  Allergies  Allergen Reactions  . Metronidazole     REACTION: neuropathy    Patient Measurements: Height: 6' 0.05" (183 cm) Weight: 220 lb 14.4 oz (100.2 kg) IBW/kg (Calculated) : 77.71   Vital Signs: Temp: 98.3 F (36.8 C) (02/18 2200) Temp src: Oral (02/18 2200) BP: 116/72 mmHg (02/18 2200) Pulse Rate: 92 (02/18 2200) Intake/Output from previous day:   Intake/Output from this shift: Total I/O In: 240 [P.O.:240] Out: -   Labs:  Recent Labs  07/30/12 1728  WBC 12.0*  HGB 14.4  PLT 215  CREATININE 1.84*   Estimated Creatinine Clearance: 72 ml/min (by C-G formula based on Cr of 1.84). No results found for this basename: VANCOTROUGH, VANCOPEAK, VANCORANDOM, GENTTROUGH, GENTPEAK, GENTRANDOM, TOBRATROUGH, TOBRAPEAK, TOBRARND, AMIKACINPEAK, AMIKACINTROU, AMIKACIN,  in the last 72 hours   Microbiology: No results found for this or any previous visit (from the past 720 hour(s)).  Medical History: Past Medical History  Diagnosis Date  . Hermansky-Pudlak syndrome     colitis, albinism,fibrosis, followed by [facility] renal  . Pneumonia, organism unspecified   . Pain in limb   . Other specified cardiac dysrhythmias   . Allergic rhinitis, cause unspecified   . Allergy, unspecified not elsewhere classified   . Eustachian tube dysfunction   . Generalized anxiety disorder   . Other acne   . Intrinsic asthma, unspecified   . Disorder of bone and cartilage, unspecified   . Other disturbances of aromatic amino-acid metabolism   . Unspecified visual loss   . Universal ulcerative (chronic) colitis   . Iron deficiency anemia, unspecified   . Other and unspecified noninfectious gastroenteritis and colitis   . S/p total colectomy 2012    Medications:  Scheduled:  . [START ON 07/31/2012] baclofen  20 mg Oral Daily  . budesonide-formoterol  2 puff Inhalation  BID  . [START ON 07/31/2012] fentaNYL  37.5 mcg Transdermal Q72H  . gabapentin  900 mg Oral QHS  . guaiFENesin  600 mg Oral BID  . [START ON 07/31/2012] mercaptopurine  100 mg Oral Daily  . methylPREDNISolone (SOLU-MEDROL) injection  80 mg Intravenous Q12H  . [START ON 07/31/2012] pantoprazole  40 mg Oral Daily  . [COMPLETED] piperacillin-tazobactam (ZOSYN)  IV  3.375 g Intravenous Once  . [START ON 07/31/2012] piperacillin-tazobactam (ZOSYN)  IV  3.375 g Intravenous Q8H  . sodium chloride  3 mL Intravenous Q12H  . sodium chloride  3 mL Intravenous Q12H  . [START ON 07/31/2012] terbinafine  250 mg Oral Daily  . [START ON 07/31/2012] vancomycin  1,250 mg Intravenous Q12H  . [COMPLETED] vancomycin  1,000 mg Intravenous Once  . [DISCONTINUED] enoxaparin (LOVENOX) injection  100 mg Subcutaneous Q12H  . [DISCONTINUED] enoxaparin (LOVENOX) injection  100 mg Subcutaneous Once   Infusions:  . [DISCONTINUED] sodium chloride     Assessment: 32 yo with history of Hermansky-Pudlak syndrome with lung, colon, rectal, skin and dysfunctional platelet/bleeding involvement.  MD ordering Vanc and Zosyn for lung infection.  Goal of Therapy:  Vancomycin trough level 15-20 mcg/ml  Plan:   Zosyn 3.375Gm IV q8h EI infusion.  Vancomycin 1250mg  IV q12h.  CrCl~61 (N)  F/U SCr/levels as needed.  Lorenza Evangelist 07/30/2012,11:38 PM

## 2012-07-30 NOTE — Telephone Encounter (Signed)
Spoke with patient's driver; pt will be here today at 2:00pm to see CY since we had a cancellation.

## 2012-07-30 NOTE — Telephone Encounter (Signed)
Per CY-patient can come in to see TP today or if he has a PCP he can see them.

## 2012-07-30 NOTE — H&P (Signed)
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  He has completed a course of augmentin and was started on doxy/cipro about 4 days ago.  His prednisone has also been increased.  He has also been on a antifungal agent for his groin wound which is improving for 3 weeks.  He denies any fevers or cough.  He denies any le pain, swelling or edema.  He denies any hemoptysis.  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  No recent bleeding issues.  No cp.  No abd pain.  He has also rectal involvment of his syndrome and denies any rectal bleeding.  He gets much of his care at [facility].  Sent here for vq scan to r/o PE and it was low probability.  U/s of ble shows a new dvt on prelim report.  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  He has [facility] several [facility] and thinks there may be some "old clot" there.  Found to be hypoxic in ED which is reason for admission in addition to unclear new DVT.  Review of Systems:  Positive and negative as per HPI otherwise all other systems are negative  Past Medical History: Past Medical History  Diagnosis Date  . Hermansky-Pudlak syndrome     colitis, albinism,fibrosis, followed by [facility] renal  . Pneumonia, organism unspecified   . Pain in limb   . Other specified cardiac dysrhythmias   . Allergic rhinitis, cause unspecified   . Allergy, unspecified not elsewhere classified   . Eustachian tube dysfunction   . Generalized anxiety disorder   . Other acne   . Intrinsic asthma,  unspecified   . Disorder of bone and cartilage, unspecified   . Other disturbances of aromatic amino-acid metabolism   . Unspecified visual loss   . Universal ulcerative (chronic) colitis   . Iron deficiency anemia, unspecified   . Other and unspecified noninfectious gastroenteritis and colitis   . S/p total colectomy 2012   Past Surgical History  Procedure Laterality Date  . Nih w/u pulm w/u mild asthma ned fibrosis  01-2000  . [facility] admit for observation  09-01-2003  . Head ct w/o nml mod l mastoid effusion  06-09-08  . Sllep study mild periodic limb movements  08-25-2004  . Colonoscopy complete unch coloitis start remicade  11-1999  . Colonoscopy colitis  09-1999  . Pft's (dr young) no fibrosis  12-09-2001    Medications: Prior to Admission medications   Medication Sig Start Date End Date Taking? Authorizing Provider  albuterol (PROAIR HFA) 108 (90 BASE) MCG/ACT inhaler Inhale 2 puffs into the lungs 4 (four) times daily as needed for wheezing or shortness of breath. 11/30/11 11/29/12 Yes Clinton D Young, MD  baclofen (LIORESAL) 20 MG tablet Take 20 mg by mouth daily.   Yes Historical Provider, MD  budesonide-formoterol (SYMBICORT) 80-4.5 MCG/ACT inhaler Inhale 2 puffs into the lungs 2 (two) times daily. Rinse mouth 11/30/11 11/29/12 Yes Clinton  Magda Kiel, MD  ciprofloxacin (CIPRO) 500 MG tablet Take 500 mg by mouth 2 (two) times daily. 10 day course of therapy started 07/26/12.   Yes Historical Provider, MD  doxycycline (VIBRA-TABS) 100 MG tablet Take 100 mg by mouth 2 (two) times daily. 10 day course of therapy started 07/26/12.   Yes Historical Provider, MD  esomeprazole (NEXIUM) 40 MG capsule Take 40 mg by mouth daily before breakfast.     Yes Historical Provider, MD  fentaNYL (DURAGESIC - DOSED MCG/HR) 12 MCG/HR Place 3 patches onto the skin every 3 (three) days.   Yes Historical Provider, MD  gabapentin (NEURONTIN) 300 MG capsule Take 900 mg by mouth at bedtime.    Yes Historical Provider,  MD  mercaptopurine (PURINETHOL) 50 MG tablet Take 100 mg by mouth daily.  06/25/12  Yes Historical Provider, MD  terbinafine (LAMISIL) 250 MG tablet Take 1 tablet by mouth daily. 07/05/12  Yes Historical Provider, MD  Respiratory Therapy Supplies (FLUTTER) DEVI Blow through 4 times per set,    3 sets per day when needed 11/30/11 11/29/12  Waymon Budge, MD    Allergies:   Allergies  Allergen Reactions  . Metronidazole     REACTION: neuropathy    Social History:  reports that he has never smoked. He has never used smokeless tobacco. He reports that he does not drink alcohol or use illicit drugs.  Family History: Family History  Problem Relation Age of Onset  . Asthma      grandmother-second hand smoke  . COPD      grandmother  . Asthma Father   . Other Cousin     HPS-1    Physical Exam: Filed Vitals:   07/30/12 1622 07/30/12 1623 07/30/12 1924  BP: 120/92  127/87  Pulse: 107    Temp: 97.7 F (36.5 C)  98.2 F (36.8 C)  TempSrc: Oral  Oral  Resp: 24  15  Height:   6' 0.05" (1.83 m)  Weight:   100.2 kg (220 lb 14.4 oz)  SpO2: 90% 99% 100%   General appearance: alert, cooperative and mild distress albinism Neck: no JVD and supple, symmetrical, trachea midline Lungs: clear to auscultation bilaterally Heart: regular rate and rhythm, S1, S2 normal, no murmur, click, rub or gallop Abdomen: soft, non-tender; bowel sounds normal; no masses,  no organomegaly Extremities: extremities normal, atraumatic, no cyanosis or edema Pulses: 2+ and symmetric Skin: Skin color, texture, turgor normal. No rashes or lesions has a open small wound to left groin area with no drainage or evidence of active infection no erythema Neurologic: Grossly normal    Labs on Admission:   Recent Labs  07/30/12 1728  NA 137  K 3.6  CL 101  CO2 24  GLUCOSE 80  BUN 31*  CREATININE 1.84*  CALCIUM 9.2    Recent Labs  07/30/12 1728  WBC 12.0*  NEUTROABS 9.4*  HGB 14.4  HCT 43.1  MCV 79.1   PLT 215    Radiological Exams on Admission: Dg Chest 2 View  07/30/2012  *RADIOLOGY REPORT*  Clinical Data: Dyspnea and recent bronchitis  CHEST - 2 VIEW  Comparison: July 16, 2012  Findings: There is persistent central peribronchial thickening. There is patchy interstitial infiltrate in the left upper lobe. There is subtle increased opacity in the right base, suspicious for an additional area of interstitial infiltrate.  Elsewhere, the lungs are clear.  The heart size and pulmonary vascularity normal. No adenopathy.  IMPRESSION:  Patchy interstitial infiltrate  left upper lobe and right base.  Persistent peribronchial thickening.  No adenopathy. No frank airspace consolidation.   Original Report Authenticated By: Bretta Bang, M.D.    Dg Chest 2 View  07/16/2012  *RADIOLOGY REPORT*  Clinical Data: Acute bronchitis with cough and shortness of breath  CHEST - 2 VIEW  Comparison:  December 23, 2009  Findings:  There is mild central peribronchial thickening, probably due to bronchitis.  There is no edema or consolidation.  The heart size and pulmonary vascularity are normal.  No bone lesions.  No adenopathy.  IMPRESSION: Central peribronchial thickening, probably indicative of bronchitis.  No frank edema or consolidation.   Original Report Authenticated By: Bretta Bang, M.D.    Nm Pulmonary Perf And Vent  07/30/2012  *RADIOLOGY REPORT*  Clinical Data:  32 year old male with shortness of breath.  NUCLEAR MEDICINE VENTILATION - PERFUSION LUNG SCAN  Technique:  Wash-in, equilibrium, and wash-out phase ventilation images were obtained using Xe-133 gas.  Perfusion images were obtained in multiple projections after intravenous injection of Tc- 64m MAA.  Radiopharmaceuticals: 41.0 mCi technetium 28m labeled DTPA inhalation 6.0 mCi Tc-74m MAA.  Comparison:  Chest radiographs 1505 hours the same day.  Findings: Comparison chest radiograph demonstrates increased interstitial opacity with no pleural effusion or  consolidation.  Ventilation imaging demonstrates artifact due to agent clumping, but fairly homogeneous ventilation distribution allowing for this. No obvious ventilation defect.  Perfusion imaging demonstrates superimposed artifact from the ventilation agent.  No definite perfusion defect.  IMPRESSION: Low probability of acute pulmonary embolism.   Original Report Authenticated By: Erskine Speed, M.D.     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  He has been on augmentin (course completed), cipro/doxy po for 4 days and lamisil for 3 weeks for this fungal skin infection.  Also ultrasound of ble prelim report showing ?new dvt.  Will place pt on iv vanco/zosyn and on iv solumedrol for tonight.  Pt and his father are worried about being put on lovenox due to his propensity to bleed/platelet dysfunction caused by his sydrome which is warranted.  Will give one dose of lovenox tonight and request [facility] records particularly his old extremity ultrasounds from there.  Pulmonology has been consulted and will be seeing in the morning.  Possibly may need bronch.  Renal disease is at his baseline.  If this u/s really indeed shows a new acute DVT may need to get his hematologist involved with treatment plan long term for him as this would commit him to life long anticoagulation however in the setting of his other medical issues  this decision will be a complicated one.  Patient and his father are very knowledgeable about his syndrome, plan has been discussed with them and they agree to proceed.  Admit to tele.  Full code.  Daegon Deiss A 07/30/2012, 10:15 PM

## 2012-07-30 NOTE — Assessment & Plan Note (Signed)
Hermansky-Pudlak syndrome. I am concerned about his grossly low oxygen saturation of 91% on room air, clear chest exam and apparent distress. It may all just be hyperventilation. His kidney function won't tolerate contrast dye.[name] He has previous scarring that may just leave an indeterminate ventilation/perfusion scan, useful mostly for comparison with future studies. Plan-chest x-ray, D. Dimer, B. Natruretic peptide, VQ scan seeking to rule out PE

## 2012-07-30 NOTE — ED Notes (Signed)
Pt arrives from PCP's office for SOB. Pt reports that PCP wants him to have a VQ scan. Pt reports SOB x's a few weeks now. Pt denies recent long travel but states "I have an illness and spend a lot of time sitting and in bed." pt appeared SOB after walking from triage to room. sats 90% on RA.

## 2012-07-30 NOTE — Progress Notes (Addendum)
*  PRELIMINARY RESULTS* Vascular Ultrasound Lower extremity venous duplex has been completed.  Preliminary findings: Right = no evidence of DVT. Left = Acute DVT involving the popliteal vein.  Farrel Demark, RDMS, RVT  07/30/2012, 5:05 PM

## 2012-07-30 NOTE — ED Notes (Signed)
NS infusion held at this time. Pt being transported to Nuc Med just after IV insertion.

## 2012-07-30 NOTE — Telephone Encounter (Signed)
Spoke with pt He states having increased SOB since last ov and  prod cough with yellow sputum He would like appt to be seen today if possible No appts are open Please advise  Last ov 07/16/12 Next ov pending 07/15/13 Allergies  Allergen Reactions  . Metronidazole     REACTION: neuropathy

## 2012-07-30 NOTE — ED Notes (Signed)
Pt returned from Nuc Med

## 2012-07-30 NOTE — ED Provider Notes (Signed)
History     CSN: [id number]  Arrival date & time 07/30/12  1552   First MD Initiated Contact with Patient 07/30/12 1620      Chief Complaint  Patient presents with  . Shortness of Breath    (Consider location/radiation/quality/duration/timing/severity/associated sxs/prior treatment) Patient is a 32 y.o. male presenting with shortness of breath. The history is provided by the patient and a parent.  Shortness of Breath pt here with sob x 1 week that is progressively worse--no fever but some cough--just completed a course of abx--saw his pulmonologist today and sent here to r/o pe via V/Q due to his baseline renal insufficiency.h/o dvt in past--no fever, vomiting, diarrhea  Past Medical History  Diagnosis Date  . Hermansky-Pudlak syndrome     colitis, albinism,fibrosis, followed by [facility] renal  . Pneumonia, organism unspecified   . Pain in limb   . Other specified cardiac dysrhythmias   . Allergic rhinitis, cause unspecified   . Allergy, unspecified not elsewhere classified   . Eustachian tube dysfunction   . Generalized anxiety disorder   . Other acne   . Intrinsic asthma, unspecified   . Disorder of bone and cartilage, unspecified   . Other disturbances of aromatic amino-acid metabolism   . Unspecified visual loss   . Universal ulcerative (chronic) colitis   . Iron deficiency anemia, unspecified   . Other and unspecified noninfectious gastroenteritis and colitis   . S/p total colectomy 2012    Past Surgical History  Procedure Laterality Date  . Nih w/u pulm w/u mild asthma ned fibrosis  01-2000  . [facility] admit for observation  09-01-2003  . Head ct w/o nml mod l mastoid effusion  06-09-08  . Sllep study mild periodic limb movements  08-25-2004  . Colonoscopy complete unch coloitis start remicade  11-1999  . Colonoscopy colitis  09-1999  . Pft's (dr young) no fibrosis  12-09-2001    Family History  Problem Relation Age of Onset  . Asthma      grandmother-second hand  smoke  . COPD      grandmother  . Asthma Father   . Other Cousin     HPS-1    History  Substance Use Topics  . Smoking status: Never Smoker   . Smokeless tobacco: Never Used  . Alcohol Use: No     Comment: very little      Review of Systems  Respiratory: Positive for shortness of breath.   All other systems reviewed and are negative.    Allergies  Metronidazole  Home Medications   Current Outpatient Rx  Name  Route  Sig  Dispense  Refill  . albuterol (PROAIR HFA) 108 (90 BASE) MCG/ACT inhaler   Inhalation   Inhale 2 puffs into the lungs 4 (four) times daily as needed for wheezing or shortness of breath.   1 Inhaler   prn   . baclofen (LIORESAL) 20 MG tablet   Oral   Take 20 mg by mouth daily.         . budesonide-formoterol (SYMBICORT) 80-4.5 MCG/ACT inhaler   Inhalation   Inhale 2 puffs into the lungs 2 (two) times daily. Rinse mouth   1 Inhaler   prn   . esomeprazole (NEXIUM) 40 MG capsule   Oral   Take 40 mg by mouth daily before breakfast.           . gabapentin (NEURONTIN) 300 MG capsule   Oral   Take 900 mg by mouth daily.         [name]  mercaptopurine (PURINETHOL) 50 MG tablet      Take as directed         . promethazine (PHENERGAN) 25 MG tablet      Take as directed as needed         . Respiratory Therapy Supplies (FLUTTER) DEVI      Blow through 4 times per set,    3 sets per day when needed   1 each   0   . terbinafine (LAMISIL) 250 MG tablet   Oral   Take 1 tablet by mouth daily.           BP 120/92  Pulse 107  Temp(Src) 97.7 F (36.5 C) (Oral)  Resp 24  SpO2 99%  Physical Exam  Nursing note and vitals reviewed. Constitutional: He is oriented to person, place, and time. He appears well-developed and well-nourished.  Non-toxic appearance. No distress.  HENT:  Head: Normocephalic and atraumatic.  Eyes: Conjunctivae, EOM and lids are normal. Pupils are equal, round, and reactive to light.  Neck: Normal range of  motion. Neck supple. No tracheal deviation present. No mass present.  Cardiovascular: Regular rhythm and normal heart sounds.  Tachycardia present.  Exam reveals no gallop.   No murmur heard. Pulmonary/Chest: Effort normal. No stridor. No respiratory distress. He has decreased breath sounds. He has no wheezes. He has no rhonchi. He has no rales.  Abdominal: Soft. Normal appearance and bowel sounds are normal. He exhibits no distension. There is no tenderness. There is no rebound and no CVA tenderness.  Musculoskeletal: Normal range of motion. He exhibits no edema and no tenderness.  Neurological: He is alert and oriented to person, place, and time. He has normal strength. No cranial nerve deficit or sensory deficit. GCS eye subscore is 4. GCS verbal subscore is 5. GCS motor subscore is 6.  Skin: Skin is warm and dry. No abrasion and no rash noted.  Psychiatric: He has a normal mood and affect. His speech is normal and behavior is normal.    ED Course  Procedures (including critical care time)  Labs Reviewed  CBC WITH DIFFERENTIAL  BASIC METABOLIC PANEL   Dg Chest 2 View  07/30/2012  *RADIOLOGY REPORT*  Clinical Data: Dyspnea and recent bronchitis  CHEST - 2 VIEW  Comparison: July 16, 2012  Findings: There is persistent central peribronchial thickening. There is patchy interstitial infiltrate in the left upper lobe. There is subtle increased opacity in the right base, suspicious for an additional area of interstitial infiltrate.  Elsewhere, the lungs are clear.  The heart size and pulmonary vascularity normal. No adenopathy.  IMPRESSION:  Patchy interstitial infiltrate left upper lobe and right base.  Persistent peribronchial thickening.  No adenopathy. No frank airspace consolidation.   Original Report Authenticated By: Bretta Bang, M.D.      No diagnosis found.    MDM  Pt given lovenox and vanc/zosyn --will be admitted by triad        Toy Baker, MD 07/30/12 1955

## 2012-07-30 NOTE — Patient Instructions (Addendum)
Order- CXR  Dx dyspnea, recent bronchitis  Order- lab- D-dimer, BNP dx dyspnea  Order- Radiology/ Ventilation/ Perfusion lung scan      Possible pulmonary embolism

## 2012-07-30 NOTE — Progress Notes (Signed)
ANTICOAGULATION CONSULT NOTE - Initial Consult  Pharmacy Consult for Lovenox Indication: new DVT  Allergies  Allergen Reactions  . Metronidazole     REACTION: neuropathy    Patient Measurements:    Vital Signs: Temp: 98.2 F (36.8 C) (02/18 1924) Temp src: Oral (02/18 1924) BP: 127/87 mmHg (02/18 1924) Pulse Rate: 107 (02/18 1622)  Labs:  Recent Labs  07/30/12 1728 07/30/12 1735  HGB 14.4  --   HCT 43.1  --   PLT 215  --   APTT  --  31  LABPROT  --  13.5  INR  --  1.04  CREATININE 1.84*  --     The CrCl is unknown because both a height and weight (above a minimum accepted value) are required for this calculation.   Medical History: Past Medical History  Diagnosis Date  . Hermansky-Pudlak syndrome     colitis, albinism,fibrosis, followed by [facility] renal  . Pneumonia, organism unspecified   . Pain in limb   . Other specified cardiac dysrhythmias   . Allergic rhinitis, cause unspecified   . Allergy, unspecified not elsewhere classified   . Eustachian tube dysfunction   . Generalized anxiety disorder   . Other acne   . Intrinsic asthma, unspecified   . Disorder of bone and cartilage, unspecified   . Other disturbances of aromatic amino-acid metabolism   . Unspecified visual loss   . Universal ulcerative (chronic) colitis   . Iron deficiency anemia, unspecified   . Other and unspecified noninfectious gastroenteritis and colitis   . S/p total colectomy 2012    Assessment:  31 yom presented 2/18 with SOB.  LLE doppler revealed acute DVT in the L leg involving the popliteal vein.  V/Q scan with low probability of acute PE. MD ordering to start Lovenox full dose.  Patient wts 100.2 kg last documented today.  Scr 1.84, CrCl 72 ml/min.  H/H wnl, plts okay.  Goal of Therapy:  Anti-Xa level 0.6-1.2 units/ml 4hrs after LMWH dose given Monitor platelets by anticoagulation protocol: Yes   Plan:   Lovenox 100 mg sq q12h  CBC q72 hours while on  Lovenox  Pharmacy will f/u  Geoffry Paradise, PharmD, BCPS Pager: [phone] 7:59 PM Pharmacy #: [id number]

## 2012-07-30 NOTE — Progress Notes (Signed)
Subjective:    Patient ID: [name], male    DOB: 01-11-1981, 32 y.o.   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   Here with friend [name] Last here August 09, 2010 Currently says breathing is pretty good. Had temp 100 degrees x 1 week, sore throat, tender left cervical node. Had dieted and lost 40 lbs since off prednisone x 6 months. Now on 6-mercaptopurine (which drops WBC to 3000) and Remicade. These drugs are managed by Dr Sherrill/ Heme Onc and Dr Schooler/ GI. Denies wheeze, phlegm. Took oxycodone this AM for pains. For the past year has had some dry cough. Concerned about this as an early fibrosis marker. Wants PFT every 6 months. He plans to go( unscheduled so far) for CXR and [facility] so he asks not to do a cxr here so as to minimize radiation exposure.    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   Since last here he had a total colectomy in 2012. Had ER visit at [facility] 03/24/2011 and CT scan suggested "something in lung" which he wanted to review. Treated with Levaquin. Has also had ER visit for kidney stone. Has stage III renal disease with 20% function-not known why. CXR 11/26/11- he reports "much clearer". Images were available on discs he brought from [facility]. We reviewed the images together. It looks as if he was dealing with a pneumonia in October with some residual density-pneumonia versus atelectasis-on the recent chest x-ray. He has minimal cough and no acute chest symptoms. Some of his interstitial markings may have been a little prominent but there was no dramatic interstitial fibrosis.  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 ACUTE VISIT: about 1 week or so ago started having cough, congestion, and had stomach flu over the weekend; noticed increase in mucus-started abx yesterday and has reduced the mucus amount. Major concern was breathing issues-felt unable to get breath and go any further. Noiced wheezing as well.We had called in augmentin yesterday to start because of his concern he was getting pneumonia. Describes 2 weeks of increased chest congestion and chest tightness but very productive cough. GI viral gastroenteritis pattern over this weekend seems to be a different problem. We had called in Augmentin which further aggravated loose stools through his colostomy. He is not on a routine probiotic. Today feeling better. Denies fever or sore throat.  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 ACUTE VISIT: SOB increased and yellow productive cough, labored breathing when talking,etc. Feelings of drowning and passing out; was given Doxycycline and Cipro We had stopped Augmentin at last visit because of watery diarrhea attributed to the antibiotic, although it may have been a separate viral gastroenteritis. He now says he chose to continue the Augmentin. We were closed first no last week he called the on-call doctor to get prescriptions for doxycycline and Cipro. Prednisone already being taken for his colitis was increased to 40 mg daily for wheezing. Cough was very productive. Today he asks he worked in, reporting more shortness of breath in the last few days "scary". Using rescue inhaler and resumed Symbicort for  modest benefit. He notices some wheezing lying down but not when he is upright. Arthralgias in   knees, no rash, fever or purulent sputum. He had past history of DVT complicating his colectomy by report. CXR 07/19/12- IMPRESSION:  Central peribronchial thickening, probably indicative of  bronchitis. No frank edema or consolidation.  Original Report Authenticated By: Bretta Bang, M.D.  ROS-see HPI Constitutional:   No-   weight loss, night sweats, fevers, chills, +fatigue, lassitude. HEENT:   No-  headaches, difficulty swallowing, tooth/dental problems, sore throat,       No-  sneezing, itching, ear ache, nasal congestion, post nasal drip,  CV:  No-   chest pain, orthopnea, PND, swelling in lower extremities, anasarca, dizziness, palpitations Resp: + shortness of breath with exertion or at rest.              + productive cough,  + non-productive cough,  No- coughing up of blood.              N+  change in color of mucus.  No- wheezing.   Skin: No-   rash or lesions. GI:  No-   heartburn, indigestion, abdominal pain, + resolving nausea, vomiting,  GU:  MS:  No-   joint pain or swelling.   Neuro-     nothing unusual Psych:  No- change in mood or affect. No depression or anxiety.  No memory loss.  OBJ- Physical Exam BP 112/76  Pulse 119  Ht 6' (1.829 m)  Wt 221 lb (100.245 kg)  BMI 29.97 kg/m2  SpO2 91% General- Alert, Oriented, Affect-appropriate, Distress- none acute, albino, trim, Room Air Skin- rash-none, lesions- none, excoriation- none Lymphadenopathy- none Head- atraumatic            Eyes-  PERRLA, conjunctivae and secretions clear. +Strabismus, +nystagmus            Ears- Hearing, canals-normal            Nose- Clear, no-Septal dev, mucus, polyps, erosion, perforation             Throat- Mallampati II , mucosa clear , drainage- none, tonsils- atrophic Neck- flexible , trachea midline, no stridor , thyroid nl, carotid no bruit Chest - symmetrical excursion , unlabored           Heart/CV- RRR , no murmur , no gallop  , no rub, nl s1 s2                           -  JVD- none , edema-+trace, stasis changes- none, varices- none           Lung- clear to P&A, wheeze- none, cough+light , dullness-none, rub- none           Chest wall-  Abd- +colostomy Gen/ Rectal- Not done, not indicated Extrem- cyanosis- none, clubbing, none, atrophy- none, strength- nl. Homan's- neg bilaterally. Neuro- grossly intact to observation except nystagmus

## 2012-07-30 NOTE — ED Notes (Signed)
MD at bedside. 

## 2012-07-31 ENCOUNTER — Telehealth: Payer: Self-pay | Admitting: Internal Medicine

## 2012-07-31 ENCOUNTER — Other Ambulatory Visit (HOSPITAL_COMMUNITY): Payer: Medicare Other

## 2012-07-31 DIAGNOSIS — J209 Acute bronchitis, unspecified: Secondary | ICD-10-CM

## 2012-07-31 LAB — BASIC METABOLIC PANEL
BUN: 33 mg/dL — ABNORMAL HIGH (ref 6–23)
Chloride: 103 mEq/L (ref 96–112)
GFR calc non Af Amer: 48 mL/min — ABNORMAL LOW (ref >90)
Glucose, Bld: 142 mg/dL — ABNORMAL HIGH (ref 70–99)
Potassium: 4.1 mEq/L (ref 3.5–5.1)
Sodium: 136 mEq/L (ref 135–145)

## 2012-07-31 LAB — D-DIMER, QUANTITATIVE: D-Dimer, Quant: 4.83 ug/mL-FEU — ABNORMAL HIGH (ref 0.00–0.48)

## 2012-07-31 LAB — CBC
HCT: 38.7 % — ABNORMAL LOW (ref 39.0–52.0)
Hemoglobin: 12.8 g/dL — ABNORMAL LOW (ref 13.0–17.0)
MCH: 26.1 pg (ref 26.0–34.0)
MCHC: 33.1 g/dL (ref 30.0–36.0)
RBC: 4.91 MIL/uL (ref 4.22–5.81)

## 2012-07-31 MED ORDER — ENOXAPARIN SODIUM 100 MG/ML ~~LOC~~ SOLN
1.0000 mg/kg | Freq: Two times a day (BID) | SUBCUTANEOUS | Status: DC
  Administered 2012-07-31 – 2012-08-02 (×4): 100 mg via SUBCUTANEOUS
  Filled 2012-07-31 (×6): qty 1

## 2012-07-31 MED ORDER — PATIENT'S GUIDE TO USING COUMADIN BOOK
Freq: Once | Status: AC
  Administered 2012-07-31: 18:00:00
  Filled 2012-07-31: qty 1

## 2012-07-31 MED ORDER — VITAMIN K 100 MCG PO TABS
100.0000 ug | ORAL_TABLET | Freq: Every day | ORAL | Status: DC
  Administered 2012-07-31 – 2012-08-02 (×3): 100 ug via ORAL
  Filled 2012-07-31 (×3): qty 1

## 2012-07-31 MED ORDER — LORAZEPAM 0.5 MG PO TABS
0.5000 mg | ORAL_TABLET | Freq: Four times a day (QID) | ORAL | Status: DC | PRN
  Administered 2012-08-01: 0.5 mg via ORAL
  Filled 2012-07-31: qty 1

## 2012-07-31 MED ORDER — WARFARIN - PHARMACIST DOSING INPATIENT: Freq: Every day | Status: DC

## 2012-07-31 MED ORDER — WARFARIN VIDEO: Freq: Once | Status: DC

## 2012-07-31 MED ORDER — PREDNISONE 20 MG PO TABS
40.0000 mg | ORAL_TABLET | Freq: Every day | ORAL | Status: DC
  Administered 2012-08-01 – 2012-08-02 (×2): 40 mg via ORAL
  Filled 2012-07-31 (×3): qty 2

## 2012-07-31 MED ORDER — WARFARIN SODIUM 7.5 MG PO TABS
7.5000 mg | ORAL_TABLET | Freq: Once | ORAL | Status: AC
  Administered 2012-07-31: 7.5 mg via ORAL
  Filled 2012-07-31: qty 1

## 2012-07-31 NOTE — Progress Notes (Signed)
Pt HR elevates >140 at times when ambulating. Pt takes off oxygen to ambulate to bathroom. Pt asymptomatic when ambulating. HR WNL when pt is at rest. Will continue to monitor pt.

## 2012-07-31 NOTE — Progress Notes (Signed)
Patient's heart rate up to 163 while laying in bed.  Patient asymptomatic. Dr. Benjamine Mola paged with this information.  Will monitor.

## 2012-07-31 NOTE — Progress Notes (Signed)
Consult received for Lovenox, met with pt at bedside. He stated he has been on it in the past and knows how to administer it. He also has insurance coverage with prescription coverage. Pt did not have any questions or concerns that CM address at this time.

## 2012-07-31 NOTE — Progress Notes (Signed)
ANTICOAGULATION CONSULT NOTE - Initial Consult  Pharmacy Consult for Coumadin Indication: DVT  Allergies  Allergen Reactions  . Metronidazole     REACTION: neuropathy   Patient Measurements: Height: 6' 0.05" (183 cm) Weight: 220 lb 14.4 oz (100.2 kg) IBW/kg (Calculated) : 77.71   Recent Labs  07/30/12 1728 07/30/12 1735 07/31/12 0528  HGB 14.4  --  12.8*  HCT 43.1  --  38.7*  PLT 215  --  220  APTT  --  31  --   LABPROT  --  13.5  --   INR  --  1.04  --   CREATININE 1.84*  --  1.84*   Estimated Creatinine Clearance: 72 ml/min (by C-G formula based on Cr of 1.84).  Assessment:  Lovenox started today for acute DVT.   Pharmacy asked to start Coumadin very cautiously given potential for bleeding platelet abnormalities d/t Hermansky Pudlak syndrome. Dr. Benjamine Mola has consulted with the patient's hematologist in [facility].  To meet CHEST guidelines, will need 5 day overlap of Lovenox/Coumadin and INR therapeutic x 24hrs before stopping Lovenox.   Baseline INR 1.04 yesterday.  CBC ok today.  Tolerating diet.  Prednisone and broad-spectrum abx will likely increase sensitivity to Coumadin.   Mercaptopurine can either increase or decrease Coumadin's effects.   Goal of Therapy:  INR 2-3 (aim for low-end of the range) Heparin level 0.6-1.2 units/ml Monitor platelets by anticoagulation protocol: Yes   Plan:   Give Coumadin 7.5mg  today. (Our dosing nomogram calls for 10mg  on day #1)  Daily PT/INR.  Coumadin education. Ordered booklet and video.  Charolotte Eke, PharmD, pager [phone]. 07/31/2012,3:39 PM.

## 2012-07-31 NOTE — Progress Notes (Signed)
Explained to patient not to get up and walk in the room due to clot in left lower extremity, discussed with Dr. Zenaida Niece.  While patient understands not to ambulate in the room, patient stated that he will continue getting up to the bathroom due to he needs to empty his colostomy.  Patient stated he will try to minimize his trips to the bathroom.

## 2012-07-31 NOTE — Progress Notes (Signed)
Clinical Social Work Department BRIEF PSYCHOSOCIAL ASSESSMENT 07/31/2012  Patient:  [name], [name]     Account Number:  [id number]     Admit date:  07/30/2012  Clinical Social Worker:  Doroteo Glassman  Date/Time:  07/31/2012 03:56 PM  Referred by:  Physician  Date Referred:  07/31/2012 Referred for  Psychosocial assessment   Other Referral:   Interview type:  Patient Other interview type:    PSYCHOSOCIAL DATA Living Status:  FAMILY Admitted from facility:   Level of care:   Primary support name:  Kamir Selover Primary support relationship to patient:  PARENT Degree of support available:   adequate    CURRENT CONCERNS Current Concerns  Adjustment to Illness   Other Concerns:    SOCIAL WORK ASSESSMENT / PLAN Met with Pt to provide Pt with outpt information.    CSW spent a considerable amount of time with Pt discussing his disorder.  Pt reported hopelessness surrounding the course of this disorder and stated that he often thinks about suicide.  He stated that he has a wife and 2 kids and that he has no plans for suicide.  Pt reported that he considers how much it's costing to keep him alive vs how much his wife and children would have if he were dead.  Pt stated that he thinks about this often and questions the logic of being alive.    Pt reported failed attempts at therapy and medication management.  Pt stated that he is willing to try again and requested that CSW assist him in this search.  Pt and CSW discussed the therapy process and what's reasonable to expect from the client-therapist relationship.  Pt and CSW discussed, too, the role of the psychiatrist and what is reasonable to expect from this relationship.    Pt reported depression and anxiety surrounding his disorder and reported that he is very knowledgeable about his condition, thus he knows that it's likely that he will die from pulmonary fibrosis.  Pt stated that he is very involved with organizations related  to his condition and that he knows how this disorder plays out.    Pt reported that he relies heavily on his parents financially, thus they have a say in his life, much to his dismay.  His wife is employed but she doesn't earn enough to support the family so his parents pay the rent for their home.    CSW thanked Pt for his time and will begin to compile information for Pt on outpt therapist and psychiatrist, both here and [facility], as Pt has providers in that area, as well.    CSW suggested to MD that psych consult be placed due to Pt's continued thoughts of suicide, although Pt denied current SI, HI, AVH, paranoia, delusions.   Assessment/plan status:  Psychosocial Support/Ongoing Assessment of Needs Other assessment/ plan:   Information/referral to community resources:   will complie a list for Pt of outpt providers    PATIENT'S/FAMILY'S RESPONSE TO PLAN OF CARE: Pt thanked CSW for time and assistance.   CSW to continue to follow.  Providence Crosby, LCSWA Clinical Social Work [phone]

## 2012-07-31 NOTE — Progress Notes (Signed)
[facility] PROGRESS NOTE  [name] WUJ:[id number] DOB: [birth year] DOA: 07/30/2012 PCP: Sanda Linger, MD  Assessment/Plan: 1. DVT- acute- spoke with Hematology here who recommended lovenox bringe to coumadin- spoke with patient's hematologist in [facility] who also recommended lovenox bridge to coumadin--- she also recommended of vit K daily-- with follow up in her office to determine length of treatment (had DVT in 2012 after surgery as well) 2. Depression/anxiety- asked psych social worker to see 3. Hermansky Pudlak syndrome 4. Asthma- wean off IV steroids 5. ?PNA- treat with IV abx change to PO at d/c 6. CKD- stable 7. Leukocytosis- resolved  Code Status: full Family Communication: none Disposition Plan: home soon   Consultants:    Procedures:  duplex  Antibiotics:  vanc/zosyn  HPI/Subjective: No new c/o Feeling better  Objective: Filed Vitals:   07/31/12 0044 07/31/12 0657 07/31/12 0848 07/31/12 0936  BP:  119/73  110/52  Pulse:  83  104  Temp:  98.2 F (36.8 C)  97.6 F (36.4 C)  TempSrc:  Oral  Oral  Resp:  14  20  Height:      Weight:      SpO2: 97% 95% 95% 97%    Intake/Output Summary (Last 24 hours) at 07/31/12 1410 Last data filed at 07/31/12 9562  Gross per 24 hour  Intake    530 ml  Output    425 ml  Net    105 ml   Filed Weights   07/30/12 1924  Weight: 100.2 kg (220 lb 14.4 oz)    Exam:   General:  A+Ox3, NAD  Cardiovascular: rrr  Respiratory: clear anterior  Abdomen: +BS, soft, NT  Data Reviewed: Basic Metabolic Panel:  Recent Labs Lab 07/30/12 1728 07/31/12 0528  NA 137 136  K 3.6 4.1  CL 101 103  CO2 24 22  GLUCOSE 80 142*  BUN 31* 33*  CREATININE 1.84* 1.84*  CALCIUM 9.2 9.1   Liver Function Tests: No results found for this basename: AST, ALT, ALKPHOS, BILITOT, PROT, ALBUMIN,  in the last 168 hours No results found for this basename: LIPASE, AMYLASE,  in the last 168 hours No results  found for this basename: AMMONIA,  in the last 168 hours CBC:  Recent Labs Lab 07/30/12 1728 07/31/12 0528  WBC 12.0* 10.5  NEUTROABS 9.4*  --   HGB 14.4 12.8*  HCT 43.1 38.7*  MCV 79.1 78.8  PLT 215 220   Cardiac Enzymes: No results found for this basename: CKTOTAL, CKMB, CKMBINDEX, TROPONINI,  in the last 168 hours BNP (last 3 results)  Recent Labs  07/30/12 1510  PROBNP 83.0   CBG: No results found for this basename: GLUCAP,  in the last 168 hours  No results found for this or any previous visit (from the past 240 hour(s)).   Studies: Dg Chest 2 View  07/30/2012  *RADIOLOGY REPORT*  Clinical Data: Dyspnea and recent bronchitis  CHEST - 2 VIEW  Comparison: July 16, 2012  Findings: There is persistent central peribronchial thickening. There is patchy interstitial infiltrate in the left upper lobe. There is subtle increased opacity in the right base, suspicious for an additional area of interstitial infiltrate.  Elsewhere, the lungs are clear.  The heart size and pulmonary vascularity normal. No adenopathy.  IMPRESSION:  Patchy interstitial infiltrate left upper lobe and right base.  Persistent peribronchial thickening.  No adenopathy. No frank airspace consolidation.   Original Report Authenticated By: Bretta Bang, M.D.    Nm  Pulmonary Perf And Vent  07/30/2012  *RADIOLOGY REPORT*  Clinical Data:  32 year old male with shortness of breath.  NUCLEAR MEDICINE VENTILATION - PERFUSION LUNG SCAN  Technique:  Wash-in, equilibrium, and wash-out phase ventilation images were obtained using Xe-133 gas.  Perfusion images were obtained in multiple projections after intravenous injection of Tc- 83m MAA.  Radiopharmaceuticals: 41.0 mCi technetium 36m labeled DTPA inhalation 6.0 mCi Tc-55m MAA.  Comparison:  Chest radiographs 1505 hours the same day.  Findings: Comparison chest radiograph demonstrates increased interstitial opacity with no pleural effusion or consolidation.  Ventilation  imaging demonstrates artifact due to agent clumping, but fairly homogeneous ventilation distribution allowing for this. No obvious ventilation defect.  Perfusion imaging demonstrates superimposed artifact from the ventilation agent.  No definite perfusion defect.  IMPRESSION: Low probability of acute pulmonary embolism.   Original Report Authenticated By: Erskine Speed, M.D.     Scheduled Meds: . baclofen  20 mg Oral Daily  . budesonide-formoterol  2 puff Inhalation BID  . enoxaparin (LOVENOX) injection  1 mg/kg Subcutaneous Q12H  . fentaNYL  37.5 mcg Transdermal Q72H  . gabapentin  900 mg Oral QHS  . guaiFENesin  600 mg Oral BID  . mercaptopurine  100 mg Oral Daily  . pantoprazole  40 mg Oral Daily  . piperacillin-tazobactam (ZOSYN)  IV  3.375 g Intravenous Q8H  . [START ON 08/01/2012] predniSONE  40 mg Oral Q breakfast  . sodium chloride  3 mL Intravenous Q12H  . sodium chloride  3 mL Intravenous Q12H  . terbinafine  250 mg Oral Daily  . vancomycin  1,250 mg Intravenous Q12H   Continuous Infusions:   Principal Problem:   Acute respiratory failure with hypoxia Active Problems:   ALBINISM   ASTHMA, INTRINSIC NOS   COLITIS, UNIVERSAL ULCERATIVE   Hermansky-Pudlak syndrome   DVT (deep venous thrombosis)   CKD (chronic kidney disease) baseline Cr 1.8-1.9   Non-healing left groin open wound    Time spent: 35    Ardenia Stiner  [facility] Pager [phone]. If 8PM-8AM, please contact night-coverage at [email or web address], password [facility] 07/31/2012, 2:10 PM  LOS: 1 day

## 2012-07-31 NOTE — Progress Notes (Signed)
ANTICOAGULATION CONSULT NOTE - Initial Consult  Pharmacy Consult for Lovenox Indication: DVT  Allergies  Allergen Reactions  . Metronidazole     REACTION: neuropathy    Patient Measurements: Height: 6' 0.05" (183 cm) Weight: 220 lb 14.4 oz (100.2 kg) IBW/kg (Calculated) : 77.71    Recent Labs  07/30/12 1728 07/30/12 1735 07/31/12 0528  HGB 14.4  --  12.8*  HCT 43.1  --  38.7*  PLT 215  --  220  APTT  --  31  --   LABPROT  --  13.5  --   INR  --  1.04  --   CREATININE 1.84*  --  1.84*    Estimated Creatinine Clearance: 72 ml/min (by C-G formula based on Cr of 1.84).   Medical History: Past Medical History  Diagnosis Date  . Hermansky-Pudlak syndrome     colitis, albinism,fibrosis, followed by [facility] renal  . Pneumonia, organism unspecified   . Pain in limb   . Other specified cardiac dysrhythmias   . Allergic rhinitis, cause unspecified   . Allergy, unspecified not elsewhere classified   . Eustachian tube dysfunction   . Generalized anxiety disorder   . Other acne   . Intrinsic asthma, unspecified   . Disorder of bone and cartilage, unspecified   . Other disturbances of aromatic amino-acid metabolism   . Unspecified visual loss   . Universal ulcerative (chronic) colitis   . Iron deficiency anemia, unspecified   . Other and unspecified noninfectious gastroenteritis and colitis   . S/p total colectomy 2012    Assessment:  See previous pharmacy note regarding Lovenox treatment on 2/18  Labs stable today including elevated but unchanged SCr with est CrCl 72  Goal of Therapy:  Heparin level 0.6-1.2 units/ml Monitor platelets by anticoagulation protocol: Yes   Plan:  Restart Lovenox 100mg  SQ q12 as planned yesterday   Hessie Knows, PharmD, BCPS Pager [phone] 07/31/2012 12:50 PM

## 2012-07-31 NOTE — Telephone Encounter (Signed)
Will forward to Dr. Young as an FYI 

## 2012-07-31 NOTE — Consult Note (Signed)
WOC ostomy consult: Patient requested WOC Nurse for Education and Support  Stoma type/location: RLQ ileostomy Education provided: Patient is 2+ years out from ostomy creation at [facility] and is well versed in care and management strategies.  He is interested in multiple aspects of support, up to an including referral to an outpatient ostomy clinic on an as-needed basis.  I have provided him with two resources in this area, one at [facility] and one at [facility].  Additionally he is provided with the on-line web address for a support group that is monitored by board-certified WOC Nurses, [employer].  He appears amenable and pleased with these resources.  Additional questions are proposed regarding his supplies (he likes the [employer] lubricant and deodorizer, but Medicare does not allow a sufficient amount for his preference) and where to take suggestions for inventions and improvements in ostomy pouching system materials.  Patient is informed that all major manufacturers welcome ideas for product enhancements.  Suggestions are provided for alternatives to the [facility] he wears to diminish pouch "flopping" from side to side; he is referred to a resource by the name of Veterinary surgeon (www.ostomysecrets.com) that manufacturers underwear with a pouch sewn on the inside to accommodate pouches. I will remain available to this patient and the staff throughout this admission, but not follow.  Patient appears to need someone to reaffirm the knowledge he has gained over the past few years.  I feel one of the several options for support groups would be a good fit for him.  If I can be of further assistance, please reconsult. Thanks, Ladona Mow, MSN, RN, [facility], CWOCN [phone])

## 2012-08-01 DIAGNOSIS — F329 Major depressive disorder, single episode, unspecified: Secondary | ICD-10-CM

## 2012-08-01 DIAGNOSIS — F339 Major depressive disorder, recurrent, unspecified: Secondary | ICD-10-CM | POA: Diagnosis present

## 2012-08-01 DIAGNOSIS — I82409 Acute embolism and thrombosis of unspecified deep veins of unspecified lower extremity: Secondary | ICD-10-CM | POA: Insufficient documentation

## 2012-08-01 LAB — COMPREHENSIVE METABOLIC PANEL
Alkaline Phosphatase: 77 U/L (ref 39–117)
BUN: 32 mg/dL — ABNORMAL HIGH (ref 6–23)
Chloride: 109 mEq/L (ref 96–112)
Creatinine, Ser: 1.94 mg/dL — ABNORMAL HIGH (ref 0.50–1.35)
GFR calc Af Amer: 52 mL/min — ABNORMAL LOW (ref >90)
Glucose, Bld: 118 mg/dL — ABNORMAL HIGH (ref 70–99)
Potassium: 4 mEq/L (ref 3.5–5.1)
Total Bilirubin: 0.2 mg/dL — ABNORMAL LOW (ref 0.3–1.2)
Total Protein: 6.9 g/dL (ref 6.0–8.3)

## 2012-08-01 LAB — PROTIME-INR
INR: 1.11 (ref 0.00–1.49)
Prothrombin Time: 14.2 seconds (ref 11.6–15.2)

## 2012-08-01 LAB — CBC
MCH: 26.2 pg (ref 26.0–34.0)
MCHC: 32.8 g/dL (ref 30.0–36.0)
Platelets: 196 10*3/uL (ref 150–400)
RDW: 17 % — ABNORMAL HIGH (ref 11.5–15.5)

## 2012-08-01 MED ORDER — WARFARIN SODIUM 5 MG PO TABS
5.0000 mg | ORAL_TABLET | Freq: Once | ORAL | Status: AC
  Administered 2012-08-01: 5 mg via ORAL
  Filled 2012-08-01: qty 1

## 2012-08-01 NOTE — Consult Note (Signed)
Patient Identification:  [name] Date of Evaluation:  08/01/2012 Reason for Consult:  Depression  Referring Provider: Dr. 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  He has been taking antibiotic and due to other complicating symptoms is treated IV.  The long-range complication for this pt is development of interstitial fibrosis  Past Psychiatric History: This pt. struggles with discouragement and depression related to symptoms of his genetic condition and comorbid illnesses.    Past Medical History:     Past Medical History  Diagnosis Date  . Hermansky-Pudlak syndrome     colitis, albinism,fibrosis, followed by [facility] renal  . Pneumonia, organism unspecified   . Pain in limb   . Other specified cardiac dysrhythmias   . Allergic rhinitis, cause unspecified   . Allergy, unspecified not elsewhere classified   . Eustachian tube dysfunction   . Generalized anxiety disorder   . Other acne   . Intrinsic asthma, unspecified   . Disorder of bone and cartilage, unspecified   . Other disturbances of aromatic amino-acid metabolism   . Unspecified visual loss   . Universal ulcerative (chronic) colitis   . Iron deficiency anemia, unspecified   . Other and unspecified noninfectious gastroenteritis and colitis   . S/p total colectomy 2012       Past Surgical History  Procedure Laterality Date  . Nih w/u pulm w/u mild asthma ned fibrosis  01-2000  . [facility] admit for observation  09-01-2003  . Head ct w/o nml mod l mastoid effusion  06-09-08  . Sllep study mild periodic limb movements  08-25-2004  . Colonoscopy complete unch coloitis start remicade  11-1999  . Colonoscopy colitis  09-1999  . Pft's (dr young) no fibrosis  12-09-2001    Allergies:  Allergies  Allergen Reactions  . Metronidazole     REACTION: neuropathy    Current Medications:  Prior to Admission medications   Medication Sig Start  Date End Date Taking? Authorizing Provider  albuterol (PROAIR HFA) 108 (90 BASE) MCG/ACT inhaler Inhale 2 puffs into the lungs 4 (four) times daily as needed for wheezing or shortness of breath. 11/30/11 11/29/12 Yes Clinton D Young, MD  baclofen (LIORESAL) 20 MG tablet Take 20 mg by mouth daily.   Yes Historical Provider, MD  budesonide-formoterol (SYMBICORT) 80-4.5 MCG/ACT inhaler Inhale 2 puffs into the lungs 2 (two) times daily. Rinse mouth 11/30/11 11/29/12 Yes Clinton D Young, MD  ciprofloxacin (CIPRO) 500 MG tablet Take 500 mg by mouth 2 (two) times daily. 10 day course of therapy started 07/26/12.   Yes Historical Provider, MD  doxycycline (VIBRA-TABS) 100 MG tablet Take 100 mg by mouth 2 (two) times daily. 10 day course of therapy started 07/26/12.   Yes Historical Provider, MD  esomeprazole (NEXIUM) 40 MG capsule Take 40 mg by mouth daily before breakfast.     Yes Historical Provider, MD  fentaNYL (DURAGESIC - DOSED MCG/HR) 12 MCG/HR Place 3 patches onto the skin every 3 (three) days.   Yes Historical Provider, MD  gabapentin (NEURONTIN) 300 MG capsule Take 900 mg by mouth at bedtime.    Yes Historical Provider, MD  mercaptopurine (PURINETHOL) 50 MG tablet Take 100 mg by mouth daily.  06/25/12  Yes Historical Provider, MD  terbinafine (LAMISIL) 250 MG tablet Take 1 tablet by mouth daily. 07/05/12  Yes Historical Provider, MD  Respiratory Therapy Supplies (FLUTTER) DEVI Blow through 4 times per set,  3 sets per day when needed 11/30/11 11/29/12  Waymon Budge, MD    Social History:    reports that he has never smoked. He has never used smokeless tobacco. He reports that he does not drink alcohol or use illicit drugs.   Family History:    Family History  Problem Relation Age of Onset  . Asthma      grandmother-second hand smoke  . COPD      grandmother  . Asthma Father   . Other Cousin     HPS-1    Mental Status Examination/Evaluation: Objective:  Appearance: Casual and albinism:  white hair, eyelashes, pale complexion; poor vision  Eye Contact::  Fair  Speech:  Clear and Coherent, Normal Rate and well articulated  Volume:  Normal  Mood:  Depression  Affect:  Appropriate, Congruent, Depressed and aware of frustrations and conflicts  Thought Process:  Coherent, Goal Directed, Intact, Logical and predominant self-actualizing concepts; thwarted by physical limitations  Orientation:  Full (Time, Place, and Person)  Thought Content:  Rumination and goal oriented plans  Suicidal Thoughts:  Yes.  without intent/plan  Homicidal Thoughts:  No  Judgement:  Good  Insight:  Good   DIAGNOSIS:   AXIS I  Depression, suicidal ideation due to another medical/genetic condition  AXIS II  Deferred  AXIS III See medical notes.  AXIS IV economic problems, housing problems, occupational problems, other psychosocial or environmental problems, problems related to social environment, problems with primary support group and Pt realizes he is dependent upon his parents with regret; he strives to become independent  AXIS V 41-50 serious symptoms   Assessment/Plan:  Discussed with Dr. Benjamine Mola Pt has passive suicidal thoughts.   He is also highly motivated to be productive and becomes depressed when his symptoms flare.  At the moment he has IV tx for pneumonia.  He describes his genetic complications that caused him to take a long time to earn his BA.  He loves to work with computers.  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       He is not married but lives with GF and their daughter age 15 and her daughter age 20.  They are pressured by his parents to marry.  He is dependent upon parents who pay rent and they are buying the couple a house. His parents are helpful but conflict evolves when they pressure them to adopt his parent's values; practices.  He wants to be independent but realizes it is not possible.  His GF has a job but he believes  she is underpaid for the MA in finance she has.      He struggles with his work ethic, learned much earlier before all the complications of Hermansky-Pudlak syndrome emerged.  He struggles with the mental/intellectual conflict of being 'whom he out to be' vs. The reality of what his genetic inheritance dictates [both parents are carriers, dom. Recessive].  He asks insightful questions.  He agrees therapy would be helpful.  He is given referral to some resources.  He says he is not suicidal with plan but often thinks about it and has done extensive research on computer to learn how to kill one's self.     He may benefit greatly for himself and his family if he engages in therapy.  He agrees to consider it.  RECOMMENDATION:  1.  Pt has capacity 2.  Pt has passive suicidal ideation - will monitor no  sitter to night but may need one. 3.  Referred to resources to understand anxiety reduction.  4.  Pt may need inpatient transfer - will explore further; and plan to refer to outpatient therapeutic resources.  5.  Will follow pt.  Mickeal Skinner MD 08/01/2012 2:46 PM

## 2012-08-01 NOTE — Progress Notes (Signed)
[facility] PROGRESS NOTE  [name] JJK:[id number] DOB: [birth year] DOA: 07/30/2012 PCP: Sanda Linger, MD  Assessment/Plan: 1. DVT- acute- spoke with Hematology here who recommended lovenox bringe to coumadin- spoke with patient's hematologist in [facility] who also recommended lovenox bridge to coumadin--- she also recommended of vit K daily-- with follow up in her office to determine length of treatment (had DVT in 2012 after surgery as well) 2. Depression/anxiety- asked psych to see 3. Hermansky Pudlak syndrome 4. Asthma- wean off IV steroids to PO steroids 5. ?PNA- treat with IV abx change to PO at d/c 6. CKD- stable 7. Leukocytosis- resolved  Code Status: full Family Communication: none Disposition Plan: home soon   Consultants:  psych  Procedures:  duplex  Antibiotics:  vanc/zosyn  HPI/Subjective: Breathing better All questions answered  Objective: Filed Vitals:   07/31/12 2122 07/31/12 2200 08/01/12 0559 08/01/12 0839  BP:  113/61 105/58   Pulse:  60 92   Temp:  97.8 F (36.6 C) 98.2 F (36.8 C)   TempSrc:  Oral Oral   Resp:  18 18   Height:      Weight:      SpO2: 94% 91% 94% 95%    Intake/Output Summary (Last 24 hours) at 08/01/12 1317 Last data filed at 08/01/12 1005  Gross per 24 hour  Intake   1440 ml  Output      0 ml  Net   1440 ml   Filed Weights   07/30/12 1924  Weight: 100.2 kg (220 lb 14.4 oz)    Exam:   General:  A+Ox3, NAD  Cardiovascular: rrr  Respiratory: clear anterior  Abdomen: +BS, soft, NT  Data Reviewed: Basic Metabolic Panel:  Recent Labs Lab 07/30/12 1728 07/31/12 0528 08/01/12 0510  NA 137 136 141  K 3.6 4.1 4.0  CL 101 103 109  CO2 24 22 21   GLUCOSE 80 142* 118*  BUN 31* 33* 32*  CREATININE 1.84* 1.84* 1.94*  CALCIUM 9.2 9.1 8.4   Liver Function Tests:  Recent Labs Lab 08/01/12 0510  AST 13  ALT 17  ALKPHOS 77  BILITOT 0.2*  PROT 6.9  ALBUMIN 2.9*   No results  found for this basename: LIPASE, AMYLASE,  in the last 168 hours No results found for this basename: AMMONIA,  in the last 168 hours CBC:  Recent Labs Lab 07/30/12 1728 07/31/12 0528 08/01/12 0510  WBC 12.0* 10.5 15.6*  NEUTROABS 9.4*  --   --   HGB 14.4 12.8* 12.6*  HCT 43.1 38.7* 38.4*  MCV 79.1 78.8 79.8  PLT 215 220 196   Cardiac Enzymes: No results found for this basename: CKTOTAL, CKMB, CKMBINDEX, TROPONINI,  in the last 168 hours BNP (last 3 results)  Recent Labs  07/30/12 1510  PROBNP 83.0   CBG: No results found for this basename: GLUCAP,  in the last 168 hours  No results found for this or any previous visit (from the past 240 hour(s)).   Studies: Dg Chest 2 View  07/30/2012  *RADIOLOGY REPORT*  Clinical Data: Dyspnea and recent bronchitis  CHEST - 2 VIEW  Comparison: July 16, 2012  Findings: There is persistent central peribronchial thickening. There is patchy interstitial infiltrate in the left upper lobe. There is subtle increased opacity in the right base, suspicious for an additional area of interstitial infiltrate.  Elsewhere, the lungs are clear.  The heart size and pulmonary vascularity normal. No adenopathy.  IMPRESSION:  Patchy interstitial infiltrate left  upper lobe and right base.  Persistent peribronchial thickening.  No adenopathy. No frank airspace consolidation.   Original Report Authenticated By: Bretta Bang, M.D.    Nm Pulmonary Perf And Vent  07/30/2012  *RADIOLOGY REPORT*  Clinical Data:  32 year old male with shortness of breath.  NUCLEAR MEDICINE VENTILATION - PERFUSION LUNG SCAN  Technique:  Wash-in, equilibrium, and wash-out phase ventilation images were obtained using Xe-133 gas.  Perfusion images were obtained in multiple projections after intravenous injection of Tc- 11m MAA.  Radiopharmaceuticals: 41.0 mCi technetium 75m labeled DTPA inhalation 6.0 mCi Tc-33m MAA.  Comparison:  Chest radiographs 1505 hours the same day.  Findings:  Comparison chest radiograph demonstrates increased interstitial opacity with no pleural effusion or consolidation.  Ventilation imaging demonstrates artifact due to agent clumping, but fairly homogeneous ventilation distribution allowing for this. No obvious ventilation defect.  Perfusion imaging demonstrates superimposed artifact from the ventilation agent.  No definite perfusion defect.  IMPRESSION: Low probability of acute pulmonary embolism.   Original Report Authenticated By: Erskine Speed, M.D.     Scheduled Meds: . baclofen  20 mg Oral Daily  . budesonide-formoterol  2 puff Inhalation BID  . enoxaparin (LOVENOX) injection  1 mg/kg Subcutaneous Q12H  . fentaNYL  37.5 mcg Transdermal Q72H  . gabapentin  900 mg Oral QHS  . guaiFENesin  600 mg Oral BID  . mercaptopurine  100 mg Oral Daily  . pantoprazole  40 mg Oral Daily  . piperacillin-tazobactam (ZOSYN)  IV  3.375 g Intravenous Q8H  . predniSONE  40 mg Oral Q breakfast  . sodium chloride  3 mL Intravenous Q12H  . sodium chloride  3 mL Intravenous Q12H  . terbinafine  250 mg Oral Daily  . vancomycin  1,250 mg Intravenous Q12H  . vitamin k  100 mcg Oral Daily  . warfarin  5 mg Oral ONCE-1800  . warfarin   Does not apply Once  . Warfarin - Pharmacist Dosing Inpatient   Does not apply q1800   Continuous Infusions:   Principal Problem:   Acute respiratory failure with hypoxia Active Problems:   ALBINISM   ASTHMA, INTRINSIC NOS   COLITIS, UNIVERSAL ULCERATIVE   Hermansky-Pudlak syndrome   DVT (deep venous thrombosis)   CKD (chronic kidney disease) baseline Cr 1.8-1.9   Non-healing left groin open wound    Time spent: 35    [name]  [facility] Pager [phone]. If 8PM-8AM, please contact night-coverage at [email or web address], password [facility] 08/01/2012, 1:17 PM  LOS: 2 days

## 2012-08-01 NOTE — Progress Notes (Signed)
Pt chooses [facility] for [facility] services. Referral made to them.

## 2012-08-01 NOTE — Progress Notes (Signed)
ANTICOAGULATION CONSULT NOTE - Initial Consult  Pharmacy Consult for Warfarin Indication: DVT  Allergies  Allergen Reactions  . Metronidazole     REACTION: neuropathy   Patient Measurements: Height: 6' 0.05" (183 cm) Weight: 220 lb 14.4 oz (100.2 kg) IBW/kg (Calculated) : 77.71   Recent Labs  07/30/12 1728 07/30/12 1735 07/31/12 0528 08/01/12 0510  HGB 14.4  --  12.8* 12.6*  HCT 43.1  --  38.7* 38.4*  PLT 215  --  220 196  APTT  --  31  --   --   LABPROT  --  13.5  --  14.2  INR  --  1.04  --  1.11  CREATININE 1.84*  --  1.84* 1.94*   Estimated Creatinine Clearance: 68.3 ml/min (by C-G formula based on Cr of 1.94).  Assessment:  31 yom with h/o Hermansky-Pudlak syndrome (platelet storage pool disorder).  Lovenox was started 07/30/12 for acute DVT.   Pharmacy asked to start Coumadin 07/31/12 very cautiously given potential for bleeding platelet abnormalities.  Dr. Benjamine Mola has consulted with the patient's hematologist in [facility].  Low dose Vitamin K 100 mcg PO daily has been started per recommendations.  INR (1.11) is increased, but remains subtherapeutic.  CBC is stable; Hgb 12.6, Plt 196  Prednisone and broad-spectrum abx will likely increase sensitivity to warfarin.  Mercaptopurine can either increase or decrease warfarin effects.   Day #2 warfarin/Lovenox.  To meet CHEST guidelines, will need 5 day overlap of Lovenox/Coumadin and INR therapeutic x 24hrs before stopping Lovenox.   Goal of Therapy:  INR 2-3 (aim for low-end of the range) Anti-Xa level 0.6-1.2 units/ml 4hrs after LMWH dose given Monitor platelets by anticoagulation protocol: Yes   Plan:   Warfarin 5mg  PO today x1 dose  Continue Lovenox 100mg  SQ q12h  Daily PT/INR, CBC  Warfarin education. Ordered booklet and video.   Lynann Beaver PharmD, BCPS Pager [phone] 08/01/2012 7:15 AM

## 2012-08-01 NOTE — Progress Notes (Signed)
Patient educated on need to self administer Lovenox while here due to him going home with this medication.  Patient became very angry about having to give himself the shot.  Attempted to educate again on importance.  Patient did finally give self shot but remained very angry.  Stated that he had someone at home that would give them to him.  Informed that it was important for him to know the process incase there was no one there.  He stated in the past when he had to give himself the shot he found that he would not give the shots in regards that he knew it was going to hurt.  Patient again educated on the importance of the medication.

## 2012-08-02 DIAGNOSIS — J189 Pneumonia, unspecified organism: Secondary | ICD-10-CM

## 2012-08-02 LAB — PROTIME-INR
INR: 1.64 — ABNORMAL HIGH (ref 0.00–1.49)
Prothrombin Time: 18.9 seconds — ABNORMAL HIGH (ref 11.6–15.2)

## 2012-08-02 LAB — BASIC METABOLIC PANEL
Calcium: 8.3 mg/dL — ABNORMAL LOW (ref 8.4–10.5)
GFR calc Af Amer: 56 mL/min — ABNORMAL LOW (ref >90)
GFR calc non Af Amer: 48 mL/min — ABNORMAL LOW (ref >90)
Glucose, Bld: 115 mg/dL — ABNORMAL HIGH (ref 70–99)
Sodium: 141 mEq/L (ref 135–145)

## 2012-08-02 LAB — CBC
MCH: 26.4 pg (ref 26.0–34.0)
MCHC: 32.7 g/dL (ref 30.0–36.0)
Platelets: 185 10*3/uL (ref 150–400)
RDW: 16.9 % — ABNORMAL HIGH (ref 11.5–15.5)

## 2012-08-02 MED ORDER — BUPROPION HCL ER (SR) 150 MG PO TB12: 150.0000 mg | ORAL_TABLET | Freq: Every day | ORAL | Status: DC

## 2012-08-02 MED ORDER — BUSPIRONE HCL 10 MG PO TABS
10.0000 mg | ORAL_TABLET | Freq: Three times a day (TID) | ORAL | Status: DC
  Administered 2012-08-02: 10 mg via ORAL
  Filled 2012-08-02 (×3): qty 1

## 2012-08-02 MED ORDER — WARFARIN SODIUM 2.5 MG PO TABS: 2.5000 mg | ORAL_TABLET | Freq: Once | ORAL | Status: DC

## 2012-08-02 MED ORDER — WARFARIN SODIUM 4 MG PO TABS
4.0000 mg | ORAL_TABLET | Freq: Once | ORAL | Status: DC
  Filled 2012-08-02: qty 1

## 2012-08-02 MED ORDER — PREDNISONE 20 MG PO TABS: 40.0000 mg | ORAL_TABLET | Freq: Every day | ORAL | Status: DC

## 2012-08-02 MED ORDER — ENOXAPARIN SODIUM 100 MG/ML ~~LOC~~ SOLN: 1.0000 mg/kg | Freq: Two times a day (BID) | SUBCUTANEOUS | Status: DC

## 2012-08-02 MED ORDER — VITAMIN K 100 MCG PO TABS: 100.0000 ug | ORAL_TABLET | Freq: Every day | ORAL | Status: DC

## 2012-08-02 MED ORDER — BUPROPION HCL ER (SR) 150 MG PO TB12
150.0000 mg | ORAL_TABLET | Freq: Every day | ORAL | Status: DC
  Administered 2012-08-02: 150 mg via ORAL
  Filled 2012-08-02: qty 1

## 2012-08-02 MED ORDER — BUSPIRONE HCL 10 MG PO TABS: 10.0000 mg | ORAL_TABLET | Freq: Three times a day (TID) | ORAL | Status: DC

## 2012-08-02 MED ORDER — WARFARIN SODIUM 2.5 MG PO TABS
2.5000 mg | ORAL_TABLET | Freq: Once | ORAL | Status: DC
  Filled 2012-08-02: qty 1

## 2012-08-02 NOTE — Discharge Summary (Signed)
Physician Discharge Summary  [name] MWU:[id number] DOB: 1980-10-12 DOA: 07/30/2012  PCP: Sanda Linger, MD  Admit date: 07/30/2012 Discharge date: 08/02/2012  Time spent: 35 minutes  Recommendations for Outpatient Follow-up:  1. Pt/INR on Monday- stop lovenox once INR between 2-3 2. Length of treatment to be decided by Dr. Marcheta Grammes at [facility]  Discharge Diagnoses:  Principal Problem:   Acute respiratory failure with hypoxia Active Problems:   ALBINISM   ASTHMA, INTRINSIC NOS   COLITIS, UNIVERSAL ULCERATIVE   Hermansky-Pudlak syndrome   DVT (deep venous thrombosis)   CKD (chronic kidney disease) baseline Cr 1.8-1.9   Non-healing left groin open wound   Depression, recurrent   Discharge Condition: improved  Diet recommendation: regular  Filed Weights   07/30/12 1924  Weight: 100.2 kg (220 lb 14.4 oz)    History of present illness:  32 yo male with h/o Hermansky-Pudlak syndrome, ckd (baseline cr 1.8), asthma, diffuse inflammation of colon (niether really crohn's or UC per pt ) s/p colectomy in 2011, "skin crohn's but not really", fungal wound to his left groin area with ringworm per pt, comes in from pulm office with worsening sob and hypoxia despite tx with mult round of abx for bronchitis/pna. He has completed a course of augmentin and was started on doxy/cipro about 4 days ago. His prednisone has also been increased. He has also been on a antifungal agent for his groin wound which is improving for 3 weeks. He denies any fevers or cough. He denies any le pain, swelling or edema. He denies any hemoptysis. 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 No recent bleeding issues. No cp. No abd pain. He has also rectal involvment of his syndrome and denies any rectal bleeding. He gets much of his care at [facility]. Sent here for vq scan to r/o PE and it was low probability. U/s of ble shows a new dvt on prelim report. Pt states that he has h/o  bilateral dvt after he had his colon resection which he was treated with lovenox only. He has had several [facility] and thinks there may be some "old clot" there. Found to be hypoxic in ED which is reason for admission in addition to unclear new DVT.   Hospital Course:  1. DVT- acute- spoke with Hematology here who recommended lovenox bringe to coumadin- spoke with patient's hematologist in [facility] who also recommended lovenox bridge to coumadin--- she also recommended of vit K daily-- with follow up in her office to determine length of treatment (had DVT in 2012 after surgery as well); called PCP's office and arranged Pt/INR check with home health and results to PCP 2. Depression/anxiety- passive suicidal thoughts, declined inpt treatment so psych provided follow up- started wellbutrin and buspar- avoid benzos as an outpatient 3. Hermansky Pudlak syndrome 4. Asthma- wean off IV steroids to PO steroids-wean 5. ?PNA- treat with IV abx change to PO at d/c-treatment course finished on 2/22 6. CKD- stable 7. Leukocytosis- up and down due to steroids- monitor when off steroids   Procedures:  V/Q  Consultations:  Heme onc- [facility] via phone  Discharge Exam: Filed Vitals:   08/01/12 2200 08/01/12 2226 08/02/12 0600 08/02/12 0845  BP: 116/72  129/86   Pulse: 82  86   Temp: 98.1 F (36.7 C)  98.4 F (36.9 C)   TempSrc: Oral  Oral   Resp: 18  18   Height:  Weight:      SpO2: 95% 96% 96% 96%    General: A+Ox3, NAD Cardiovascular: rrr Respiratory: clear anterior  Discharge Instructions      Discharge Orders   Future Appointments Provider Department Dept Phone   08/14/2012 1:15 PM Etta Grandchild, MD [facility] [phone]   07/15/2013 1:30 PM Waymon Budge, MD [facility] [phone]   Future Orders Complete By Expires     Diet general  As directed     Discharge instructions  As directed     Comments:      PT/INR on Monday-  results to [facility] at [phone] Last dose of cipro/doxy on Saturday 2/22 [facility]- RN    Increase activity slowly  As directed         Medication List    TAKE these medications       albuterol 108 (90 BASE) MCG/ACT inhaler  Commonly known as:  PROAIR HFA  Inhale 2 puffs into the lungs 4 (four) times daily as needed for wheezing or shortness of breath.     baclofen 20 MG tablet  Commonly known as:  LIORESAL  Take 20 mg by mouth daily.     budesonide-formoterol 80-4.5 MCG/ACT inhaler  Commonly known as:  SYMBICORT  Inhale 2 puffs into the lungs 2 (two) times daily. Rinse mouth     buPROPion 150 MG 12 hr tablet  Commonly known as:  WELLBUTRIN SR  Take 1 tablet (150 mg total) by mouth daily.     busPIRone 10 MG tablet  Commonly known as:  BUSPAR  Take 1 tablet (10 mg total) by mouth 3 (three) times daily.     ciprofloxacin 500 MG tablet  Commonly known as:  CIPRO  Take 500 mg by mouth 2 (two) times daily. 10 day course of therapy started 07/26/12.     doxycycline 100 MG tablet  Commonly known as:  VIBRA-TABS  Take 100 mg by mouth 2 (two) times daily. 10 day course of therapy started 07/26/12.     enoxaparin 100 MG/ML injection  Commonly known as:  LOVENOX  Inject 1 mL (100 mg total) into the skin every 12 (twelve) hours.     esomeprazole 40 MG capsule  Commonly known as:  NEXIUM  Take 40 mg by mouth daily before breakfast.     fentaNYL 12 MCG/HR  Commonly known as:  DURAGESIC - dosed mcg/hr  Place 3 patches onto the skin every 3 (three) days.     Flutter Devi  Blow through 4 times per set,    3 sets per day when needed     gabapentin 300 MG capsule  Commonly known as:  NEURONTIN  Take 900 mg by mouth at bedtime.     mercaptopurine 50 MG tablet  Commonly known as:  PURINETHOL  Take 100 mg by mouth daily.     predniSONE 20 MG tablet  Commonly known as:  DELTASONE  Take 2 tablets (40 mg total) by mouth daily with breakfast.     terbinafine 250 MG  tablet  Commonly known as:  LAMISIL  Take 1 tablet by mouth daily.     vitamin k 100 MCG tablet  Take 1 tablet (100 mcg total) by mouth daily.     warfarin 2.5 MG tablet  Commonly known as:  COUMADIN  Take 1 tablet (2.5 mg total) by mouth one time only at 6 PM.       Follow-up Information   Follow  up with Sanda Linger, MD. (March 5th at 1:15)    Contact information:   [address] [place] [address] [phone]       Follow up with Psych Dr given to you by Child psychotherapist.      Follow up with Dr. Marcheta Grammes- 2-3 months.       The results of significant diagnostics from this hospitalization (including imaging, microbiology, ancillary and laboratory) are listed below for reference.    Significant Diagnostic Studies: Dg Chest 2 View  07/30/2012  *RADIOLOGY REPORT*  Clinical Data: Dyspnea and recent bronchitis  CHEST - 2 VIEW  Comparison: July 16, 2012  Findings: There is persistent central peribronchial thickening. There is patchy interstitial infiltrate in the left upper lobe. There is subtle increased opacity in the right base, suspicious for an additional area of interstitial infiltrate.  Elsewhere, the lungs are clear.  The heart size and pulmonary vascularity normal. No adenopathy.  IMPRESSION:  Patchy interstitial infiltrate left upper lobe and right base.  Persistent peribronchial thickening.  No adenopathy. No frank airspace consolidation.   Original Report Authenticated By: Bretta Bang, M.D.    Dg Chest 2 View  07/16/2012  *RADIOLOGY REPORT*  Clinical Data: Acute bronchitis with cough and shortness of breath  CHEST - 2 VIEW  Comparison:  December 23, 2009  Findings:  There is mild central peribronchial thickening, probably due to bronchitis.  There is no edema or consolidation.  The heart size and pulmonary vascularity are normal.  No bone lesions.  No adenopathy.  IMPRESSION: Central peribronchial thickening, probably indicative of bronchitis.  No  frank edema or consolidation.   Original Report Authenticated By: Bretta Bang, M.D.    Nm Pulmonary Perf And Vent  07/30/2012  *RADIOLOGY REPORT*  Clinical Data:  32 year old male with shortness of breath.  NUCLEAR MEDICINE VENTILATION - PERFUSION LUNG SCAN  Technique:  Wash-in, equilibrium, and wash-out phase ventilation images were obtained using Xe-133 gas.  Perfusion images were obtained in multiple projections after intravenous injection of Tc- 46m MAA.  Radiopharmaceuticals: 41.0 mCi technetium 37m labeled DTPA inhalation 6.0 mCi Tc-22m MAA.  Comparison:  Chest radiographs 1505 hours the same day.  Findings: Comparison chest radiograph demonstrates increased interstitial opacity with no pleural effusion or consolidation.  Ventilation imaging demonstrates artifact due to agent clumping, but fairly homogeneous ventilation distribution allowing for this. No obvious ventilation defect.  Perfusion imaging demonstrates superimposed artifact from the ventilation agent.  No definite perfusion defect.  IMPRESSION: Low probability of acute pulmonary embolism.   Original Report Authenticated By: Erskine Speed, M.D.     Microbiology: No results found for this or any previous visit (from the past 240 hour(s)).   Labs: Basic Metabolic Panel:  Recent Labs Lab 07/30/12 1728 07/31/12 0528 08/01/12 0510 08/02/12 0506  NA 137 136 141 141  K 3.6 4.1 4.0 4.0  CL 101 103 109 109  CO2 24 22 21 20   GLUCOSE 80 142* 118* 115*  BUN 31* 33* 32* 29*  CREATININE 1.84* 1.84* 1.94* 1.82*  CALCIUM 9.2 9.1 8.4 8.3*   Liver Function Tests:  Recent Labs Lab 08/01/12 0510  AST 13  ALT 17  ALKPHOS 77  BILITOT 0.2*  PROT 6.9  ALBUMIN 2.9*   No results found for this basename: LIPASE, AMYLASE,  in the last 168 hours No results found for this basename: AMMONIA,  in the last 168 hours CBC:  Recent Labs Lab 07/30/12 1728 07/31/12 0528 08/01/12  0510 08/02/12 0506  WBC 12.0* 10.5 15.6* 12.2*  NEUTROABS  9.4*  --   --   --   HGB 14.4 12.8* 12.6* 11.7*  HCT 43.1 38.7* 38.4* 35.8*  MCV 79.1 78.8 79.8 80.8  PLT 215 220 196 185   Cardiac Enzymes: No results found for this basename: CKTOTAL, CKMB, CKMBINDEX, TROPONINI,  in the last 168 hours BNP: BNP (last 3 results)  Recent Labs  07/30/12 1510  PROBNP 83.0   CBG: No results found for this basename: GLUCAP,  in the last 168 hours     Signed:  Benjamine Mola, Terri Malerba  [facility] 08/02/2012, 1:25 PM

## 2012-08-02 NOTE — Progress Notes (Addendum)
ANTIBIOTIC CONSULT NOTE - FOLLOW UP  Pharmacy Consult for Vancomycin, Zosyn Indication: rule out pneumonia  Allergies  Allergen Reactions  . Metronidazole     REACTION: neuropathy    Patient Measurements: Height: 6' 0.05" (183 cm) Weight: 220 lb 14.4 oz (100.2 kg) IBW/kg (Calculated) : 77.71  Vital Signs: Temp: 98.4 F (36.9 C) (02/21 0600) Temp src: Oral (02/21 0600) BP: 129/86 mmHg (02/21 0600) Pulse Rate: 86 (02/21 0600) Intake/Output from previous day: 02/20 0701 - 02/21 0700 In: 1640 [P.O.:1040; IV Piggyback:600] Out: -   Labs:  Recent Labs  07/31/12 0528 08/01/12 0510 08/02/12 0506  WBC 10.5 15.6* 12.2*  HGB 12.8* 12.6* 11.7*  PLT 220 196 185  CREATININE 1.84* 1.94* 1.82*   Estimated Creatinine Clearance: 72.8 ml/min (by C-G formula based on Cr of 1.82). No results found for this basename: VANCOTROUGH, Leodis Binet, VANCORANDOM, GENTTROUGH, GENTPEAK, GENTRANDOM, TOBRATROUGH, TOBRAPEAK, TOBRARND, AMIKACINPEAK, AMIKACINTROU, AMIKACIN,  in the last 72 hours    Anti-infectives   Start     Dose/Rate Route Frequency Ordered Stop   07/31/12 1000  terbinafine (LAMISIL) tablet 250 mg     250 mg Oral Daily 07/30/12 2253     07/31/12 0800  vancomycin (VANCOCIN) 1,250 mg in sodium chloride 0.9 % 250 mL IVPB     1,250 mg 166.7 mL/hr over 90 Minutes Intravenous Every 12 hours 07/30/12 2336     07/31/12 0400  piperacillin-tazobactam (ZOSYN) IVPB 3.375 g     3.375 g 12.5 mL/hr over 240 Minutes Intravenous Every 8 hours 07/30/12 2319     07/30/12 2000  vancomycin (VANCOCIN) IVPB 1000 mg/200 mL premix     1,000 mg 200 mL/hr over 60 Minutes Intravenous  Once 07/30/12 1947 07/30/12 2231   07/30/12 2000  piperacillin-tazobactam (ZOSYN) IVPB 3.375 g     3.375 g 100 mL/hr over 30 Minutes Intravenous  Once 07/30/12 1947 07/30/12 2131      Assessment:  31 yom admit with suspected pneumonia.  Day #4 Vancomycin and Zosyn  SCr remains elevated at 1.82, CrCl ~ 60 N  Noted  possible plan for d/c on PO antibiotics, so will not get vanc trough at this time.  Goal of Therapy:  Vancomycin trough level 15-20 mcg/ml Appropriate abx dosing, eradication of infection.  Plan:   Continue Zosyn 3.375g IV Q8H infused over 4hrs.  Continue Vancomycin 1250 IV q12h.  Measure Vanc trough at steady state.  Follow up renal fxn and culture results.  Lynann Beaver PharmD, BCPS Pager [phone] 08/02/2012 9:06 AM   Addendum:  Warfarin education completed 08/02/12.

## 2012-08-02 NOTE — Consult Note (Signed)
Patient Identification:  [name] Date of Evaluation:  08/02/2012 Reason for Consult:  Depression, Pneumonia  Referring Provider:  Dr. Benjamine Mola  History of Present IllnessThis 32 yo male with Hermansky-Pudlak syndrome is admitted for bronchitis and pneumonia. He has been taking antibiotic and due to other complicating symptoms is treated IV. The long-range complication for this pt is development of interstitial fibrosis   Past Psychiatric History: This pt. struggles with a very high drive for self-actualization.  He has ambition, ability and creativity beyond limitations his illnesses allow.  He is trying to resist discouragement and depression related to symptoms of his genetic condition and comorbid illnesses.   He has not been suicidal actively but has done an extensive web search of methods to commit suicide.   Past Medical History:     Past Medical History  Diagnosis Date  . Hermansky-Pudlak syndrome     colitis, albinism,fibrosis, followed by [facility] renal  . Pneumonia, organism unspecified   . Pain in limb   . Other specified cardiac dysrhythmias   . Allergic rhinitis, cause unspecified   . Allergy, unspecified not elsewhere classified   . Eustachian tube dysfunction   . Generalized anxiety disorder   . Other acne   . Intrinsic asthma, unspecified   . Disorder of bone and cartilage, unspecified   . Other disturbances of aromatic amino-acid metabolism   . Unspecified visual loss   . Universal ulcerative (chronic) colitis   . Iron deficiency anemia, unspecified   . Other and unspecified noninfectious gastroenteritis and colitis   . S/p total colectomy 2012       Past Surgical History  Procedure Laterality Date  . Nih w/u pulm w/u mild asthma ned fibrosis  01-2000  . [facility] admit for observation  09-01-2003  . Head ct w/o nml mod l mastoid effusion  06-09-08  . Sllep study mild periodic limb movements  08-25-2004  . Colonoscopy complete unch coloitis start remicade  11-1999   . Colonoscopy colitis  09-1999  . Pft's (dr young) no fibrosis  12-09-2001    Allergies:  Allergies  Allergen Reactions  . Metronidazole     REACTION: neuropathy    Current Medications:  Prior to Admission medications   Medication Sig Start Date End Date Taking? Authorizing Provider  albuterol (PROAIR HFA) 108 (90 BASE) MCG/ACT inhaler Inhale 2 puffs into the lungs 4 (four) times daily as needed for wheezing or shortness of breath. 11/30/11 11/29/12 Yes Clinton D Young, MD  baclofen (LIORESAL) 20 MG tablet Take 20 mg by mouth daily.   Yes Historical Provider, MD  budesonide-formoterol (SYMBICORT) 80-4.5 MCG/ACT inhaler Inhale 2 puffs into the lungs 2 (two) times daily. Rinse mouth 11/30/11 11/29/12 Yes Clinton D Young, MD  ciprofloxacin (CIPRO) 500 MG tablet Take 500 mg by mouth 2 (two) times daily. 10 day course of therapy started 07/26/12.   Yes Historical Provider, MD  doxycycline (VIBRA-TABS) 100 MG tablet Take 100 mg by mouth 2 (two) times daily. 10 day course of therapy started 07/26/12.   Yes Historical Provider, MD  esomeprazole (NEXIUM) 40 MG capsule Take 40 mg by mouth daily before breakfast.     Yes Historical Provider, MD  fentaNYL (DURAGESIC - DOSED MCG/HR) 12 MCG/HR Place 3 patches onto the skin every 3 (three) days.   Yes Historical Provider, MD  gabapentin (NEURONTIN) 300 MG capsule Take 900 mg by mouth at bedtime.    Yes Historical Provider, MD  mercaptopurine (PURINETHOL) 50 MG tablet Take 100 mg  by mouth daily.  06/25/12  Yes Historical Provider, MD  terbinafine (LAMISIL) 250 MG tablet Take 1 tablet by mouth daily. 07/05/12  Yes Historical Provider, MD  buPROPion (WELLBUTRIN SR) 150 MG 12 hr tablet Take 1 tablet (150 mg total) by mouth daily. 08/02/12   Joseph Art, DO  busPIRone (BUSPAR) 10 MG tablet Take 1 tablet (10 mg total) by mouth 3 (three) times daily. 08/02/12   Joseph Art, DO  enoxaparin (LOVENOX) 100 MG/ML injection Inject 1 mL (100 mg total) into the skin every  12 (twelve) hours. 08/02/12   Joseph Art, DO  predniSONE (DELTASONE) 20 MG tablet Take 2 tablets (40 mg total) by mouth daily with breakfast. 08/02/12   Joseph Art, DO  Respiratory Therapy Supplies (FLUTTER) DEVI Blow through 4 times per set,    3 sets per day when needed 11/30/11 11/29/12  Waymon Budge, MD  vitamin k 100 MCG tablet Take 1 tablet (100 mcg total) by mouth daily. 08/02/12   Joseph Art, DO  warfarin (COUMADIN) 2.5 MG tablet Take 1 tablet (2.5 mg total) by mouth one time only at 6 PM. 08/02/12   Joseph Art, DO    Social History:    reports that he has never smoked. He has never used smokeless tobacco. He reports that he does not drink alcohol or use illicit drugs.   Family History:    Family History  Problem Relation Age of Onset  . Asthma      grandmother-second hand smoke  . COPD      grandmother  . Asthma Father   . Other Cousin     HPS-1    Mental Status Examination/Evaluation: Objective:  Appearance: albinism,   Eye Contact::  Fair  Speech:  Clear and Coherent and Normal Rate  Volume:  Decreased  Mood:  stressed  Affect:  Congruent and Depressed  Thought Process:  Coherent, Goal Directed, Intact, Logical and apprehensive of tasks facing him in near future  Orientation:  Full (Time, Place, and Person)  Thought Content:  Rumination and apprehensive of future stress asking for benzodiazepines  Suicidal Thoughts:  No  Homicidal Thoughts:  No  Judgement:  Fair  Insight:  values and reality are in conflict and this obscures reality of his situation   DIAGNOSIS:   AXIS I  Depression with passive suicidal ideation dur to another medical/genetic condition  AXIS II  Deferred  AXIS III See medical notes.  AXIS IV economic problems, housing problems, other psychosocial or environmental problems, problems related to social environment, problems with primary support group and Pt's medical problems frustrate his ambitions  AXIS V 41-50 serious symptoms    Assessment/Plan:  Discussed with Dr. Benjamine Mola, Psych CSW Pt is realizing his goals of starting a business will be constantly 'put on the backburnner' for all the manifestations of his genetic condition Hermansky-Pudlak syndrome.  He is not suicidal.  He is provided with computer-based reference to anxiety-reduction sites; referral to Reiki therapy for energy work; and outpatient Mental Health resources, Dr. Donell Beers, MD. He is asking for Ativan.  This is discouraged due to its high propensity of addiction, dependency and association with memory loss when used chronically.  He is offered medication to improve mood and focus and reduce anxiety.       The advantage in his situation (with therapy also) is to focus on daily medication 10-15 minutes daily.  The benefits become obvious with practice as in physical exercising.  RECOMMENDATION:  1. Pt has capacity and declines to go to [facility]  With passive suicidal thoughts, he is encouraged to CALL 911 or go to the nearest ED. 2.  Pt is offered Wellbutrin XR 150 mg in am with meal daily.  3.  He is also offered Buspar, buspirone,  10 mg 3 times daily.  4.  Psych CSW plans to provide location and name of psychiatrist. 5.  No further psychiatric needs.  MD Psychiatrist signs off.  Freedom Lopezperez MD 08/02/2012 1:09 PM

## 2012-08-02 NOTE — Progress Notes (Addendum)
ANTICOAGULATION CONSULT NOTE - Initial Consult  Pharmacy Consult for Warfarin Indication: DVT  Allergies  Allergen Reactions  . Metronidazole     REACTION: neuropathy   Patient Measurements: Height: 6' 0.05" (183 cm) Weight: 220 lb 14.4 oz (100.2 kg) IBW/kg (Calculated) : 77.71   Recent Labs  07/30/12 1735 07/31/12 0528 08/01/12 0510 08/02/12 0506  HGB  --  12.8* 12.6* 11.7*  HCT  --  38.7* 38.4* 35.8*  PLT  --  220 196 185  APTT 31  --   --   --   LABPROT 13.5  --  14.2 18.9*  INR 1.04  --  1.11 1.64*  CREATININE  --  1.84* 1.94* 1.82*   Estimated Creatinine Clearance: 72.8 ml/min (by C-G formula based on Cr of 1.82).  Assessment:  31 yom with h/o Hermansky-Pudlak syndrome (platelet storage pool disorder).  Lovenox was started 07/30/12 for acute DVT.   Pharmacy asked to start Coumadin 07/31/12 very cautiously given potential for bleeding platelet abnormalities.  Dr. Benjamine Mola has consulted with the patient's hematologist in [facility]  Low dose Vitamin K 100 mcg PO daily has been started per recommendations.  INR (1.64) is increased, but remains subtherapeutic.  CBC is stable; Hgb 11.7, Plt 185  Prednisone and broad-spectrum abx will likely increase sensitivity to warfarin.  Mercaptopurine can either increase or decrease warfarin effects.   Day #3 warfarin/Lovenox.  To meet CHEST guidelines, will need 5 day overlap of Lovenox/Coumadin and INR therapeutic x 24hrs before stopping Lovenox.   Noted plans for discharge on PO abx - will need LOW dose warfarin d/t drug interaction.  Goal of Therapy:  INR 2-3 (aim for low-end of the range) Anti-Xa level 0.6-1.2 units/ml 4hrs after LMWH dose given Monitor platelets by anticoagulation protocol: Yes   Plan:   Warfarin 2.5mg  PO today x1 dose  If discharged, would recommend continuing warfarin 2.5mg  PO daily along with Lovenox bridge and close f/u of INR by Monday, 2/24.  Continue Lovenox 100mg  SQ q12h  Daily PT/INR,  CBC  Warfarin education. Ordered booklet and video.   Lynann Beaver PharmD, BCPS Pager [phone] 08/02/2012 8:55 AM

## 2012-08-02 NOTE — Progress Notes (Signed)
Patient discharged to home.  Reviewed discharge instructions with patient.  No further questions at this time. Iv in right forearm removed.  All belongings with patient.  Patient escorted off the unit by patient's father, patient refused a wheelchair. Patient discharged.

## 2012-08-02 NOTE — Progress Notes (Addendum)
Psych MD and CSW met with Pt and provided him with outpt mental health information, including reiki massage.  Pt was appreciative of the information and stated his intent to explore these options.  When asked about current SI, Pt stated, "I'm no more suicidal today than I am any other day.  No, I'm not suicidal.  I have no plans to harm myself."  Pt asked psych MD for medication to address the recent increase in his anxiety and he specifically asked for a script for Ativan.  Psych MD discussed with Pt why this med was not appropriate for him, as it's short-lived and addictive.  Additionally, psych MD explained to Pt that taking a med such as this robs Pt of the opportunity to rely on himself for calming strategies.  Psych MD discussed with Pt other medications that are effective for anxiety and depression and Pt was agreeable.  Psych MD notified MD of her medication recommendations.  No further psych CSW needs identified.  CSW to sign off.  Providence Crosby, LCSWA Clinical Social Work [phone]

## 2012-08-05 ENCOUNTER — Ambulatory Visit (INDEPENDENT_AMBULATORY_CARE_PROVIDER_SITE_OTHER): Payer: 59 | Admitting: General Practice

## 2012-08-05 ENCOUNTER — Telehealth: Payer: Self-pay | Admitting: General Practice

## 2012-08-05 ENCOUNTER — Telehealth: Payer: Self-pay | Admitting: *Deleted

## 2012-08-05 DIAGNOSIS — E70331 Hermansky-Pudlak syndrome: Secondary | ICD-10-CM

## 2012-08-05 DIAGNOSIS — I82409 Acute embolism and thrombosis of unspecified deep veins of unspecified lower extremity: Secondary | ICD-10-CM

## 2012-08-05 DIAGNOSIS — I82401 Acute embolism and thrombosis of unspecified deep veins of right lower extremity: Secondary | ICD-10-CM

## 2012-08-05 DIAGNOSIS — Z7901 Long term (current) use of anticoagulants: Secondary | ICD-10-CM | POA: Insufficient documentation

## 2012-08-05 LAB — POCT INR: INR: 2.4

## 2012-08-05 NOTE — Telephone Encounter (Signed)
Received call from nurse [facility], she tried to call the [facility] office regarding this patient but there was no coumadin nurse there today, he was d/c on coumadin and lovenox, pt states he sees Dr Kerry Dory at [facility] for blood disorder and is weary regarding taking these anticoagulants, HX:  "Hermansky-Pudlak syndrome DVT (deep venous thrombosis)" noted on d/c note. Patient has not taken his Lovenox today but is taking coumadin, patient states he has called Dr Marcheta Grammes and will not take any more coumadin until he speak with MD. [facility] wants to check INR tomorrow after patient has spoken with MD today. Will route to MD and Bailey Mech.  Thanks, Addison Lank, RN

## 2012-08-06 ENCOUNTER — Telehealth: Payer: Self-pay | Admitting: *Deleted

## 2012-08-06 ENCOUNTER — Telehealth: Payer: Self-pay | Admitting: Internal Medicine

## 2012-08-06 DIAGNOSIS — I82403 Acute embolism and thrombosis of unspecified deep veins of lower extremity, bilateral: Secondary | ICD-10-CM

## 2012-08-06 NOTE — Telephone Encounter (Signed)
Pt is requesting a referral to Dr. Myrle Sheng at the cancer center due to bruising from the coumadin and lovenox.  He has a bleeding disorder.  He has a post hosp appt March 5.

## 2012-08-06 NOTE — Telephone Encounter (Signed)
Message from pt requesting appt with Dr. Truett Perna. Stated he has been put on Coumadin and isn't sure if he should be taking it. Having bruising.

## 2012-08-06 NOTE — Telephone Encounter (Signed)
done

## 2012-08-06 NOTE — Telephone Encounter (Signed)
Pt is aware that [facility] will call when appt is set up.

## 2012-08-06 NOTE — Telephone Encounter (Signed)
Per Dr. Truett Perna: more appropriate for pt to see MD at [facility] as his case is very complicated and they have been following him. He voiced understanding. Stated he would call their office.

## 2012-08-09 ENCOUNTER — Telehealth: Payer: Self-pay | Admitting: Internal Medicine

## 2012-08-09 ENCOUNTER — Telehealth: Payer: Self-pay | Admitting: *Deleted

## 2012-08-09 NOTE — Telephone Encounter (Signed)
Per Judeth Cornfield Dr. Danielle Dess nurse the patient needs to be referred back to [facility] Dr. Erma Heritage for his DVT, you can call [phone] and ask for [facility] with any further questions

## 2012-08-09 NOTE — Telephone Encounter (Signed)
Received referral from Dr. Yetta Barre' office for pt to see Dr Truett Perna for a DVT.  Per Dr Truett Perna, pt needs to be referred to MD at [facility] Dr Erma Heritage.  Called and left msg with Dr Yetta Barre' RN with this information.  SLJ

## 2012-08-13 ENCOUNTER — Ambulatory Visit (INDEPENDENT_AMBULATORY_CARE_PROVIDER_SITE_OTHER): Payer: 59 | Admitting: General Practice

## 2012-08-13 DIAGNOSIS — E70331 Hermansky-Pudlak syndrome: Secondary | ICD-10-CM

## 2012-08-13 DIAGNOSIS — E7089 Other disorders of aromatic amino-acid metabolism: Secondary | ICD-10-CM

## 2012-08-13 DIAGNOSIS — Z7901 Long term (current) use of anticoagulants: Secondary | ICD-10-CM

## 2012-08-13 LAB — POCT INR: INR: 2.3

## 2012-08-14 ENCOUNTER — Encounter: Payer: Self-pay | Admitting: Internal Medicine

## 2012-08-14 ENCOUNTER — Ambulatory Visit (INDEPENDENT_AMBULATORY_CARE_PROVIDER_SITE_OTHER): Payer: 59 | Admitting: Internal Medicine

## 2012-08-14 ENCOUNTER — Ambulatory Visit (INDEPENDENT_AMBULATORY_CARE_PROVIDER_SITE_OTHER)
Admission: RE | Admit: 2012-08-14 | Discharge: 2012-08-14 | Disposition: A | Discharge status: Home / Self Care | Payer: 59 | Source: Ambulatory Visit | Attending: Internal Medicine | Admitting: Internal Medicine

## 2012-08-14 ENCOUNTER — Other Ambulatory Visit (INDEPENDENT_AMBULATORY_CARE_PROVIDER_SITE_OTHER): Payer: 59

## 2012-08-14 VITALS — BP 106/68 | HR 128 | Temp 100.6°F | Resp 16 | Wt 234.0 lb

## 2012-08-14 DIAGNOSIS — R609 Edema, unspecified: Secondary | ICD-10-CM

## 2012-08-14 DIAGNOSIS — N182 Chronic kidney disease, stage 2 (mild): Secondary | ICD-10-CM

## 2012-08-14 DIAGNOSIS — R05 Cough: Secondary | ICD-10-CM

## 2012-08-14 DIAGNOSIS — I82409 Acute embolism and thrombosis of unspecified deep veins of unspecified lower extremity: Secondary | ICD-10-CM

## 2012-08-14 DIAGNOSIS — J189 Pneumonia, unspecified organism: Secondary | ICD-10-CM | POA: Insufficient documentation

## 2012-08-14 DIAGNOSIS — N189 Chronic kidney disease, unspecified: Secondary | ICD-10-CM

## 2012-08-14 DIAGNOSIS — D509 Iron deficiency anemia, unspecified: Secondary | ICD-10-CM

## 2012-08-14 DIAGNOSIS — D649 Anemia, unspecified: Secondary | ICD-10-CM

## 2012-08-14 DIAGNOSIS — I82402 Acute embolism and thrombosis of unspecified deep veins of left lower extremity: Secondary | ICD-10-CM

## 2012-08-14 DIAGNOSIS — J45909 Unspecified asthma, uncomplicated: Secondary | ICD-10-CM

## 2012-08-14 LAB — COMPREHENSIVE METABOLIC PANEL
Albumin: 3.4 g/dL — ABNORMAL LOW (ref 3.5–5.2)
Alkaline Phosphatase: 106 U/L (ref 39–117)
Glucose, Bld: 74 mg/dL (ref 70–99)
Potassium: 3.9 mEq/L (ref 3.5–5.1)
Sodium: 138 mEq/L (ref 135–145)
Total Protein: 7.5 g/dL (ref 6.0–8.3)

## 2012-08-14 LAB — BRAIN NATRIURETIC PEPTIDE: Pro B Natriuretic peptide (BNP): 23 pg/mL (ref 0.0–100.0)

## 2012-08-14 LAB — IBC PANEL: Iron: 32 ug/dL — ABNORMAL LOW (ref 42–165)

## 2012-08-14 LAB — FOLATE: Folate: 12.5 ng/mL (ref >5.9)

## 2012-08-14 MED ORDER — AMOXICILLIN-POT CLAVULANATE 875-125 MG PO TABS: 1.0000 | ORAL_TABLET | Freq: Two times a day (BID) | ORAL | Status: DC

## 2012-08-14 NOTE — Progress Notes (Signed)
Subjective:    Patient ID: [name], male    DOB: 08-11-1980, 32 y.o.   MRN: [id number]  Cough This is a recurrent problem. The current episode started 1 to 4 weeks ago. The problem has been gradually worsening. The problem occurs every few hours. The cough is productive of purulent sputum. Associated symptoms include a fever and shortness of breath. Pertinent negatives include no chest pain, chills, ear congestion, ear pain, headaches, heartburn, hemoptysis, myalgias, nasal congestion, postnasal drip, rash, rhinorrhea, sore throat, sweats, weight loss or wheezing. Nothing aggravates the symptoms. He has tried steroid inhaler, a beta-agonist inhaler and oral steroids (cipro and doxycycline) for the symptoms. The treatment provided mild relief. His past medical history is significant for asthma and pneumonia. There is no history of COPD or environmental allergies.      Review of Systems  Constitutional: Positive for fever. Negative for chills, weight loss, diaphoresis, activity change, appetite change, fatigue and unexpected weight change.  HENT: Negative for ear pain, nosebleeds, congestion, sore throat, facial swelling, rhinorrhea, sneezing, trouble swallowing, voice change, postnasal drip and sinus pressure.   Eyes: Negative.   Respiratory: Positive for cough and shortness of breath. Negative for apnea, hemoptysis, choking, chest tightness, wheezing and stridor.   Cardiovascular: Positive for leg swelling. Negative for chest pain and palpitations.  Gastrointestinal: Negative for heartburn, nausea, vomiting, abdominal pain, diarrhea, constipation and blood in stool.  Endocrine: Negative.   Genitourinary: Negative.   Musculoskeletal: Negative for myalgias, back pain, joint swelling, arthralgias and gait problem.  Skin: Negative for color change, pallor, rash and wound.  Allergic/Immunologic: Positive for immunocompromised state. Negative for environmental allergies and food allergies.   Neurological: Negative for dizziness, weakness, light-headedness, numbness and headaches.  Hematological: Negative for adenopathy. Does not bruise/bleed easily.  Psychiatric/Behavioral: Negative.        Objective:   Physical Exam  Vitals reviewed. Constitutional: He is oriented to person, place, and time. He appears well-developed and well-nourished.  Non-toxic appearance. He does not have a sickly appearance. He does not appear ill. No distress.  HENT:  Head: Normocephalic and atraumatic. No trismus in the jaw.  Mouth/Throat: Oropharynx is clear and moist and mucous membranes are normal. Mucous membranes are not pale, not dry and not cyanotic. No oral lesions. Normal dentition. No edematous. No oropharyngeal exudate, posterior oropharyngeal edema, posterior oropharyngeal erythema or tonsillar abscesses.  Eyes: Conjunctivae are normal. Right eye exhibits no discharge. Left eye exhibits no discharge. No scleral icterus.  Neck: Normal range of motion. Neck supple. No JVD present. No tracheal deviation present. No thyromegaly present.  Cardiovascular: Normal rate, regular rhythm, normal heart sounds and intact distal pulses.  Exam reveals no gallop and no friction rub.   No murmur heard. Pulmonary/Chest: Effort normal and breath sounds normal. No accessory muscle usage or stridor. Not tachypneic. No respiratory distress. He has no decreased breath sounds. He has no wheezes. He has no rhonchi. He has no rales. He exhibits no tenderness.  Abdominal: Soft. Bowel sounds are normal. He exhibits no distension and no mass. There is no tenderness. There is no rebound and no guarding.  Musculoskeletal: Normal range of motion. He exhibits edema (2+ edema in BLE). He exhibits no tenderness.  Lymphadenopathy:    He has no cervical adenopathy.  Neurological: He is oriented to person, place, and time.  Skin: Skin is warm and dry. No rash noted. He is not diaphoretic. No erythema. No pallor.  Psychiatric: He  has a normal mood and affect.  His behavior is normal. Judgment and thought content normal.      Lab Results  Component Value Date   WBC 12.2* 08/02/2012   HGB 11.7* 08/02/2012   HCT 35.8* 08/02/2012   PLT 185 08/02/2012   GLUCOSE 115* 08/02/2012   ALT 17 08/01/2012   AST 13 08/01/2012   NA 141 08/02/2012   K 4.0 08/02/2012   CL 109 08/02/2012   CREATININE 1.82* 08/02/2012   BUN 29* 08/02/2012   CO2 20 08/02/2012   TSH 0.964 06/21/2007   INR 2.3 08/13/2012      Assessment & Plan:

## 2012-08-14 NOTE — Patient Instructions (Signed)

## 2012-08-14 NOTE — Telephone Encounter (Signed)
Opened in error

## 2012-08-15 ENCOUNTER — Encounter: Payer: Self-pay | Admitting: Internal Medicine

## 2012-08-15 DIAGNOSIS — D509 Iron deficiency anemia, unspecified: Secondary | ICD-10-CM | POA: Insufficient documentation

## 2012-08-15 DIAGNOSIS — J45909 Unspecified asthma, uncomplicated: Secondary | ICD-10-CM

## 2012-08-15 DIAGNOSIS — N189 Chronic kidney disease, unspecified: Secondary | ICD-10-CM

## 2012-08-15 DIAGNOSIS — N182 Chronic kidney disease, stage 2 (mild): Secondary | ICD-10-CM | POA: Insufficient documentation

## 2012-08-15 DIAGNOSIS — B356 Tinea cruris: Secondary | ICD-10-CM

## 2012-08-15 DIAGNOSIS — I82409 Acute embolism and thrombosis of unspecified deep veins of unspecified lower extremity: Secondary | ICD-10-CM

## 2012-08-15 MED ORDER — FERRALET 90 90-1 MG PO TABS: 1.0000 | ORAL_TABLET | Freq: Every day | ORAL | Status: DC

## 2012-08-15 NOTE — Assessment & Plan Note (Signed)
Will check his CBC and his vitamin levels today

## 2012-08-15 NOTE — Assessment & Plan Note (Signed)
On CXR he has areas of inflammation I have asked him to f/up with his pulmonologist

## 2012-08-15 NOTE — Assessment & Plan Note (Signed)
Slight worsening noted today

## 2012-08-15 NOTE — Assessment & Plan Note (Signed)
I have asked him to start ferralet

## 2012-08-15 NOTE — Assessment & Plan Note (Signed)
I will check a BNP to see if he is in heart failure, will also recheck his renal function He has a low albumin and renal insufficiency so this is the most likely cause for his edema

## 2012-08-15 NOTE — Assessment & Plan Note (Signed)
Will continue coumadin for now

## 2012-08-15 NOTE — Assessment & Plan Note (Signed)
He has a purulent cough today so I have asked him to start augmentin

## 2012-08-19 ENCOUNTER — Telehealth: Payer: Self-pay | Admitting: Internal Medicine

## 2012-08-19 NOTE — Telephone Encounter (Signed)
Spoke with patient, patient is currently out of town. appt for tomorrow 08/20/12 has been rescheduled 08/29/12 at 1100. Patient aware to arrive at 1045am

## 2012-08-19 NOTE — Telephone Encounter (Signed)
lmomtcb for pt  [name] with Dr. Barnett Applebaum office as requested above.  Advised I have attempted to call pt's father but had to lmtcb.  We have scheduled pt to see Dr. Maple Hudson on tomorrow, March 11 at 9:45 am.  Stanton Kidney will try to contact pt's father to inform him of this appt.    In the meantime, I have attempted to call pt's father back.  Lmomtcb.  Appt was scheduled with [facility] for tomorrow, March 11 at 9:45am -- will need to inform him of this.  If this doesn't work, appt will need to be cancelled and we will need to reschedule pt.

## 2012-08-20 ENCOUNTER — Ambulatory Visit: Payer: 59 | Admitting: Internal Medicine

## 2012-08-21 ENCOUNTER — Ambulatory Visit (INDEPENDENT_AMBULATORY_CARE_PROVIDER_SITE_OTHER): Payer: 59 | Admitting: General Practice

## 2012-08-21 DIAGNOSIS — E70331 Hermansky-Pudlak syndrome: Secondary | ICD-10-CM

## 2012-08-21 DIAGNOSIS — Z7901 Long term (current) use of anticoagulants: Secondary | ICD-10-CM

## 2012-08-21 LAB — POCT INR: INR: 2.3

## 2012-08-28 ENCOUNTER — Ambulatory Visit (INDEPENDENT_AMBULATORY_CARE_PROVIDER_SITE_OTHER): Payer: 59 | Admitting: General Practice

## 2012-08-28 DIAGNOSIS — E70331 Hermansky-Pudlak syndrome: Secondary | ICD-10-CM

## 2012-08-28 DIAGNOSIS — Z7901 Long term (current) use of anticoagulants: Secondary | ICD-10-CM

## 2012-08-28 LAB — POCT INR: INR: 1.2

## 2012-08-29 ENCOUNTER — Ambulatory Visit (INDEPENDENT_AMBULATORY_CARE_PROVIDER_SITE_OTHER)
Admission: RE | Admit: 2012-08-29 | Discharge: 2012-08-29 | Disposition: A | Discharge status: Home / Self Care | Payer: 59 | Source: Ambulatory Visit | Attending: Internal Medicine | Admitting: Internal Medicine

## 2012-08-29 ENCOUNTER — Ambulatory Visit (INDEPENDENT_AMBULATORY_CARE_PROVIDER_SITE_OTHER): Payer: 59 | Admitting: Internal Medicine

## 2012-08-29 ENCOUNTER — Encounter: Payer: Self-pay | Admitting: Internal Medicine

## 2012-08-29 ENCOUNTER — Telehealth: Payer: Self-pay | Admitting: Internal Medicine

## 2012-08-29 VITALS — BP 118/70 | HR 93 | Ht 72.0 in | Wt 226.6 lb

## 2012-08-29 DIAGNOSIS — J189 Pneumonia, unspecified organism: Secondary | ICD-10-CM

## 2012-08-29 DIAGNOSIS — I82409 Acute embolism and thrombosis of unspecified deep veins of unspecified lower extremity: Secondary | ICD-10-CM

## 2012-08-29 DIAGNOSIS — I82402 Acute embolism and thrombosis of unspecified deep veins of left lower extremity: Secondary | ICD-10-CM

## 2012-08-29 MED ORDER — BUPROPION HCL ER (SR) 150 MG PO TB12: 150.0000 mg | ORAL_TABLET | Freq: Every day | ORAL | Status: DC

## 2012-08-29 MED ORDER — BUSPIRONE HCL 10 MG PO TABS: 10.0000 mg | ORAL_TABLET | Freq: Three times a day (TID) | ORAL | Status: DC

## 2012-08-29 NOTE — Patient Instructions (Addendum)
Order- CXR   Dx pneumonia, question pulmonary fibrosis  The decision about how long to continue warfarin will be made through the hematology doctor at [facility]

## 2012-08-29 NOTE — Telephone Encounter (Signed)
Pt requests refills for Bupropion SR 150mg  and Buspirone HCL 10mg .  Pt stated these meds were given to him in the hospital and asked if Dr. Yetta Barre can provide a refill until his appt with psych on April 3.  Please call pt if this is okay.

## 2012-08-29 NOTE — Progress Notes (Signed)
Subjective:    Patient ID: [name], male    DOB: Nov 04, 1980, 32 y.o.   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   Here with friend [name] Last here August 09, 2010 Currently says breathing is pretty good. Had temp 100 degrees x 1 week, sore throat, tender left cervical node. Had dieted and lost 40 lbs since off prednisone x 6 months. Now on 6-mercaptopurine (which drops WBC to 3000) and Remicade. These drugs are managed by Dr Sherrill/ Heme Onc and Dr Schooler/ GI. Denies wheeze, phlegm. Took oxycodone this AM for pains. For the past year has had some dry cough. Concerned about this as an early fibrosis marker. Wants PFT every 6 months. He plans to go( unscheduled so far) for CXR and BAL at [facility] so he asks not to do a cxr here so as to minimize radiation exposure.    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   Since last here he had a total colectomy in 2012. Had ER visit at [facility] 03/24/2011 and CT scan suggested "something in lung" which he wanted to review. Treated with Levaquin. Has also had ER visit for kidney stone. Has stage III renal disease with 20% function-not known why. CXR 11/26/11- he reports "much clearer". Images were available on discs he brought from [facility]. We reviewed the images together. It looks as if he was dealing with a pneumonia in October with some residual density-pneumonia versus atelectasis-on the recent chest x-ray. He has minimal cough and no acute chest symptoms. Some of his interstitial markings may have been a little prominent but there was no dramatic interstitial fibrosis.  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 ACUTE VISIT: about 1 week or so ago started having cough, congestion, and had stomach flu over the weekend; noticed increase in mucus-started abx yesterday and has reduced the mucus amount. Major concern was breathing issues-felt unable to get breath and go any further. Noiced wheezing as well.We had called in augmentin yesterday to start because of his concern he was getting pneumonia. Describes 2 weeks of increased chest congestion and chest tightness but very productive cough. GI viral gastroenteritis pattern over this weekend seems to be a different problem. We had called in Augmentin which further aggravated loose stools through his colostomy. He is not on a routine probiotic. Today feeling better. Denies fever or sore throat.  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 ACUTE VISIT: SOB increased and yellow productive cough, labored breathing when talking,etc. Feelings of drowning and passing out; was given Doxycycline and Cipro We had stopped Augmentin at last visit because of watery diarrhea attributed to the antibiotic, although it may have been a separate viral gastroenteritis. He now says he chose to continue the Augmentin. We were closed first no last week he called the on-call doctor to get prescriptions for doxycycline and Cipro. Prednisone already being taken for his colitis was increased to 40 mg daily for wheezing. Cough was very productive. Today he asks he worked in, reporting more shortness of breath in the last few days "scary". Using rescue inhaler and resumed Symbicort for  modest benefit. He notices some wheezing lying down but not when he is upright. Arthralgias in   knees, no rash, fever or purulent sputum. He had past history of DVT complicating his colectomy by report. CXR 07/19/12- IMPRESSION:  Central peribronchial thickening, probably indicative of  bronchitis. No frank edema or consolidation.  Original Report Authenticated By: Bretta Bang, M.D.  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 FOLLOWS FOR: was told had blood clot and currently on coumadin now; would like to discuss coming off of it if possible.  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 Lovenox/warfarin lead to rectal bleeding-now on warfarin alone. Followed by hematologist at [facility]. Question this is an old clot -he did have DVT after previous surgery- but probably new/recurrent. No hemoptysis. He feels pneumonia resolved. Chest feels well. Home health nurse following INR. CXR 08/14/12 IMPRESSION:  Persistent patchy opacities in the left upper lobe and right base  dating back to 07/16/2012. Findings likely reflect residual changes  of pneumonia or potentially chronic underlying scarring/fibrosis.  Recommend additional repeat chest x-ray in 2 - 4 weeks. If there  is still no interval change, further evaluation with CT scan of the  chest would likely be warranted to further evaluate.  Original Report Authenticated By: Malachy Moan, M.D.   ROS-see HPI Constitutional:   No-   weight loss, night sweats, fevers, chills, +fatigue, lassitude. HEENT:   No-  headaches, difficulty swallowing, tooth/dental problems, sore throat,       No-  sneezing, itching, ear ache, nasal congestion, post nasal drip,  CV:  No-   chest pain, orthopnea, PND, swelling in lower extremities, anasarca, dizziness, palpitations Resp: + shortness of breath with exertion or at rest.              No- productive cough,  No-  non-productive cough,  No- coughing up of blood.              No-  change in color of mucus.  No- wheezing.   Skin: No-   rash or lesions. GI:  No-   heartburn, indigestion, abdominal pain, no- nausea, vomiting,  GU:  MS:  No-   joint pain or swelling.   Neuro-     nothing unusual Psych:  No- change in mood or affect. No depression or anxiety.  No memory loss.  OBJ- Physical Exam BP 118/70  Pulse 93  Ht 6' (1.829 m)  Wt 226 lb 9.6 oz (102.785 kg)  BMI 30.73 kg/m2  SpO2 96% General- Alert, Oriented, Affect-appropriate, Distress- none acute, albino, trim, Room Air Skin- rash-none, lesions- none, excoriation- none Lymphadenopathy- none Head- atraumatic            Eyes-  PERRLA, conjunctivae and secretions clear. +Strabismus, +nystagmus            Ears- Hearing, canals-normal            Nose- Clear, no-Septal dev, mucus, polyps, erosion, perforation             Throat- Mallampati II , mucosa clear , drainage- none, tonsils- atrophic Neck- flexible , trachea midline, no stridor , thyroid nl, carotid no bruit Chest - symmetrical excursion , unlabored           Heart/CV- RRR , no murmur , no gallop  , no rub, nl s1 s2                           -  JVD- none , edema-+trace, stasis changes- none, varices- none           Lung- clear to P&A, wheeze- none, cough+light , dullness-none, rub- none           Chest wall-  Abd- +colostomy Gen/ Rectal- Not done, not indicated Extrem- cyanosis- none, clubbing, none, atrophy- none, strength- nl. Homan's- neg bilaterally. Neuro- grossly intact to observation except nystagmus

## 2012-08-29 NOTE — Telephone Encounter (Signed)
yes

## 2012-09-03 ENCOUNTER — Telehealth: Payer: Self-pay | Admitting: Internal Medicine

## 2012-09-03 MED ORDER — WARFARIN SODIUM 2.5 MG PO TABS: 2.5000 mg | ORAL_TABLET | Freq: Every day | ORAL | Status: DC

## 2012-09-03 NOTE — Telephone Encounter (Signed)
Leitha Bleak with [facility] to ok orders for 2-3 more home visit//LMOVM

## 2012-09-03 NOTE — Telephone Encounter (Signed)
Patient is calling to request a refill of his Coumadin 2.5mg  once daily.  Patient has been on coumadin since being released from the hospital in February.  Original prescription was given by Hospitalist per patient.  Patient states that he is supposed to be on it for a total of 3 months.  Preferred pharmacy is [facility] in [place].  Patient will be out of medication if do not get refill today.   OFFICE NOTE: PLEASE FOLLOW UP ON MEDICATION REFILL REQUEST.

## 2012-09-04 ENCOUNTER — Ambulatory Visit (INDEPENDENT_AMBULATORY_CARE_PROVIDER_SITE_OTHER): Payer: 59 | Admitting: Cardiology

## 2012-09-04 DIAGNOSIS — E70331 Hermansky-Pudlak syndrome: Secondary | ICD-10-CM

## 2012-09-04 DIAGNOSIS — Z7901 Long term (current) use of anticoagulants: Secondary | ICD-10-CM

## 2012-09-04 NOTE — Assessment & Plan Note (Signed)
Recent pneumonia was probably infection rather than pulmonary embolism. He has now cleared symptomatically after Augmentin

## 2012-09-04 NOTE — Assessment & Plan Note (Signed)
[facility] will decide how long he should continue warfarin. We discussed risk of bleeding given history of colectomy, versus recurrent DVT. Considered "pneumonia" may have been pulmonary embolism

## 2012-09-11 ENCOUNTER — Ambulatory Visit (INDEPENDENT_AMBULATORY_CARE_PROVIDER_SITE_OTHER): Payer: 59 | Admitting: General Practice

## 2012-09-11 DIAGNOSIS — E70331 Hermansky-Pudlak syndrome: Secondary | ICD-10-CM

## 2012-09-11 DIAGNOSIS — Z7901 Long term (current) use of anticoagulants: Secondary | ICD-10-CM

## 2012-09-18 ENCOUNTER — Ambulatory Visit (INDEPENDENT_AMBULATORY_CARE_PROVIDER_SITE_OTHER): Payer: 59 | Admitting: General Practice

## 2012-09-18 DIAGNOSIS — E70331 Hermansky-Pudlak syndrome: Secondary | ICD-10-CM

## 2012-09-18 DIAGNOSIS — Z7901 Long term (current) use of anticoagulants: Secondary | ICD-10-CM

## 2012-09-18 LAB — POCT INR: INR: 2.7

## 2012-09-19 ENCOUNTER — Telehealth: Payer: Self-pay

## 2012-09-19 NOTE — Telephone Encounter (Signed)
Message copied by Sandi Mealy on Thu Sep 19, 2012  8:24 AM ------      Message from: Lodema Pilot D      Created: Wed Sep 18, 2012  1:01 PM       [facility] RN is Rosalita Chessman.  She just called to say that pt is blind and that may reason enough to extend visits through May.  She also said that he needs a lot of reinforcement.            Cindy ------

## 2012-09-19 NOTE — Telephone Encounter (Signed)
Home health RN is asking for more visits through May. Please advise. Her number is [phone]. She will take a verbal order. I think her name is Gilman Schmidt.  Arline Asp

## 2012-09-19 NOTE — Telephone Encounter (Signed)
Returned call to [phone], no personal VM or greeting to determine if correct#. No messg left on VM, closing phone note unsure where to call.

## 2012-09-19 NOTE — Telephone Encounter (Signed)
ok 

## 2012-09-19 NOTE — Telephone Encounter (Signed)
Please advise on verbal orders.

## 2012-09-23 ENCOUNTER — Ambulatory Visit (INDEPENDENT_AMBULATORY_CARE_PROVIDER_SITE_OTHER)
Admission: RE | Admit: 2012-09-23 | Discharge: 2012-09-23 | Disposition: A | Discharge status: Home / Self Care | Payer: 59 | Source: Ambulatory Visit | Attending: Internal Medicine | Admitting: Internal Medicine

## 2012-09-23 ENCOUNTER — Telehealth: Payer: Self-pay | Admitting: Internal Medicine

## 2012-09-23 ENCOUNTER — Ambulatory Visit (INDEPENDENT_AMBULATORY_CARE_PROVIDER_SITE_OTHER): Payer: 59 | Admitting: Internal Medicine

## 2012-09-23 ENCOUNTER — Encounter: Payer: Self-pay | Admitting: Internal Medicine

## 2012-09-23 ENCOUNTER — Other Ambulatory Visit (INDEPENDENT_AMBULATORY_CARE_PROVIDER_SITE_OTHER): Payer: 59

## 2012-09-23 VITALS — BP 118/82 | HR 91 | Ht 73.0 in | Wt 237.0 lb

## 2012-09-23 DIAGNOSIS — Z8701 Personal history of pneumonia (recurrent): Secondary | ICD-10-CM

## 2012-09-23 DIAGNOSIS — J849 Interstitial pulmonary disease, unspecified: Secondary | ICD-10-CM

## 2012-09-23 DIAGNOSIS — J8410 Pulmonary fibrosis, unspecified: Secondary | ICD-10-CM

## 2012-09-23 DIAGNOSIS — J189 Pneumonia, unspecified organism: Secondary | ICD-10-CM

## 2012-09-23 LAB — CBC WITH DIFFERENTIAL/PLATELET
Basophils Relative: 0.3 % (ref 0.0–3.0)
Eosinophils Relative: 1.8 % (ref 0.0–5.0)
HCT: 37.3 % — ABNORMAL LOW (ref 39.0–52.0)
Hemoglobin: 12.1 g/dL — ABNORMAL LOW (ref 13.0–17.0)
Lymphocytes Relative: 25.7 % (ref 12.0–46.0)
Lymphs Abs: 2 10*3/uL (ref 0.7–4.0)
Monocytes Relative: 8 % (ref 3.0–12.0)
Neutro Abs: 5.1 10*3/uL (ref 1.4–7.7)
RBC: 4.54 Mil/uL (ref 4.22–5.81)
WBC: 8 10*3/uL (ref 4.5–10.5)

## 2012-09-23 NOTE — Progress Notes (Signed)
Subjective:    Patient ID: [name], male    DOB: March 28, 1981, 32 y.o.   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   Here with friend [name] Last here August 09, 2010 Currently says breathing is pretty good. Had temp 100 degrees x 1 week, sore throat, tender left cervical node. Had dieted and lost 40 lbs since off prednisone x 6 months. Now on 6-mercaptopurine (which drops WBC to 3000) and Remicade. These drugs are managed by Dr Sherrill/ Heme Onc and Dr Schooler/ GI. Denies wheeze, phlegm. Took oxycodone this AM for pains. For the past year has had some dry cough. Concerned about this as an early fibrosis marker. Wants PFT every 6 months. He plans to go( unscheduled so far) for CXR and [facility] so he asks not to do a cxr here so as to minimize radiation exposure.    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   Since last here he had a total colectomy in 2012. Had ER visit at [facility] 03/24/2011 and CT scan suggested "something in lung" which he wanted to review. Treated with Levaquin. Has also had ER visit for kidney stone. Has stage III renal disease with 20% function-not known why. CXR 11/26/11- he reports "much clearer". Images were available on discs he brought from [facility]. We reviewed the images together. It looks as if he was dealing with a pneumonia in October with some residual density-pneumonia versus atelectasis-on the recent chest x-ray. He has minimal cough and no acute chest symptoms. Some of his interstitial markings may have been a little prominent but there was no dramatic interstitial fibrosis.  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 ACUTE VISIT: about 1 week or so ago started having cough, congestion, and had stomach flu over the weekend; noticed increase in mucus-started abx yesterday and has reduced the mucus amount. Major concern was breathing issues-felt unable to get breath and go any further. Noiced wheezing as well.We had called in augmentin yesterday to start because of his concern he was getting pneumonia. Describes 2 weeks of increased chest congestion and chest tightness but very productive cough. GI viral gastroenteritis pattern over this weekend seems to be a different problem. We had called in Augmentin which further aggravated loose stools through his colostomy. He is not on a routine probiotic. Today feeling better. Denies fever or sore throat.  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 ACUTE VISIT: SOB increased and yellow productive cough, labored breathing when talking,etc. Feelings of drowning and passing out; was given Doxycycline and Cipro We had stopped Augmentin at last visit because of watery diarrhea attributed to the antibiotic, although it may have been a separate viral gastroenteritis. He now says he chose to continue the Augmentin. We were closed first no last week he called the on-call doctor to get prescriptions for doxycycline and Cipro. Prednisone already being taken for his colitis was increased to 40 mg daily for wheezing. Cough was very productive. Today he asks he worked in, reporting more shortness of breath in the last few days "scary". Using rescue inhaler and resumed Symbicort for  modest benefit. He notices some wheezing lying down but not when he is upright. Arthralgias in   knees, no rash, fever or purulent sputum. He had past history of DVT complicating his colectomy by report. CXR 07/19/12- IMPRESSION:  Central peribronchial thickening, probably indicative of  bronchitis. No frank edema or consolidation.  Original Report Authenticated By: Bretta Bang, M.D.  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 FOLLOWS FOR: was told had blood clot and currently on coumadin now; would like to discuss coming off of it if possible.  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 Lovenox/warfarin lead to rectal bleeding-now on warfarin alone. Followed by [facility]ologist at [facility]. Question this is an old clot -he did have DVT after previous surgery- but probably new/recurrent. No hemoptysis. He feels pneumonia resolved. Chest feels well. Home health nurse following INR. CXR 08/14/12 IMPRESSION:  Persistent patchy opacities in the left upper lobe and right base  dating back to 07/16/2012. Findings likely reflect residual changes  of pneumonia or potentially chronic underlying scarring/fibrosis.  Recommend additional repeat chest x-ray in 2 - 4 weeks. If there  is still no interval change, further evaluation with CT scan of the  chest would likely be warranted to further evaluate.  Original Report Authenticated By: Malachy Moan, M.D.  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 ACUTE VISIT: having trouble breathing x 2 weeks(happens with minor exertion) denies any wheezing, cough, or congestion. He started Zyrtec 2 weeks ago. Not using Symbicort "just too many meds" and not wheezing. He notes easy dyspnea on exertion  across a room or a telephone. Denies phlegm or cough, fever, sweats or chills. Feet are not swelling. We discussed anemia given his history of GI bleed. CXR 09/04/12 IMPRESSION:  Persistent streaky airspace/infiltrates bilateral upper and lower  lobe with central distribution. The findings probable due to  persistent pneumonia superimposed on chronic interstitial lung  disease. Slight worsening in aeration right upper lobe. Follow-up  to resolution is recommended. Further evaluation with CT of the  chest could be performed as clinically warranted.  Original Report Authenticated By: Natasha Mead, M.D.  ROS-see HPI Constitutional:   No-   weight loss, night sweats, fevers, chills, +fatigue, lassitude. HEENT:   No-  headaches, difficulty swallowing, tooth/dental problems, sore throat,       No-  sneezing, itching, ear ache, nasal congestion, post nasal drip,  CV:  No-   chest pain, orthopnea, PND, swelling in lower extremities, anasarca, dizziness, palpitations Resp: + shortness of breath with exertion or at rest.              No- productive cough,  No- non-productive cough,  No- coughing up of blood.              No-  change in color of mucus.  No- wheezing.   Skin: No-   rash or lesions. GI:  No-   heartburn, indigestion, abdominal pain, no- nausea, vomiting,  GU:  MS:  No-   joint pain or swelling.   Neuro-     nothing unusual Psych:  No- change in mood or affect. No depression or anxiety.  No memory loss.  OBJ- Physical Exam BP 118/82  Pulse 91  Ht 6\' 1"  (1.854 m)  Wt 237 lb (107.502 kg)  BMI  31.27 kg/m2  SpO2 98% General- Alert, Oriented, Affect-appropriate, Distress- none acute, albino, Room Air Skin- rash-none, lesions- none, excoriation- none Lymphadenopathy- none Head- atraumatic            Eyes-  PERRLA, conjunctivae and secretions clear. +Strabismus, +nystagmus            Ears- Hearing, canals-normal            Nose- Clear, no-Septal dev, mucus, polyps, erosion, perforation              Throat- Mallampati II , mucosa clear , drainage- none, tonsils- atrophic Neck- flexible , trachea midline, no stridor , thyroid nl, carotid no bruit Chest - symmetrical excursion , unlabored           Heart/CV- RRR , no murmur , no gallop  , no rub, nl s1 s2                           - JVD- none , edema-+trace, stasis changes- none, varices- none           Lung- clear to P&A/ no crackles heard, wheeze- none, cough+light , dullness-none, rub- none. +He desaturated to 90% on room air while walking from the waiting room but quickly recovered to 98% at rest.           Chest wall-  Abd- +colostomy Gen/ Rectal- Not done, not indicated Extrem- cyanosis- none, clubbing, none, atrophy- none, strength- nl.  Neuro- grossly intact to observation except nystagmus

## 2012-09-23 NOTE — Progress Notes (Signed)
Quick Note:  ATC, phone went straight to VM and unable to leave msg "mailbox is full" WCB ______

## 2012-09-23 NOTE — Telephone Encounter (Signed)
ATC patient, phone went straight to VM and unable to leave voicemail because " mailbox is full" WCB  Result Note    Slight anemia. Hgb not low enough to cause discomfort.

## 2012-09-23 NOTE — Patient Instructions (Addendum)
Ambulate for oximetry on room air- done  Order- CXR                      Dx interstitial lung disease hx of pneumonia              Lab CBC  Order- schedule PFT              Schedule ONOX on room air

## 2012-09-24 NOTE — Progress Notes (Signed)
Quick Note:  Phone note created for results > please see for details. ______

## 2012-09-24 NOTE — Telephone Encounter (Signed)
Pt returned call and can be reached @ [phone].  Pt asked for a detailed message to be left on his vm if he doesn't answer. Leanora Ivanoff

## 2012-09-24 NOTE — Telephone Encounter (Signed)
ATC pt NA wcb 

## 2012-09-24 NOTE — Telephone Encounter (Signed)
Spoke with pt and given lab and cxr results per Dr Maple Hudson.  Pt is scheduled for PFT here on 10-17-12 and doesn't really want to wait that long.  Pt wants to know if CY would consider ordering PFT at another facility to get it done sooner.  Please advise

## 2012-09-24 NOTE — Telephone Encounter (Signed)
Please be advised, pt also has cxr results from 4.14.14:  Result Notes    Notes Recorded by Waymon Budge, MD on 09/23/2012 at 5:17 PM CXR- Faint diffuse thickening is seen in the lung tissue, probably advanced just a little since February. Let's see what the PFT looks like and we will consider when to update CT.

## 2012-09-24 NOTE — Progress Notes (Signed)
Quick Note:  Phone note created for results > please see 4.14.14 phone note for details. ______

## 2012-09-25 ENCOUNTER — Telehealth: Payer: Self-pay | Admitting: Internal Medicine

## 2012-09-25 NOTE — Telephone Encounter (Signed)
Pt called to check on status.  [name] ° °

## 2012-09-25 NOTE — Telephone Encounter (Signed)
ONO order placed at 4.14.14 ov w/ CY [facility], spoke with [place] who stated that the oximetry is done thru the DME department which is totally different than the nursing dept who will be performing the blood draw However, she will contact that dept to see if his oximetry can be delivered today Per Jana Hakim, she will contact the patient to discuss this as well LMOM TCB x1 to inform pt of the above

## 2012-09-25 NOTE — Telephone Encounter (Signed)
Pt returned call Discuss recs below with patient Pt okay with this and verbalized his understanding Nothing further needed from this standpoint Will sign off

## 2012-09-25 NOTE — Telephone Encounter (Signed)
Spoke with patient, advised that CY is currently seeing patients this morning but as soon as we have his response we will call him back.  Pt verbalized his understanding and is okay with this - please leave detailed VM with CY's recs.  Dr Maple Hudson please advise, thanks!

## 2012-09-26 ENCOUNTER — Other Ambulatory Visit: Payer: Self-pay | Admitting: General Practice

## 2012-09-26 ENCOUNTER — Ambulatory Visit (INDEPENDENT_AMBULATORY_CARE_PROVIDER_SITE_OTHER): Payer: 59 | Admitting: General Practice

## 2012-09-26 DIAGNOSIS — E70331 Hermansky-Pudlak syndrome: Secondary | ICD-10-CM

## 2012-09-26 DIAGNOSIS — Z7901 Long term (current) use of anticoagulants: Secondary | ICD-10-CM

## 2012-09-26 MED ORDER — WARFARIN SODIUM 2.5 MG PO TABS: ORAL_TABLET | ORAL | Status: DC

## 2012-09-26 NOTE — Telephone Encounter (Signed)
Pt called back. Says he has called [facility] several times / left messages but has not yet heard from anyone re: setting up ONO. Pt wants to hear from someone today. Hazel Sams

## 2012-09-26 NOTE — Telephone Encounter (Signed)
ATC patient at given number-mailbox is full and unable to leave a message; CY got forms today for ONO request-I have faxed this back today. Pt should hear from [facility] early part of next week.

## 2012-09-29 NOTE — Assessment & Plan Note (Addendum)
Interstitial lung disease is part of his syndrome. We think we may be seeing fibrosis developing as response to inflammatory events like pneumonia. Plan-overnight oximetry, PFT, chest x-ray

## 2012-09-30 ENCOUNTER — Telehealth: Payer: Self-pay | Admitting: Internal Medicine

## 2012-09-30 ENCOUNTER — Telehealth: Payer: Self-pay | Admitting: General Practice

## 2012-09-30 NOTE — Telephone Encounter (Signed)
ATC the pt, NA and mailbox is full so unable to leave msg [facility]

## 2012-09-30 NOTE — Telephone Encounter (Signed)
Patient states that he took 5 mg coumadin on Saturday 4/19 and Sunday 4/20.  Instructed patient to take 7.5 mg coumadin today 4/21, 4/22 and 4/23 and re-check INR on Thursday 4/24. Patient did miss 2 or 3 days of coumadin because he ran out.

## 2012-09-30 NOTE — Telephone Encounter (Signed)
ATC patient no answer LMOMTCB 

## 2012-09-30 NOTE — Telephone Encounter (Signed)
Duplicate msg from 09/23/12. Will close this and pls refer to previous msg.

## 2012-10-01 NOTE — Telephone Encounter (Signed)
Spoke with patient would like Dr. Maple Hudson to address the following: Patient has been having increasing SOB since beginning of March 2014 when it was thought he had pna. Patient states he has started back using his maintenance inhaler as well as albuterol rescue inhaler more often than before. Patient states he is aware recent cxr showed thickening in lungs and also saw on AVS Dx: interstitial lung disease. Patient wants to know is the increasing SOB,lung thickening and dx of ild related to asthma or is Dr. Maple Hudson thinking he is progressing more towards pulm fibrosis? Patient also states he has not had ONO done yet, last time he spoke with [facility] last week they stated they were "waiting on equipment" Dr. Maple Hudson Please Advise, thank you   ----- Spoke with Jana Hakim from [facility] she stated she did speak with patient last week as well o2 Care Team and this was to be taken care of. Stated she will follow back up with o2 Care Team as she did not know this had not been done and will call our office back with the status of patients ONO

## 2012-10-01 NOTE — Telephone Encounter (Signed)
Spoke with patient-aware of answers from CY-and okay with getting CT Chest non contrast. Pt is having PFT done this Friday at 10am. Order has been placed for POC to work on and let patient know of date and time.

## 2012-10-01 NOTE — Telephone Encounter (Signed)
Follow up return call from [facility] She stated that there was an issue with their equipment Pt is now scheduled for ONO tomorrow Will forward back to CY to address the pt's concerns below

## 2012-10-01 NOTE — Telephone Encounter (Signed)
Yes, this may be pulmonary fibrosis. [facility] should be about ready to do the overnight oximetry. I would like [place] to schedule CT chest, no contrast, for dx pulmonary fibrosis.

## 2012-10-02 ENCOUNTER — Ambulatory Visit (INDEPENDENT_AMBULATORY_CARE_PROVIDER_SITE_OTHER)
Admission: RE | Admit: 2012-10-02 | Discharge: 2012-10-02 | Disposition: A | Discharge status: Home / Self Care | Payer: 59 | Source: Ambulatory Visit | Attending: Internal Medicine | Admitting: Internal Medicine

## 2012-10-02 DIAGNOSIS — J8410 Pulmonary fibrosis, unspecified: Secondary | ICD-10-CM

## 2012-10-03 ENCOUNTER — Ambulatory Visit (INDEPENDENT_AMBULATORY_CARE_PROVIDER_SITE_OTHER): Payer: 59 | Admitting: General Practice

## 2012-10-03 ENCOUNTER — Telehealth: Payer: Self-pay | Admitting: Internal Medicine

## 2012-10-03 DIAGNOSIS — Z7901 Long term (current) use of anticoagulants: Secondary | ICD-10-CM

## 2012-10-03 DIAGNOSIS — E70331 Hermansky-Pudlak syndrome: Secondary | ICD-10-CM

## 2012-10-03 NOTE — Telephone Encounter (Signed)
I spoke with pt and he is wanting to know what level of fibrosis is shown in his lungs. Pt is scheduled to have PFT done tomorrow. Pt had ONO done last night and is wanting to know if we received this yet or not. Please advise Dr. Maple Hudson thanks

## 2012-10-03 NOTE — Progress Notes (Signed)
Quick Note:  ATC patient no answer. Left detailed message w results on VM as requested and advised if he had questions/concerns to return our call.  ______

## 2012-10-03 NOTE — Telephone Encounter (Signed)
ATC patient no answer. Left detailed message w results on VM as requested and advised if he had questions/concerns to return our call. -----    Result Note    CT chest does show that some pulmonary fibrosis has developed since 2 years ago. We will discuss at next ov.   Will close msg at this time

## 2012-10-04 ENCOUNTER — Ambulatory Visit (INDEPENDENT_AMBULATORY_CARE_PROVIDER_SITE_OTHER): Payer: 59 | Admitting: Internal Medicine

## 2012-10-04 ENCOUNTER — Other Ambulatory Visit: Payer: Self-pay | Admitting: Internal Medicine

## 2012-10-04 ENCOUNTER — Telehealth: Payer: Self-pay | Admitting: Internal Medicine

## 2012-10-04 DIAGNOSIS — J8410 Pulmonary fibrosis, unspecified: Secondary | ICD-10-CM

## 2012-10-04 DIAGNOSIS — J45909 Unspecified asthma, uncomplicated: Secondary | ICD-10-CM

## 2012-10-04 LAB — PULMONARY FUNCTION TEST

## 2012-10-04 NOTE — Telephone Encounter (Signed)
Pt is aware that CY stated to Jerolyn Shin he would call the patient this afternoon regarding results. Pt was given a copy of CT results and also moved his appointment with CY to Tuesday 10-08-12 at 1:45pm instead of June 2014. Will forward back to CY as FYI and to call patient.

## 2012-10-04 NOTE — Telephone Encounter (Signed)
[name] had his PFT done today at 10am and is requesting that this be reviewed while he waits for his ride. [name] is in the lobby now. Per Florentina Addison forward message to Dr. Maple Hudson and she will work on getting the PFT results.Carron Curie, CMA

## 2012-10-04 NOTE — Telephone Encounter (Signed)
I never got the PFT to review. Will call him Monday.

## 2012-10-04 NOTE — Telephone Encounter (Signed)
Spoke with patient today after his PFT and he is aware that we have not seen any results as he states [facility] just picked up machine last night. Pt aware that we should have the results to review with him at his Tuesday 10-08-12 appointment with CY.

## 2012-10-04 NOTE — Telephone Encounter (Signed)
Spoke with pt.  He hasn't heard anything from [facility] regarding having o2 set up.  He doesn't want to go through the weekend without this. I spoke with Bjorn Loser.  She spoke with [facility] with [facility].  They will have o2 delivered within 2 hours today.  Pt states he was also advised that Dr. Maple Hudson would be calling him today regarding his PFT results. He would like to know if Dr. Maple Hudson will still be able to do this today or not. Dr. Maple Hudson is out of the office.  Katie spoke with Dr. Maple Hudson.  Dr. Maple Hudson will address these results on Monday. Katie spoke with pt and explained this to him.  He verbalized understanding.   Katie provided pt with [facility] # incase he doesn't hear from them within 2 hours to have this set up.  Will route msg to Dr. Maple Hudson to advise on PFT results on Monday.  Thank you.

## 2012-10-04 NOTE — Progress Notes (Signed)
PFT done today. 

## 2012-10-07 NOTE — Telephone Encounter (Signed)
PFT- mild slowing of airflow in small airway branches. This is beginning to slow how quickly lungs can get oxygen. That fits with his now needing oxygen to sleep. We will discuss at next ov.

## 2012-10-07 NOTE — Telephone Encounter (Signed)
Pt is aware of PFT results. 

## 2012-10-07 NOTE — Telephone Encounter (Signed)
Patient returning call.

## 2012-10-07 NOTE — Telephone Encounter (Signed)
LMTCB

## 2012-10-08 ENCOUNTER — Ambulatory Visit (INDEPENDENT_AMBULATORY_CARE_PROVIDER_SITE_OTHER): Payer: 59 | Admitting: Internal Medicine

## 2012-10-08 ENCOUNTER — Encounter: Payer: Self-pay | Admitting: Internal Medicine

## 2012-10-08 VITALS — BP 130/90 | HR 101 | Ht 73.0 in | Wt 239.6 lb

## 2012-10-08 DIAGNOSIS — E70331 Hermansky-Pudlak syndrome: Secondary | ICD-10-CM

## 2012-10-08 DIAGNOSIS — J8410 Pulmonary fibrosis, unspecified: Secondary | ICD-10-CM

## 2012-10-08 DIAGNOSIS — D509 Iron deficiency anemia, unspecified: Secondary | ICD-10-CM

## 2012-10-08 DIAGNOSIS — Z7901 Long term (current) use of anticoagulants: Secondary | ICD-10-CM

## 2012-10-08 NOTE — Patient Instructions (Addendum)
Order- DME [facility]- Change oxygen order to continuous and portable to allow 2L for sleep, 2-3 l portable for exertion   Dx pulmonary fibrosis  Goal O2 sat 90-94%

## 2012-10-08 NOTE — Progress Notes (Signed)
Subjective:    Patient ID: [name], male    DOB: June 30, 1980, 32 y.o.   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   Here with friend [name] Last here August 09, 2010 Currently says breathing is pretty good. Had temp 100 degrees x 1 week, sore throat, tender left cervical node. Had dieted and lost 40 lbs since off prednisone x 6 months. Now on 6-mercaptopurine (which drops WBC to 3000) and Remicade. These drugs are managed by Dr Sherrill/ Heme Onc and Dr Schooler/ GI. Denies wheeze, phlegm. Took oxycodone this AM for pains. For the past year has had some dry cough. Concerned about this as an early fibrosis marker. Wants PFT every 6 months. He plans to go( unscheduled so far) for CXR and BAL at the [facility] so he asks not to do a cxr here so as to minimize radiation exposure.    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   Since last here he had a total colectomy in 2012. Had ER visit at [facility] 03/24/2011 and CT scan suggested "something in lung" which he wanted to review. Treated with Levaquin. Has also had ER visit for kidney stone. Has stage III renal disease with 20% function-not known why. CXR 11/26/11- he reports "much clearer". Images were available on discs he brought from [facility]. We reviewed the images together. It looks as if he was dealing with a pneumonia in October with some residual density-pneumonia versus atelectasis-on the recent chest x-ray. He has minimal cough and no acute chest symptoms. Some of his interstitial markings may have been a little prominent but there was no dramatic interstitial fibrosis.  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 ACUTE VISIT: about 1 week or so ago started having cough, congestion, and had stomach flu over the weekend; noticed increase in mucus-started abx yesterday and has reduced the mucus amount. Major concern was breathing issues-felt unable to get breath and go any further. Noiced wheezing as well.We had called in augmentin yesterday to start because of his concern he was getting pneumonia. Describes 2 weeks of increased chest congestion and chest tightness but very productive cough. GI viral gastroenteritis pattern over this weekend seems to be a different problem. We had called in Augmentin which further aggravated loose stools through his colostomy. He is not on a routine probiotic. Today feeling better. Denies fever or sore throat.  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 ACUTE VISIT: SOB increased and yellow productive cough, labored breathing when talking,etc. Feelings of drowning and passing out; was given Doxycycline and Cipro We had stopped Augmentin at last visit because of watery diarrhea attributed to the antibiotic, although it may have been a separate viral gastroenteritis. He now says he chose to continue the Augmentin. We were closed first no last week he called the on-call doctor to get prescriptions for doxycycline and Cipro. Prednisone already being taken for his colitis was increased to 40 mg daily for wheezing. Cough was very productive. Today he asks he worked in, reporting more shortness of breath in the last few days "scary". Using rescue inhaler and resumed Symbicort for  modest benefit. He notices some wheezing lying down but not when he is upright. Arthralgias in   knees, no rash, fever or purulent sputum. He had past history of DVT complicating his colectomy by report. CXR 07/19/12- IMPRESSION:  Central peribronchial thickening, probably indicative of  bronchitis. No frank edema or consolidation.  Original Report Authenticated By: Bretta Bang, M.D.  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 FOLLOWS FOR: was told had blood clot and currently on coumadin now; would like to discuss coming off of it if possible.  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 Lovenox/warfarin lead to rectal bleeding-now on warfarin alone. Followed by hematologist at [facility]. Question this is an old clot -he did have DVT after previous surgery- but probably new/recurrent. No hemoptysis. He feels pneumonia resolved. Chest feels well. Home health nurse following INR. CXR 08/14/12 IMPRESSION:  Persistent patchy opacities in the left upper lobe and right base  dating back to 07/16/2012. Findings likely reflect residual changes  of pneumonia or potentially chronic underlying scarring/fibrosis.  Recommend additional repeat chest x-ray in 2 - 4 weeks. If there  is still no interval change, further evaluation with CT scan of the  chest would likely be warranted to further evaluate.  Original Report Authenticated By: Malachy Moan, M.D.  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 ACUTE VISIT: having trouble breathing x 2 weeks(happens with minor exertion) denies any wheezing, cough, or congestion. He started Zyrtec 2 weeks ago. Not using Symbicort "just too many meds" and not wheezing. He notes easy dyspnea on exertion  across a room or a telephone. Denies phlegm or cough, fever, sweats or chills. Feet are not swelling. We discussed anemia given his history of GI bleed. CXR 09/04/12 IMPRESSION:  Persistent streaky airspace/infiltrates bilateral upper and lower  lobe with central distribution. The findings probable due to  persistent pneumonia superimposed on chronic interstitial lung  disease. Slight worsening in aeration right upper lobe. Follow-up  to resolution is recommended. Further evaluation with CT of the  chest could be performed as clinically warranted.  Original Report Authenticated By: Natasha Mead, M.D.  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 Father and wife are here. Follow up to discuss test results.  States o2 sats are staying 88-90% RA during the day. He had an apparent pneumonia in February. Shortness of breath improved with antibiotics been diagnosed with recurrent DVT/PE which is followed by hematology at [facility] with plan to treat with anticoagulation for 3 months. He had a GI bleed. INR has been labile. Now on iron for anemia. In the last few weeks he has felt more short of breath especially with exertion, breathless with speech on room air. Not much effect from his inhalers. He gave disc of his CT scan to pulmonary at [facility]- they have expert on his syndrome. Using the elliptical trainer for exercise, 30 minutes is getting too hard and he has reduced to 10 minute sessions. ONOX- results pending PFT-10/04/2012-mild obstructive airways disease with response to bronchodilator, borderline mild air trapping with increased RV, diffusion severely reduced. FVC 4.30/74%, FEV1 3.45/77%, FEV1/FVC 0.80. TLC 92%, RV 121%, DLCO 40%. CT 4/24 /14  IMPRESSION:  Slightly upper lobe predominant  peribronchovascular fibrosis, as   detailed above. This is new since 07/28/2008.  No evidence of acute superimposed process.  Mild thoracic adenopathy, favored to be reactive.  Original Report Authenticated By: Jeronimo Greaves, M.D.  ROS-see HPI Constitutional:   No-   weight loss, night sweats, fevers, chills,  sore throat,       No-  sneezing, itching, ear ache, nasal congestion, post nasal drip,  CV:  No-   chest pain, orthopnea, PND, swelling in lower extremities, anasarca, dizziness, palpitations Resp: + shortness of breath with exertion or at rest.              No- productive cough,  No- non-productive cough,  No- coughing up of blood.              No-  change in color of mucus.  No- wheezing.   Skin: No-   rash or lesions. GI:  No-   heartburn, indigestion, abdominal pain, no- nausea, vomiting,  GU:  MS:  No-   joint pain or swelling.   Neuro-     nothing unusual Psych:  + change in mood or affect. + depression or anxiety.  No memory loss.  OBJ- Physical Exam BP 130/90  Pulse 101  Ht 6\' 1"  (1.854 m)  Wt 239 lb 9.6 oz (108.682 kg)  BMI 31.62 kg/m2  SpO2 95% General- Alert, Oriented, Affect-appropriate, Distress- none acute, albino, Room Air Skin- +Albino. +some ecchymoses on his arms Lymphadenopathy- none Head- atraumatic            Eyes-  PERRLA, conjunctivae and secretions clear. +Strabismus, +nystagmus            Ears- Hearing, canals-normal            Nose- Clear, no-Septal dev, mucus, polyps, erosion, perforation             Throat- Mallampati II , mucosa clear , drainage- none, tonsils- atrophic Neck- flexible , trachea midline, no stridor , thyroid nl, carotid no bruit Chest - symmetrical excursion , unlabored           Heart/CV- RRR , no murmur , no gallop  , no rub, nl s1 s2                           - JVD- none , edema-none, stasis changes- none, varices- none           Lung- + trace crackles heard, wheeze- none, cough+light , dullness-none, rub- none.            Chest wall-  Abd- +colostomy Gen/  Rectal- Not done, not indicated Extrem- cyanosis- none, clubbing, none, atrophy- none, strength- nl.  Neuro- grossly intact to observation except nystagmus

## 2012-10-10 ENCOUNTER — Ambulatory Visit (INDEPENDENT_AMBULATORY_CARE_PROVIDER_SITE_OTHER): Payer: 59

## 2012-10-10 ENCOUNTER — Telehealth: Payer: Self-pay | Admitting: Internal Medicine

## 2012-10-10 ENCOUNTER — Other Ambulatory Visit (INDEPENDENT_AMBULATORY_CARE_PROVIDER_SITE_OTHER): Payer: 59

## 2012-10-10 DIAGNOSIS — J45909 Unspecified asthma, uncomplicated: Secondary | ICD-10-CM

## 2012-10-10 DIAGNOSIS — J309 Allergic rhinitis, unspecified: Secondary | ICD-10-CM

## 2012-10-10 DIAGNOSIS — J8410 Pulmonary fibrosis, unspecified: Secondary | ICD-10-CM

## 2012-10-10 DIAGNOSIS — N189 Chronic kidney disease, unspecified: Secondary | ICD-10-CM

## 2012-10-10 LAB — URINALYSIS
Hgb urine dipstick: NEGATIVE
Nitrite: NEGATIVE
Urobilinogen, UA: 0.2 (ref 0.0–1.0)

## 2012-10-10 LAB — BASIC METABOLIC PANEL
Calcium: 8.6 mg/dL (ref 8.4–10.5)
GFR: 43.07 mL/min — ABNORMAL LOW (ref >60.00)
Sodium: 134 mEq/L — ABNORMAL LOW (ref 135–145)

## 2012-10-10 NOTE — Telephone Encounter (Signed)
Order was sent to [facility] for humidity to be added to O2

## 2012-10-10 NOTE — Telephone Encounter (Signed)
[facility] is also calling about his O2 pt is wanting a humidifer for his oxygen [facility] sent an order in to transition to portable oxygen they will need daytime levels    [phone] [facility]

## 2012-10-11 ENCOUNTER — Ambulatory Visit (INDEPENDENT_AMBULATORY_CARE_PROVIDER_SITE_OTHER): Payer: 59 | Admitting: General Practice

## 2012-10-11 DIAGNOSIS — J8410 Pulmonary fibrosis, unspecified: Secondary | ICD-10-CM

## 2012-10-11 DIAGNOSIS — E70331 Hermansky-Pudlak syndrome: Secondary | ICD-10-CM

## 2012-10-11 DIAGNOSIS — J849 Interstitial pulmonary disease, unspecified: Secondary | ICD-10-CM | POA: Insufficient documentation

## 2012-10-11 DIAGNOSIS — Z7901 Long term (current) use of anticoagulants: Secondary | ICD-10-CM

## 2012-10-11 DIAGNOSIS — I82409 Acute embolism and thrombosis of unspecified deep veins of unspecified lower extremity: Secondary | ICD-10-CM

## 2012-10-11 LAB — POCT INR: INR: 1.8

## 2012-10-14 ENCOUNTER — Other Ambulatory Visit: Payer: Self-pay | Admitting: *Deleted

## 2012-10-14 MED ORDER — BUSPIRONE HCL 10 MG PO TABS: 10.0000 mg | ORAL_TABLET | Freq: Three times a day (TID) | ORAL | Status: DC

## 2012-10-14 MED ORDER — BUPROPION HCL ER (SR) 150 MG PO TB12: 150.0000 mg | ORAL_TABLET | Freq: Every day | ORAL | Status: DC

## 2012-10-14 NOTE — Telephone Encounter (Signed)
Pt calling requesting refills of Wellbutrin and Buspar rx's until visit with new psychiatrist. Previous appointment with psych was canceled in April w/o pt's knowledge.

## 2012-10-15 ENCOUNTER — Telehealth: Payer: Self-pay | Admitting: *Deleted

## 2012-10-15 NOTE — Telephone Encounter (Signed)
Message copied by Ronny Bacon on Tue Oct 15, 2012  5:13 PM ------      Message from: Peever, [place] D      Created: Thu Oct 10, 2012  7:43 PM      Regarding: Magallon, mat       I cleared lab results by mistake.       BMET shows stable renal function in the range where he has been since February- no worse.      U/A was wnl.       Please let him know.        ------

## 2012-10-15 NOTE — Telephone Encounter (Signed)
Spoke with patient about results from [facility] regarding his U/A and BMET. Pt understands and states he is having concerns about his O2 needs. Pt states he has noticed he is more SOB with exertion-normally using 3L/M O2 with activity, however he has had to increase his O2 to 5L/M and still having trouble breathing.    Pt also noted that he has been around someone as of 10-02-12 that has PNA and is concerned that he may have caught something. He states he is having non productive cough with mild chest pain. He has not felt well for the past few days.   Pt is okay with a call back tomorrow regarding any recommendations that CY has. Should patient be seen, out patient CXR, etc. Thanks.

## 2012-10-15 NOTE — Telephone Encounter (Signed)
It would be better if he could be seen by someone. We need a better sense of what is going on, what saturations he is trying to maintain, etc.

## 2012-10-16 ENCOUNTER — Encounter: Payer: Self-pay | Admitting: Internal Medicine

## 2012-10-16 NOTE — Assessment & Plan Note (Signed)
This is managed by Hematologist/[facility]. He has had 2 separate episodes of DVT complicated by one episode of GI bleeding since his colectomy.

## 2012-10-16 NOTE — Assessment & Plan Note (Signed)
Inflammation of possibly a viral pneumonia during the winter does seem to have triggered onset of pulmonary fibrosis which is an expected development with his syndrome. He is appropriately anxious. He is now on clobatazole and off prednisone. We discussed 2 negative studies of perfenadone in H-P syndrome. Still he and his father would be interested if it were available. Plan-starting oxygen 2 L/[facility]

## 2012-10-16 NOTE — Telephone Encounter (Signed)
Spoke with patient-he has OV scheduled with CY on Friday 10-18-12 at 3:15pm to discuss concerns.

## 2012-10-17 ENCOUNTER — Ambulatory Visit (INDEPENDENT_AMBULATORY_CARE_PROVIDER_SITE_OTHER): Payer: 59 | Admitting: Cardiology

## 2012-10-17 DIAGNOSIS — Z7901 Long term (current) use of anticoagulants: Secondary | ICD-10-CM

## 2012-10-17 DIAGNOSIS — E70331 Hermansky-Pudlak syndrome: Secondary | ICD-10-CM

## 2012-10-17 DIAGNOSIS — J8410 Pulmonary fibrosis, unspecified: Secondary | ICD-10-CM

## 2012-10-17 LAB — POCT INR: INR: 3.1

## 2012-10-18 ENCOUNTER — Encounter: Payer: Self-pay | Admitting: Internal Medicine

## 2012-10-18 ENCOUNTER — Other Ambulatory Visit (INDEPENDENT_AMBULATORY_CARE_PROVIDER_SITE_OTHER): Payer: 59

## 2012-10-18 ENCOUNTER — Ambulatory Visit (INDEPENDENT_AMBULATORY_CARE_PROVIDER_SITE_OTHER): Payer: 59 | Admitting: Internal Medicine

## 2012-10-18 ENCOUNTER — Ambulatory Visit (INDEPENDENT_AMBULATORY_CARE_PROVIDER_SITE_OTHER)
Admission: RE | Admit: 2012-10-18 | Discharge: 2012-10-18 | Disposition: A | Discharge status: Home / Self Care | Payer: 59 | Source: Ambulatory Visit | Attending: Internal Medicine | Admitting: Internal Medicine

## 2012-10-18 VITALS — BP 118/86 | HR 120 | Ht 73.0 in | Wt 241.0 lb

## 2012-10-18 DIAGNOSIS — J8410 Pulmonary fibrosis, unspecified: Secondary | ICD-10-CM

## 2012-10-18 DIAGNOSIS — J9610 Chronic respiratory failure, unspecified whether with hypoxia or hypercapnia: Secondary | ICD-10-CM

## 2012-10-18 LAB — CBC WITH DIFFERENTIAL/PLATELET
Basophils Relative: 0.3 % (ref 0.0–3.0)
Eosinophils Relative: 1.8 % (ref 0.0–5.0)
HCT: 42.7 % (ref 39.0–52.0)
Lymphs Abs: 1.7 10*3/uL (ref 0.7–4.0)
Monocytes Relative: 9.7 % (ref 3.0–12.0)
Platelets: 235 10*3/uL (ref 150.0–400.0)
RBC: 5.15 Mil/uL (ref 4.22–5.81)
WBC: 7.8 10*3/uL (ref 4.5–10.5)

## 2012-10-18 LAB — SEDIMENTATION RATE: Sed Rate: 34 mm/hr — ABNORMAL HIGH (ref 0–22)

## 2012-10-18 MED ORDER — PREDNISONE 10 MG PO TABS: ORAL_TABLET | ORAL | Status: DC

## 2012-10-18 NOTE — Progress Notes (Signed)
Quick Note:  Spoke with patient, made him aware of results as listed below per CY Verbalized understanding and nothing further needed at this time. ______

## 2012-10-18 NOTE — Patient Instructions (Addendum)
Order- lab- CBC, Sed rate, D-dimer       Chronic respiratory failure                   - CXR                                     Dx pulmonary fibrosis  Order- [facility]- Pulse regulator for his portable PO2             Portable O2 concentrator 3 L continuous       Dx chronic hypioxic respiratory failure

## 2012-10-18 NOTE — Progress Notes (Signed)
Subjective:    Patient ID: [name], male    DOB: [birth year], 32 y.o.   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   Here with friend [name] Last here August 09, 2010 Currently says breathing is pretty good. Had temp 100 degrees x 1 week, sore throat, tender left cervical node. Had dieted and lost 40 lbs since off prednisone x 6 months. Now on 6-mercaptopurine (which drops WBC to 3000) and Remicade. These drugs are managed by Dr Sherrill/ Heme Onc and Dr Schooler/ GI. Denies wheeze, phlegm. Took oxycodone this AM for pains. For the past year has had some dry cough. Concerned about this as an early fibrosis marker. Wants PFT every 6 months. He plans to go( unscheduled so far) for CXR and BAL at the [facility] so he asks not to do a cxr here so as to minimize radiation exposure.    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   Since last here he had a total colectomy in 2012. Had ER visit at [facility] 03/24/2011 and CT scan suggested "something in lung" which he wanted to review. Treated with Levaquin. Has also had ER visit for kidney stone. Has stage III renal disease with 20% function-not known why. CXR 11/26/11- he reports "much clearer". Images were available on discs he brought from [facility]. We reviewed the images together. It looks as if he was dealing with a pneumonia in October with some residual density-pneumonia versus atelectasis-on the recent chest x-ray. He has minimal cough and no acute chest symptoms. Some of his interstitial markings may have been a little prominent but there was no dramatic interstitial fibrosis.  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 ACUTE VISIT: about 1 week or so ago started having cough, congestion, and had stomach flu over the weekend; noticed increase in mucus-started abx yesterday and has reduced the mucus amount. Major concern was breathing issues-felt unable to get breath and go any further. Noiced wheezing as well.We had called in augmentin yesterday to start because of his concern he was getting pneumonia. Describes 2 weeks of increased chest congestion and chest tightness but very productive cough. GI viral gastroenteritis pattern over this weekend seems to be a different problem. We had called in Augmentin which further aggravated loose stools through his colostomy. He is not on a routine probiotic. Today feeling better. Denies fever or sore throat.  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 ACUTE VISIT: SOB increased and yellow productive cough, labored breathing when talking,etc. Feelings of drowning and passing out; was given Doxycycline and Cipro We had stopped Augmentin at last visit because of watery diarrhea attributed to the antibiotic, although it may have been a separate viral gastroenteritis. He now says he chose to continue the Augmentin. We were closed first no last week he called the on-call doctor to get prescriptions for doxycycline and Cipro. Prednisone already being taken for his colitis was increased to 40 mg daily for wheezing. Cough was very productive. Today he asks he worked in, reporting more shortness of breath in the last few days "scary". Using rescue inhaler and resumed Symbicort for  modest benefit. He notices some wheezing lying down but not when he is upright. Arthralgias in   knees, no rash, fever or purulent sputum. He had past history of DVT complicating his colectomy by report. CXR 07/19/12- IMPRESSION:  Central peribronchial thickening, probably indicative of  bronchitis. No frank edema or consolidation.  Original Report Authenticated By: Bretta Bang, M.D.  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 FOLLOWS FOR: was told had blood clot and currently on coumadin now; would like to discuss coming off of it if possible.  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 Lovenox/warfarin lead to rectal bleeding-now on warfarin alone. Followed by hematologist at [facility]. Question this is an old clot -he did have DVT after previous surgery- but probably new/recurrent. No hemoptysis. He feels pneumonia resolved. Chest feels well. Home health nurse following INR. CXR 08/14/12 IMPRESSION:  Persistent patchy opacities in the left upper lobe and right base  dating back to 07/16/2012. Findings likely reflect residual changes  of pneumonia or potentially chronic underlying scarring/fibrosis.  Recommend additional repeat chest x-ray in 2 - 4 weeks. If there  is still no interval change, further evaluation with CT scan of the  chest would likely be warranted to further evaluate.  Original Report Authenticated By: Malachy Moan, M.D.  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 ACUTE VISIT: having trouble breathing x 2 weeks(happens with minor exertion) denies any wheezing, cough, or congestion. He started Zyrtec 2 weeks ago. Not using Symbicort "just too many meds" and not wheezing. He notes easy dyspnea on exertion  across a room or a telephone. Denies phlegm or cough, fever, sweats or chills. Feet are not swelling. We discussed anemia given his history of GI bleed. CXR 09/04/12 IMPRESSION:  Persistent streaky airspace/infiltrates bilateral upper and lower  lobe with central distribution. The findings probable due to  persistent pneumonia superimposed on chronic interstitial lung  disease. Slight worsening in aeration right upper lobe. Follow-up  to resolution is recommended. Further evaluation with CT of the  chest could be performed as clinically warranted.  Original Report Authenticated By: Natasha Mead, M.D.  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 Father and wife are here. Follow up to discuss test results.  States o2 sats are staying 88-90% RA during the day. He had an apparent pneumonia in February. Shortness of breath improved with antibiotics been diagnosed with recurrent DVT/PE which is followed by hematology at [facility] with plan to treat with anticoagulation for 3 months. He had a GI bleed. INR has been labile. Now on iron for anemia. In the last few weeks he has felt more short of breath especially with exertion, breathless with speech on room air. Not much effect from his inhalers. He gave disc of his CT scan to pulmonary at [facility]- they have expert on his syndrome. Using the elliptical trainer for exercise, 30 minutes is getting too hard and he has reduced to 10 minute sessions. ONOX- results pending PFT-10/04/2012-mild obstructive airways disease with response to bronchodilator, borderline mild air trapping with increased RV, diffusion severely reduced. FVC 4.30/74%, FEV1 3.45/77%, FEV1/FVC 0.80. TLC 92%, RV 121%, DLCO 40%. CT 4/24 /14  IMPRESSION:  Slightly upper lobe predominant  peribronchovascular fibrosis, as   detailed above. This is new since 07/28/2008.  No evidence of acute superimposed process.  Mild thoracic adenopathy, favored to be reactive.  Original Report Authenticated By: Jeronimo Greaves, M.D.  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 Father and wife are here. FOLLOWS FOR: Feels that his breathing has gotten worse. He was exposed to someone who later developed pneumonia P. Reports DOE while on 3L and minor congestion. Denies chest pain or wheezing. Acute Visit, mostly wanting consultation. Leshaun is noticing a decline in oxygen saturation with easier dyspnea on exertion. Sometimes saturation dips to 88% on 3 L and his heartbeat seems rapid. He has been on clobetasol for active Crohn's disease. Currently off Dapsone and 6 MCP. He remains on warfarin after recurrent DVT.    ROS-see HPI Constitutional:   No-   weight loss, night sweats, fevers, chills,  sore throat,       No-  sneezing, itching, ear ache, nasal congestion, post nasal drip,  CV:  No-   chest pain, orthopnea, PND, swelling in lower extremities, anasarca, dizziness, palpitations Resp: + shortness of breath with exertion or at rest.              No- productive cough,  No- non-productive cough,  No- coughing up of blood.              No-  change in color of mucus.  No- wheezing.   Skin: No-   rash or lesions. GI:  No-   heartburn, indigestion, abdominal pain, no- nausea, vomiting,  GU:  MS:  No-   joint pain or swelling.   Neuro-     nothing unusual Psych:  + change in mood or affect. + depression or anxiety.  No memory loss.  OBJ- Physical Exam BP 118/86  Pulse 120  Ht 6\' 1"  (1.854 m)  Wt 241 lb (109.317 kg)  BMI 31.8 kg/m2  SpO2 96%  General- Alert, Oriented, Affect-calm today, Distress- none acute, albino, Room Air Skin- +Albino. +some  ecchymoses on his arms Lymphadenopathy- none Head- atraumatic            Eyes-  PERRLA, conjunctivae and secretions clear. +Strabismus, +nystagmus            Ears- Hearing, canals-normal            Nose- Clear, no-Septal dev, mucus, polyps, erosion, perforation             Throat- Mallampati II , mucosa clear , drainage- none, tonsils- atrophic Neck- flexible , trachea midline, no stridor , thyroid nl, carotid no bruit Chest - symmetrical excursion , unlabored           Heart/CV- RRR , no murmur , no gallop  , no rub, nl s1 s2                           - JVD- none , edema-none, stasis changes- none, varices- none           Lung- + trace crackles heard, wheeze- none, cough+light , dullness-none, rub- none.            Chest wall-  Abd- +colostomy Gen/ Rectal- Not done, not indicated Extrem- cyanosis- none, clubbing, none, atrophy- none, strength- nl.  Neuro- grossly intact to observation except nystagmus

## 2012-10-19 LAB — D-DIMER, QUANTITATIVE: D-Dimer, Quant: 2.04 ug/mL-FEU — ABNORMAL HIGH (ref 0.00–0.48)

## 2012-10-21 ENCOUNTER — Telehealth: Payer: Self-pay | Admitting: Internal Medicine

## 2012-10-21 NOTE — Telephone Encounter (Signed)
[facility] Triage Call Report Triage Record Num: [id number] Operator: Hyman Bower Patient Name: [name] Call Date & Time: 10/19/2012 6:06:35PM Patient Phone: [phone] PCP: Sanda Linger Patient Gender: Male PCP Fax : Patient DOB: 11-Nov-1980 Practice Name: [facility] Reason for Call: Caller: Lenoard Aden; PCP: Jetty Duhamel, MD; CB#: [phone]; Reporting D-Dimer, drawn 10/18/12 at 4:40pm, result: 2.04 (0.0-0.48) this is an alert high, not a critical level. Reviewed EPIC chart. Patient was seen for increasing shortness of breath/chronic respiratory failure/pulmonary fibrosis on 10/18/12 by Dr. Maple Hudson. Labs were ordered and patient was started on Prednisone 10mg , with directions to take 4 tablets po x 2 days, 3 tablets po x 2 days, 2 tablets po x 2 days, 1 tablet po x 2 days and then stop. Called and left voicemail for Dr. Caryl Never (on call MD) to report lab result. He reports we are to call the pulmonary division of [place] to report this level. Spoke with Dr. Herma Carson regarding D-Dimer level results. Results reported. Protocol(s) Used: Office Note Recommended Outcome per Protocol: Information Noted and Sent to Office Reason for Outcome: Caller information to office

## 2012-10-21 NOTE — Progress Notes (Signed)
Quick Note:  LMTCB ______ 

## 2012-10-24 ENCOUNTER — Ambulatory Visit (INDEPENDENT_AMBULATORY_CARE_PROVIDER_SITE_OTHER): Payer: 59 | Admitting: General Practice

## 2012-10-24 DIAGNOSIS — E70331 Hermansky-Pudlak syndrome: Secondary | ICD-10-CM

## 2012-10-24 DIAGNOSIS — J8410 Pulmonary fibrosis, unspecified: Secondary | ICD-10-CM

## 2012-10-24 DIAGNOSIS — Z7901 Long term (current) use of anticoagulants: Secondary | ICD-10-CM

## 2012-10-25 ENCOUNTER — Encounter: Payer: Self-pay | Admitting: Internal Medicine

## 2012-10-25 NOTE — Progress Notes (Signed)
Quick Note:  LMTCB ______ 

## 2012-10-25 NOTE — Progress Notes (Signed)
Quick Note:  Spoke with patient-aware of results. Will discuss with his hematologist. ______

## 2012-10-30 ENCOUNTER — Telehealth: Payer: Self-pay | Admitting: Internal Medicine

## 2012-10-30 NOTE — Assessment & Plan Note (Addendum)
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 I don't think he has an acute infection, but we will watch for this. He remains anticoagulated. I doubt interval pulmonary embolism. We have discussed higher dose steroid trial. Consider bronchoscopy, but he would have to come off of anticoagulation. We have discussed Perfenadone. It did not help in an HPS study, but I will submit his name for consideration by our study group participating in an open label safety study. Plan-D. Dimer, chest x-ray, CBC, sedimentation rate

## 2012-10-30 NOTE — Telephone Encounter (Signed)
Kaite/ Dr. Maple Hudson have you guys heard anything in regard to study. Please advise thanks

## 2012-10-30 NOTE — Telephone Encounter (Signed)
I spoke with pt and is aware. He voiced his understanding and needed nothing further 

## 2012-10-30 NOTE — Telephone Encounter (Signed)
Per CY-I have given his name to study group. They are only looking at standard pulmonary fibrosis patients, so I don't know if they can include him. Also, please inform patient that he may not always be able to speak directly with CY's nurse and any of our nursing staff can assist with any questions or concerns he may have.

## 2012-10-31 ENCOUNTER — Ambulatory Visit (INDEPENDENT_AMBULATORY_CARE_PROVIDER_SITE_OTHER): Payer: Self-pay | Admitting: General Practice

## 2012-10-31 DIAGNOSIS — Z7901 Long term (current) use of anticoagulants: Secondary | ICD-10-CM

## 2012-10-31 DIAGNOSIS — E70331 Hermansky-Pudlak syndrome: Secondary | ICD-10-CM

## 2012-10-31 DIAGNOSIS — J8410 Pulmonary fibrosis, unspecified: Secondary | ICD-10-CM

## 2012-11-01 ENCOUNTER — Telehealth: Payer: Self-pay

## 2012-11-01 NOTE — Telephone Encounter (Signed)
LMOVM given verbal ok per MD

## 2012-11-01 NOTE — Telephone Encounter (Signed)
ok 

## 2012-11-01 NOTE — Telephone Encounter (Signed)
Message copied by Sandi Mealy on Fri Nov 01, 2012 12:02 PM ------      Message from: Lodema Pilot D      Created: Fri Nov 01, 2012 11:32 AM      Regarding: Extended visits for patient.       Sherre Poot, RN @ [facility] needs a VO to extend visits for 4 weeks.  Her number is [phone].              Thanks!      Cindy ------

## 2012-11-01 NOTE — Telephone Encounter (Signed)
Please advise if ok for Verbal. Thanks

## 2012-11-05 ENCOUNTER — Telehealth: Payer: Self-pay | Admitting: Internal Medicine

## 2012-11-05 NOTE — Telephone Encounter (Signed)
LMTCB

## 2012-11-06 NOTE — Telephone Encounter (Signed)
Offer Zpak 

## 2012-11-06 NOTE — Telephone Encounter (Signed)
Spoke with the pt and notified of recs per CDY He states that this cough with yellow, blood tinged sputum was not described at the last visit  He states that his current cough is not related to his PF, but it is related to a cold He states that his family has been sick with cold and needs abx Please advise thanks! Allergies  Allergen Reactions  . Metronidazole     REACTION: neuropathy

## 2012-11-06 NOTE — Telephone Encounter (Signed)
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 For the cough and let yellow sputum with a bit of blood- that has been what he has described recently in office. Let us know if it gets worse. I don't think an antibiotic would help right now.

## 2012-11-06 NOTE — Telephone Encounter (Signed)
LMTCB

## 2012-11-06 NOTE — Telephone Encounter (Signed)
Pt states he is returning our call and asked to be called @ [phone] and to leave a detailed message. Leanora Ivanoff

## 2012-11-06 NOTE — Telephone Encounter (Signed)
I spoke with the pt and he has 2 questions: 1) he is going to try to get into a study and he needed to know his exact diagnosis, pulmonary fibrosis, IPF, etc. I advised according to problem list it is pulmonary fibrosis ICD-9 515. Pt wanted to make sure this is the correct diagnosis or if there was a more specific diagnosis that could be given?  2) Also over the last week the pt states he has been having a productive cough with yellow and blood tinged phlegm. He feels the blood is mainly due to dry nose from oxygen. He also c/o increased SOB. Pt states there have been other sick people in his house this week as well. Please advise. Carron Curie, CMA Allergies  Allergen Reactions  . Metronidazole     REACTION: neuropathy

## 2012-11-07 MED ORDER — AZITHROMYCIN 250 MG PO TABS: ORAL_TABLET | ORAL | Status: DC

## 2012-11-07 NOTE — Telephone Encounter (Signed)
rx sent. Pt is aware. Jennifer Castillo, CMA  

## 2012-11-11 ENCOUNTER — Ambulatory Visit: Payer: 59 | Admitting: Internal Medicine

## 2012-11-11 ENCOUNTER — Encounter: Payer: Self-pay | Admitting: Internal Medicine

## 2012-11-11 ENCOUNTER — Ambulatory Visit (INDEPENDENT_AMBULATORY_CARE_PROVIDER_SITE_OTHER): Payer: 59 | Admitting: Internal Medicine

## 2012-11-11 VITALS — BP 132/76 | HR 112 | Ht 72.0 in | Wt 244.0 lb

## 2012-11-11 DIAGNOSIS — J8410 Pulmonary fibrosis, unspecified: Secondary | ICD-10-CM

## 2012-11-11 MED ORDER — AZITHROMYCIN 250 MG PO TABS: ORAL_TABLET | ORAL | Status: DC

## 2012-11-11 NOTE — Patient Instructions (Addendum)
Order- second opinion referral to Dr Marchelle Gearing  For pulmonary fibrosis/ perfenadone consideration  Script to hold for Zpak antibiotic  [facility] about [facility] consideration.

## 2012-11-11 NOTE — Progress Notes (Signed)
Subjective:    Patient ID: [name], male    DOB: 1980-12-12, 32 y.o.   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   Here with friend [name] Last here August 09, 2010 Currently says breathing is pretty good. Had temp 100 degrees x 1 week, sore throat, tender left cervical node. Had dieted and lost 40 lbs since off prednisone x 6 months. Now on 6-mercaptopurine (which drops WBC to 3000) and Remicade. These drugs are managed by Dr Sherrill/ Heme Onc and Dr Schooler/ GI. Denies wheeze, phlegm. Took oxycodone this AM for pains. For the past year has had some dry cough. Concerned about this as an early fibrosis marker. Wants PFT every 6 months. He plans to go( unscheduled so far) for CXR and BAL at the [facility] so he asks not to do a cxr here so as to minimize radiation exposure.    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   Since last here he had a total colectomy in 2012. Had ER visit at [facility] 03/24/2011 and CT scan suggested "something in lung" which he wanted to review. Treated with Levaquin. Has also had ER visit for kidney stone. Has stage III renal disease with 20% function-not known why. CXR 11/26/11- he reports "much clearer". Images were available on discs he brought from [facility] We reviewed the images together. It looks as if he was dealing with a pneumonia in October with some residual density-pneumonia versus atelectasis-on the recent chest x-ray. He has minimal cough and no acute chest symptoms. Some of his interstitial markings may have been a little prominent but there was no dramatic interstitial fibrosis.  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 ACUTE VISIT: about 1 week or so ago started having cough, congestion, and had stomach flu over the weekend; noticed increase in mucus-started abx yesterday and has reduced the mucus amount. Major concern was breathing issues-felt unable to get breath and go any further. Noiced wheezing as well.We had called in augmentin yesterday to start because of his concern he was getting pneumonia. Describes 2 weeks of increased chest congestion and chest tightness but very productive cough. GI viral gastroenteritis pattern over this weekend seems to be a different problem. We had called in Augmentin which further aggravated loose stools through his colostomy. He is not on a routine probiotic. Today feeling better. Denies fever or sore throat.  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 ACUTE VISIT: SOB increased and yellow productive cough, labored breathing when talking,etc. Feelings of drowning and passing out; was given Doxycycline and Cipro We had stopped Augmentin at last visit because of watery diarrhea attributed to the antibiotic, although it may have been a separate viral gastroenteritis. He now says he chose to continue the Augmentin. We were closed first no last week he called the on-call doctor to get prescriptions for doxycycline and Cipro. Prednisone already being taken for his colitis was increased to 40 mg daily for wheezing. Cough was very productive. Today he asks he worked in, reporting more shortness of breath in the last few days "scary". Using rescue inhaler and resumed Symbicort for  modest benefit. He notices some wheezing lying down but not when he is upright. Arthralgias in   knees, no rash, fever or purulent sputum. He had past history of DVT complicating his colectomy by report. CXR 07/19/12- IMPRESSION:  Central peribronchial thickening, probably indicative of  bronchitis. No frank edema or consolidation.  Original Report Authenticated By: Bretta Bang, M.D.  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 FOLLOWS FOR: was told had blood clot and currently on coumadin now; would like to discuss coming off of it if possible.  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 Lovenox/warfarin lead to rectal bleeding-now on warfarin alone. Followed by hematologist at [facility]. Question this is an old clot -he did have DVT after previous surgery- but probably new/recurrent. No hemoptysis. He feels pneumonia resolved. Chest feels well. Home health nurse following INR. CXR 08/14/12 IMPRESSION:  Persistent patchy opacities in the left upper lobe and right base  dating back to 07/16/2012. Findings likely reflect residual changes  of pneumonia or potentially chronic underlying scarring/fibrosis.  Recommend additional repeat chest x-ray in 2 - 4 weeks. If there  is still no interval change, further evaluation with CT scan of the  chest would likely be warranted to further evaluate.  Original Report Authenticated By: Malachy Moan, M.D.  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 ACUTE VISIT: having trouble breathing x 2 weeks(happens with minor exertion) denies any wheezing, cough, or congestion. He started Zyrtec 2 weeks ago. Not using Symbicort "just too many meds" and not wheezing. He notes easy dyspnea on exertion  across a room or a telephone. Denies phlegm or cough, fever, sweats or chills. Feet are not swelling. We discussed anemia given his history of GI bleed. CXR 09/04/12 IMPRESSION:  Persistent streaky airspace/infiltrates bilateral upper and lower  lobe with central distribution. The findings probable due to  persistent pneumonia superimposed on chronic interstitial lung  disease. Slight worsening in aeration right upper lobe. Follow-up  to resolution is recommended. Further evaluation with CT of the  chest could be performed as clinically warranted.  Original Report Authenticated By: Natasha Mead, M.D.  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 Father and wife are here. Follow up to discuss test results.  States o2 sats are staying 88-90% RA during the day. He had an apparent pneumonia in February. Shortness of breath improved with antibiotics been diagnosed with recurrent DVT/PE which is followed by hematology at [facility] with plan to treat with anticoagulation for 3 months. He had a GI bleed. INR has been labile. Now on iron for anemia. In the last few weeks he has felt more short of breath especially with exertion, breathless with speech on room air. Not much effect from his inhalers. He gave disc of his CT scan to pulmonary at [facility]- they have expert on his syndrome. Using the elliptical trainer for exercise, 30 minutes is getting too hard and he has reduced to 10 minute sessions. ONOX- results pending PFT-10/04/2012-mild obstructive airways disease with response to bronchodilator, borderline mild air trapping with increased RV, diffusion severely reduced. FVC 4.30/74%, FEV1 3.45/77%, FEV1/FVC 0.80. TLC 92%, RV 121%, DLCO 40%. CT 4/24 /14  IMPRESSION:  Slightly upper lobe predominant  peribronchovascular fibrosis, as   detailed above. This is new since 07/28/2008.  No evidence of acute superimposed process.  Mild thoracic adenopathy, favored to be reactive.  Original Report Authenticated By: Jeronimo Greaves, M.D.  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 Father and wife are here. FOLLOWS FOR: Feels that his breathing has gotten worse. He was exposed to someone who later developed pneumonia P. Reports DOE while on 3L and minor congestion. Denies chest pain or wheezing. Acute Visit, mostly wanting consultation. Lewayne is noticing a decline in oxygen saturation with easier dyspnea on exertion. Sometimes saturation dips to 88% on 3 L and his heartbeat seems rapid. He has been on clobetasol for active Crohn's disease. Currently off Dapsone and 6 MCP. He remains on warfarin after recurrent DVT.  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   Father here FOLLOWS FOR: pt states chest congestion has not improved in last 1 week-- would like to discuss perfinidone options-- would also like to discuss lung tx options--should pt stay away from daughter who will get vaccinations tomorrow On Zithromax. Feeling better. Mucinex and water helps clear mucus. Family is passing around cold. He remains on and clobetasol, but has finished prednisone taper. Wife has had strep throat. CXR 10/18/12-  IMPRESSION:  Stable chronic interstitial lung disease/fibrosis.  Original Report Authenticated By: Judie Petit. Shick, M.D.  ROS-see HPI Constitutional:   No-   weight loss, night sweats, fevers, chills,  sore throat,       No-  sneezing, itching, ear ache, nasal congestion, post nasal drip,  CV:   No-   chest pain, orthopnea, PND, swelling in lower extremities, anasarca, dizziness, palpitations Resp: + shortness of breath with exertion or at rest.              + productive cough,  No- non-productive cough,  No- coughing up of blood.              + change in color of mucus.  No- wheezing.   Skin: No-   rash or lesions. GI:  No-   heartburn, indigestion, abdominal pain, no- nausea, vomiting,  GU:  MS:  No-   joint pain or swelling.   Neuro-     nothing unusual Psych:  + change in mood or affect. + depression or anxiety.  No memory loss.  OBJ- Physical Exam .BP 132/76  Pulse 112  Ht 6' (1.829 m)  Wt 244 lb (110.678 kg)  BMI 33.09 kg/m2  SpO2 94% General- Alert, Oriented, Affect-calm today, Distress- none acute, albino, O2 2.5Lshe Skin- +Albino. +some ecchymoses on his arms Lymphadenopathy- none Head- atraumatic            Eyes-  PERRLA, conjunctivae and secretions clear. +Strabismus, +nystagmus            Ears- Hearing, canals-normal            Nose- Clear, no-Septal dev, mucus, polyps, erosion, perforation             Throat- Mallampati II , mucosa clear , drainage- none, tonsils- atrophic Neck- flexible , trachea midline, no stridor , thyroid nl, carotid no bruit Chest - symmetrical excursion , unlabored           Heart/CV- RRR , no murmur , no gallop  , no rub,  nl s1 s2                           - JVD- none , edema-none, stasis changes- none, varices- none           Lung- + trace crackles heard, wheeze- none, cough-none , dullness-none, rub- none.            Chest wall-  Abd- +colostomy Gen/ Rectal- Not done, not indicated Extrem- cyanosis- none, clubbing, none, atrophy- none, strength- nl.  Neuro- grossly intact to observation except nystagmus

## 2012-11-14 ENCOUNTER — Ambulatory Visit (INDEPENDENT_AMBULATORY_CARE_PROVIDER_SITE_OTHER): Payer: 59 | Admitting: General Practice

## 2012-11-14 DIAGNOSIS — N189 Chronic kidney disease, unspecified: Secondary | ICD-10-CM

## 2012-11-14 DIAGNOSIS — E70331 Hermansky-Pudlak syndrome: Secondary | ICD-10-CM

## 2012-11-14 DIAGNOSIS — J45909 Unspecified asthma, uncomplicated: Secondary | ICD-10-CM

## 2012-11-14 DIAGNOSIS — B356 Tinea cruris: Secondary | ICD-10-CM

## 2012-11-14 DIAGNOSIS — I82409 Acute embolism and thrombosis of unspecified deep veins of unspecified lower extremity: Secondary | ICD-10-CM

## 2012-11-14 DIAGNOSIS — Z7901 Long term (current) use of anticoagulants: Secondary | ICD-10-CM

## 2012-11-14 DIAGNOSIS — J8410 Pulmonary fibrosis, unspecified: Secondary | ICD-10-CM

## 2012-11-14 LAB — POCT INR: INR: 1.5

## 2012-11-21 NOTE — Assessment & Plan Note (Signed)
We again discussed his interstitial fibrotic pattern imaging and the question of candidacy for perfenidone trial  I suggested referral to talk directly to dr Marchelle Gearing about this.  He also wants transplant evaluation will pass this information forward to [facility] and [facility]  He asks to hold a prescription for antibiotics, which we discussed.

## 2012-11-22 ENCOUNTER — Ambulatory Visit (INDEPENDENT_AMBULATORY_CARE_PROVIDER_SITE_OTHER): Payer: 59 | Admitting: Family Medicine

## 2012-11-22 DIAGNOSIS — Z7901 Long term (current) use of anticoagulants: Secondary | ICD-10-CM

## 2012-11-22 DIAGNOSIS — E70331 Hermansky-Pudlak syndrome: Secondary | ICD-10-CM

## 2012-11-22 DIAGNOSIS — J8410 Pulmonary fibrosis, unspecified: Secondary | ICD-10-CM

## 2012-11-28 ENCOUNTER — Telehealth: Payer: Self-pay

## 2012-11-28 ENCOUNTER — Ambulatory Visit (INDEPENDENT_AMBULATORY_CARE_PROVIDER_SITE_OTHER): Payer: 59 | Admitting: General Practice

## 2012-11-28 DIAGNOSIS — Z7901 Long term (current) use of anticoagulants: Secondary | ICD-10-CM

## 2012-11-28 DIAGNOSIS — E70331 Hermansky-Pudlak syndrome: Secondary | ICD-10-CM

## 2012-11-28 DIAGNOSIS — J8410 Pulmonary fibrosis, unspecified: Secondary | ICD-10-CM

## 2012-11-28 NOTE — Telephone Encounter (Signed)
Phone call from [name] at [facility] [phone]. She needs new order for home health re-certification since Dr Yetta Barre is out of the office until Monday.

## 2012-11-28 NOTE — Telephone Encounter (Signed)
HHRN informed 

## 2012-11-28 NOTE — Telephone Encounter (Signed)
Ok for verbal 

## 2012-11-29 ENCOUNTER — Telehealth: Payer: Self-pay | Admitting: Internal Medicine

## 2012-11-29 ENCOUNTER — Telehealth: Payer: Self-pay

## 2012-11-29 NOTE — Telephone Encounter (Signed)
Message copied by Sandi Mealy on Fri Nov 29, 2012  8:07 AM ------      Message from: Lodema Pilot D      Created: Thu Nov 28, 2012  3:40 PM       Sherre Poot, RN called about patient.  She needs new re-cert orders to faxed in to [facility].  If you have any questions her number is [phone].  She said that she couldn't take a verbal.      Thanks!              Cindy ------

## 2012-11-29 NOTE — Telephone Encounter (Signed)
Unsure what this means, generally they will fax over the required forms for MD to sign.

## 2012-11-29 NOTE — Telephone Encounter (Signed)
lmtcb x1 

## 2012-12-03 NOTE — Telephone Encounter (Signed)
lmomtcb x1 

## 2012-12-05 ENCOUNTER — Ambulatory Visit (INDEPENDENT_AMBULATORY_CARE_PROVIDER_SITE_OTHER): Payer: 59 | Admitting: Family Medicine

## 2012-12-05 DIAGNOSIS — J8410 Pulmonary fibrosis, unspecified: Secondary | ICD-10-CM

## 2012-12-05 DIAGNOSIS — Z7901 Long term (current) use of anticoagulants: Secondary | ICD-10-CM

## 2012-12-05 DIAGNOSIS — E70331 Hermansky-Pudlak syndrome: Secondary | ICD-10-CM

## 2012-12-05 NOTE — Telephone Encounter (Signed)
Spoke with patient,  Patient states he has been taking mucinex and the congestion seems to be resolving Patient states nothing further needed at this time.

## 2012-12-09 ENCOUNTER — Encounter: Payer: Self-pay | Admitting: Internal Medicine

## 2012-12-16 ENCOUNTER — Ambulatory Visit (INDEPENDENT_AMBULATORY_CARE_PROVIDER_SITE_OTHER): Payer: 59 | Admitting: General Practice

## 2012-12-16 DIAGNOSIS — E70331 Hermansky-Pudlak syndrome: Secondary | ICD-10-CM

## 2012-12-16 DIAGNOSIS — Z7901 Long term (current) use of anticoagulants: Secondary | ICD-10-CM

## 2012-12-16 DIAGNOSIS — J8410 Pulmonary fibrosis, unspecified: Secondary | ICD-10-CM

## 2012-12-16 LAB — POCT INR: INR: 2.7

## 2012-12-17 ENCOUNTER — Encounter: Payer: Self-pay | Admitting: Internal Medicine

## 2012-12-17 ENCOUNTER — Ambulatory Visit (INDEPENDENT_AMBULATORY_CARE_PROVIDER_SITE_OTHER): Payer: 59 | Admitting: Internal Medicine

## 2012-12-17 VITALS — BP 130/90 | HR 112 | Temp 98.0°F | Ht 73.0 in | Wt 250.6 lb

## 2012-12-17 DIAGNOSIS — J8410 Pulmonary fibrosis, unspecified: Secondary | ICD-10-CM

## 2012-12-17 DIAGNOSIS — J849 Interstitial pulmonary disease, unspecified: Secondary | ICD-10-CM

## 2012-12-17 MED ORDER — BENZONATATE 200 MG PO CAPS: 200.0000 mg | ORAL_CAPSULE | Freq: Three times a day (TID) | ORAL | Status: DC | PRN

## 2012-12-17 NOTE — Progress Notes (Signed)
Subjective:    Patient ID: [name], male    DOB: 02/16/1981, 32 y.o.   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   Here with friend [name] Last here August 09, 2010 Currently says breathing is pretty good. Had temp 100 degrees x 1 week, sore throat, tender left cervical node. Had dieted and lost 40 lbs since off prednisone x 6 months. Now on 6-mercaptopurine (which drops WBC to 3000) and Remicade. These drugs are managed by Dr Sherrill/ Heme Onc and Dr Schooler/ GI. Denies wheeze, phlegm. Took oxycodone this AM for pains. For the past year has had some dry cough. Concerned about this as an early fibrosis marker. Wants PFT every 6 months. He plans to go( unscheduled so far) [facility] so he asks not to do a cxr here so as to minimize radiation exposure.    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   Since last here he had a total colectomy in 2012. Had ER visit at [facility] 03/24/2011 and CT scan suggested "something in lung" which he wanted to review. Treated with Levaquin. Has also had ER visit for kidney stone. Has stage III renal disease with 20% function-not known why. CXR 11/26/11- he reports "much clearer". Images were available on discs he brought from [facility]. We reviewed the images together. It looks as if he was dealing with a pneumonia in October with some residual density-pneumonia versus atelectasis-on the recent chest x-ray. He has minimal cough and no acute chest symptoms. Some of his interstitial markings may have been a little prominent but there was no dramatic interstitial fibrosis.  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 ACUTE VISIT: about 1 week or so ago started having cough, congestion, and had stomach flu over the weekend; noticed increase in mucus-started abx yesterday and has reduced the mucus amount. Major concern was breathing issues-felt unable to get breath and go any further. Noiced wheezing as well.We had called in augmentin yesterday to start because of his concern he was getting pneumonia. Describes 2 weeks of increased chest congestion and chest tightness but very productive cough. GI viral gastroenteritis pattern over this weekend seems to be a different problem. We had called in Augmentin which further aggravated loose stools through his colostomy. He is not on a routine probiotic. Today feeling better. Denies fever or sore throat.  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 ACUTE VISIT: SOB increased and yellow productive cough, labored breathing when talking,etc. Feelings of drowning and passing out; was given Doxycycline and Cipro We had stopped Augmentin at last visit because of watery diarrhea attributed to the antibiotic, although it may have been a separate viral gastroenteritis. He now says he chose to continue the Augmentin. We were closed first no last week he called the on-call doctor to get prescriptions for doxycycline and Cipro. Prednisone already being taken for his colitis was increased to 40 mg daily for wheezing. Cough was very productive. Today he asks he worked in, reporting more shortness of breath in the last few days "scary". Using rescue inhaler and resumed Symbicort  for modest benefit. He notices some wheezing lying down but not when he is upright. Arthralgias in   knees, no rash, fever or purulent sputum. He had past history of DVT complicating his colectomy by report. CXR 07/19/12- IMPRESSION:  Central peribronchial thickening, probably indicative of  bronchitis. No frank edema or consolidation.  Original Report Authenticated By: Bretta Bang, M.D.  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 FOLLOWS FOR: was told had blood clot and currently on coumadin now; would like to discuss coming off of it if possible.  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 Lovenox/warfarin lead to rectal bleeding-now on warfarin alone. Followed by hematologist at [facility]. Question this is an old clot -he did have DVT after previous surgery- but probably new/recurrent. No hemoptysis. He feels pneumonia resolved. Chest feels well. Home health nurse following INR. CXR 08/14/12 IMPRESSION:  Persistent patchy opacities in the left upper lobe and right base  dating back to 07/16/2012. Findings likely reflect residual changes  of pneumonia or potentially chronic underlying scarring/fibrosis.  Recommend additional repeat chest x-ray in 2 - 4 weeks. If there  is still no interval change, further evaluation with CT scan of the  chest would likely be warranted to further evaluate.  Original Report Authenticated By: Malachy Moan, M.D.  09/23/12- 61 yoM never smoker followed for asthma, allergic rhinitis, recurrent respiratory infections, Inflammatory bowel disease/ colostomy (Hermansky-Pudlak Syndrome- albinism, colitis, pulmonary fibrosis).Recurrent DVT. ACUTE VISIT: having trouble breathing x 2 weeks(happens with minor exertion) denies any wheezing, cough, or congestion. He started Zyrtec 2 weeks ago. Not using Symbicort "just too many meds" and not wheezing. He notes easy dyspnea on exertion  across a room or a telephone. Denies phlegm or cough, fever, sweats or chills. Feet are not swelling. We discussed anemia given his history of GI bleed. CXR 09/04/12 IMPRESSION:  Persistent streaky airspace/infiltrates bilateral upper and lower  lobe with central distribution. The findings probable due to  persistent pneumonia superimposed on chronic interstitial lung  disease. Slight worsening in aeration right upper lobe. Follow-up  to resolution is recommended. Further evaluation with CT of the  chest could be performed as clinically warranted.  Original Report Authenticated By: Natasha Mead, M.D.  10/08/12- 54 yoM never smoker followed for asthma, allergic rhinitis, recurrent respiratory infections, Inflammatory bowel disease/ colostomy (Hermansky-Pudlak Syndrome- albinism, colitis, pulmonary fibrosis).Recurrent DVT. Father and wife are here. Follow up to discuss test results.  States o2 sats are staying 88-90% RA during the day. He had an apparent pneumonia in February. Shortness of breath improved with antibiotics been diagnosed with recurrent DVT/PE which is followed by hematology at [facility] with plan to treat with anticoagulation for 3 months. He had a GI bleed. INR has been labile. Now on iron for anemia. In the last few weeks he has felt more short of breath especially with exertion, breathless with speech on room air. Not much effect from his inhalers. He gave disc of his [facility] to [facility]- they have expert on his syndrome. Using the elliptical trainer for exercise, 30 minutes is getting too hard and he has reduced to 10 minute sessions. ONOX- results pending PFT-10/04/2012-mild obstructive airways disease with response to bronchodilator, borderline mild air trapping with increased RV, diffusion severely reduced. FVC 4.30/74%, FEV1 3.45/77%, FEV1/FVC 0.80. TLC 92%, RV 121%, DLCO 40%. CT 4/24 /14  IMPRESSION:  Slightly upper lobe  predominant peribronchovascular fibrosis, as   detailed above. This is new since 07/28/2008.  No evidence of acute superimposed process.  Mild thoracic adenopathy, favored to be reactive.  Original Report Authenticated By: Jeronimo Greaves, M.D.  10/18/12- 66 yoM never smoker followed for asthma, allergic rhinitis, recurrent respiratory infections, Inflammatory bowel disease/ colostomy (Hermansky-Pudlak Syndrome- albinism, colitis, pulmonary fibrosis).Recurrent DVT. Father and wife are here. FOLLOWS FOR: Feels that his breathing has gotten worse. He was exposed to someone who later developed pneumonia P. Reports DOE while on 3L and minor congestion. Denies chest pain or wheezing. Acute Visit, mostly wanting consultation. [place] is noticing a decline in oxygen saturation with easier dyspnea on exertion. Sometimes saturation dips to 88% on 3 L and his heartbeat seems rapid. He has been on clobetasol for active Crohn's disease. Currently off Dapsone and 6 MCP. He remains on warfarin after recurrent DVT.  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   Father here FOLLOWS FOR: pt states chest congestion has not improved in last 1 week-- would like to discuss perfinidone options-- would also like to discuss lung tx options--should pt stay away from daughter who will get vaccinations tomorrow On Zithromax. Feeling better. Mucinex and water helps clear mucus. Family is passing around cold. He remains on and clobetasol, but has finished prednisone taper. Wife has had strep throat. CXR 10/18/12-  IMPRESSION:  Stable chronic interstitial lung disease/fibrosis.  Original Report Authenticated By: Judie Petit. Miles Costain, M.D.   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 He is here with his dad. I have been asked by Dr Fannie Knee my colleague to comment on eligibiity for  Pirfenidone on the Expanded Access Program for IPF patients  - PRAYAN ULIN says around feb 2014 admitted for "pneumonia" but thisnever cleared. 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 He tells me that his "pulmonary fibrosis" is "radpidly progressive" .Initially was on 2L [place] but Now sitting using 3-4L o2 and 6L with exertion. He has since [facility] with [facility] Tanna Furry who is an HPS expert and says has had a followup CT (not avialable for my review) and apparently this shows worsening. He is planning for lung transplant; referral made per DR Maple Hudson. However, he has multiple medical problems including colostomy and prefers to delay lung transplant as much as possible. To this extent he is VERY KEEN on getting Pirfenidone. HE showed me a 2002 article which showed pifenidone worked in HPS for patients with FVC > 50% and a 2012 article showing it did not work (both RCT blinded). In both his [facility] pulmonologist is an Chartered loss adjuster.  HE seems to know intriicacies of the paper. He disagrees with negative results of the 2012 paper. He is planning to get pirfenidone from [place] if he cannot get it through EAP program for IPF. He has questions on pirfenidone as well  - Of note, he has not had surgical lung biopsy or BAL of lung for his ILD evaluation  Review of Systems  Constitutional: Negative for fever and unexpected weight change.  HENT: Negative for ear pain, nosebleeds, congestion, sore throat, rhinorrhea, sneezing, trouble swallowing, dental problem, postnasal drip and sinus pressure.   Eyes: Negative for redness and itching.  Respiratory: Negative  for cough, chest tightness, shortness of breath and wheezing.   Cardiovascular: Negative for palpitations and leg swelling.  Gastrointestinal: Negative for nausea and vomiting.  Genitourinary: Negative for dysuria.   Musculoskeletal: Negative for joint swelling.  Skin: Negative for rash.  Neurological: Negative for headaches.  Hematological: Does not bruise/bleed easily.  Psychiatric/Behavioral: Negative for dysphoric mood. The patient is not nervous/anxious.        Objective:   Physical Exam . Filed Vitals:   12/17/12 1555  BP: 130/90  Pulse: 112  Temp: 98 F (36.7 C)  TempSrc: Oral  Height: 6\' 1"  (1.854 m)  Weight: 250 lb 9.6 oz (113.671 kg)  SpO2: 98%      General- Alert, Oriented, Affect-calm today, Distress- none acute, albino, O2 4L Lshe Skin- +Albino. +some ecchymoses on his arms Lymphadenopathy- none Head- atraumatic            Eyes-  PERRLA, conjunctivae and secretions clear. +Strabismus, +nystagmus            Ears- Hearing, canals-normal            Nose- Clear, no-Septal dev, mucus, polyps, erosion, perforation             Throat- Mallampati II , mucosa clear , drainage- none, tonsils- atrophic Neck- flexible , trachea midline, no stridor , thyroid nl, carotid no bruit Chest - symmetrical excursion , unlabored           Heart/CV- RRR , no murmur , no gallop  , no rub, nl s1 s2                           - JVD- none , edema-none, stasis changes- none, varices- none           Lung- + trace crackles heard, wheeze- none, cough-none , dullness-none, rub- none.            Chest wall-  Abd- +colostomy Gen/ Rectal- Not done, not indicated Extrem- cyanosis- none, clubbing, none, atrophy- none, strength- nl.  Neuro- grossly intact to observation except nystagmus        Assessment & Plan:

## 2012-12-17 NOTE — Patient Instructions (Addendum)
Regardin your ILD; CT pattern in April 2014 does not fit in with UIP. I do not know what it is. Something I will discuss with Dr Maple Hudson Regarding pirfenidone: you can get it from [place] on your own and I can help monitor intake Followup depending on my discussion with Dr Maple Hudson   As of 12/17/2012  Pirfenidone is NOT an approved drug in the [place] but approved in [place], [place], [place], and [place] for treatment of IPF Pirfenidone is not approved for non-IPF indications   BASICS  - Believed to function as a general inhibitor of fibrosis and inflammation Exact mechanism of action unknown - Functions by preventing worsening of IPF. Does not reverse process - Studied only in IPF (not studied in other causes of UIP like autoimmune or asbestosis) - Studied only in mild to moderate severity of IPF (DLCO > 30% and FVC > 50%) - Marketed by company called  [employer], out of [place], [place]  - 1  [place] study and 2 [place]/European studies called CAPACITY and 1 International trial called ASCEND - Most recent result was ASCEND, results released Feb 2014 with positive results - Currently FDA reviewing whether to approve drug or not. - Currently in [place], drug available in > 80 centers through expanded accesss program (safety study and compassionate use): WE ARE one of the sites  RESULTS  - Proportion of patients with > 10% decline in FVC (meaningful parameter) is around 1/3 less with those taking pirfenidone at 52 weeks. Basically prevents decline in IPF  - This has  translated into potentially reduced mortality  (pooled analysis but not powered for it) and reduced hospitalization   DOSING - Dose is 2403 mg per day in 3 divided doses and gradually escalated to this dose over 15 days - Take with food  SIDE EFFECTS - Generally well tolerated. In ASCEND the DC RATE was 14% but this is only 4% more than placebo - GI dyspepsia most common but still tolerated . TAKE With FOOD - Skin irritant - So, wear high SPF  (>50) sun-screen, wear hat & full length clothes   - 1:100 to 1:1000 chance of angioedema in first 90 days  - rare side effect called agranulocytosis  CONTRAINDICATIONS - Severe Hepatic impairment or ESLD (> 5 times ULN) - CKD with Cr CL < 30 and ESRD - QTc b> (? Relative v absolute) - Pregnancy - Concomitant smoking   DRUG INTERACTIONS - Pirfenidone is metabolized through CYP1A2. So caution when there is concomitant use of a CYP enzyme inhibitor - No concomitant FLUVOXAMINE or SMOKING - Avoid grape juice - Caution when using amiodarone, ciproflox, SSRI,  -

## 2012-12-17 NOTE — Assessment & Plan Note (Addendum)
CT chest 10/02/2012 Slightly upper lobe predominant peribronchovascular fibrosis. 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 Certainly NOT CONSISTENT with UIP. This is new since 07/28/2008.   PFT-10/04/2012- FVC 4.30/74%, FEV1 3.45/77%, FEV1/FVC 0.80. TLC 92%, RV 121%, DLCO 40%.  PLAN  - several issues discussed. Total time discussed 30 min face to face which is > 50% of total time spent  - Explained for Pirfenidone through EAP program he needs to have IPF. He does not have IPF because he has Hermansky Pudlak and CT not consisent with UIP. He was very disappointed to hear this  - He strongly feels that he would benefit from Pirfenidone and feels definitely worth trying. HE has made plans to get it from [place] for  3 months supply. I discussed pirfenidone side effect and gae him print out (see instructions). I have agreed to monitor him and support him through pirfenidone usage   - WE discussed his ILD findings. I told him his findings are NOT UIP. I have told him I will try to reach out to [facility] and see what they think his ILD findings are from and if he needs bronch-bal to rule out other etiologies like eosinophilic pneumonia or if NSIP pattern is part of HPS. He is certainly behaving like HPS patient with progressive fibrosis based on my reading of literature. Certainly my expertise with HPS is nil and he is my first patient I have encountered

## 2012-12-24 ENCOUNTER — Telehealth: Payer: Self-pay | Admitting: Internal Medicine

## 2012-12-24 DIAGNOSIS — B356 Tinea cruris: Secondary | ICD-10-CM

## 2012-12-24 DIAGNOSIS — N189 Chronic kidney disease, unspecified: Secondary | ICD-10-CM

## 2012-12-24 DIAGNOSIS — I82409 Acute embolism and thrombosis of unspecified deep veins of unspecified lower extremity: Secondary | ICD-10-CM

## 2012-12-24 DIAGNOSIS — J45909 Unspecified asthma, uncomplicated: Secondary | ICD-10-CM

## 2012-12-24 NOTE — Telephone Encounter (Signed)
Katie  Sending to you because this is a Dr Maple Hudson patient I was asked to see for specific question. Please tell patient  A) I emailed his [facility] doc Dr Virgel Manifold they day I saw him on 12/17/12 but as of 3:20 AM 12/24/2012 no reply from her. My specific question was what she though of his pulmonary ILD  B) tell him to make appt with me or give him appt when he gest his pirfenidone from [place]  C) Also, DR Maple Hudson indicated that I should/could take over pulmonary care for this [facility] Saunas patient. Please double check with Dr Lasandra Beech and if true, fu appt is when patients gets his pirfenidone from [place]  D) when he comes back to bring CD rom of CT chest from [facility]   Dr. Kalman Shan, M.D., [facility].C.P Pulmonary and Critical Care Medicine Staff Physician [facility] Pulmonary and Critical Care Pager: [phone], If no answer or between  15:00h - 7:00h: call [phone]  12/24/2012 3:22 AM

## 2012-12-26 ENCOUNTER — Ambulatory Visit: Payer: Self-pay | Admitting: General Practice

## 2012-12-26 DIAGNOSIS — J849 Interstitial pulmonary disease, unspecified: Secondary | ICD-10-CM

## 2012-12-26 DIAGNOSIS — Z7901 Long term (current) use of anticoagulants: Secondary | ICD-10-CM

## 2012-12-26 DIAGNOSIS — E70331 Hermansky-Pudlak syndrome: Secondary | ICD-10-CM

## 2012-12-26 LAB — POCT INR: INR: 1.9

## 2012-12-27 ENCOUNTER — Telehealth: Payer: Self-pay | Admitting: Internal Medicine

## 2012-12-27 DIAGNOSIS — E663 Overweight: Secondary | ICD-10-CM

## 2012-12-27 DIAGNOSIS — J849 Interstitial pulmonary disease, unspecified: Secondary | ICD-10-CM

## 2012-12-27 MED ORDER — PROMETHAZINE-CODEINE 6.25-10 MG/5ML PO SYRP: 5.0000 mL | ORAL_SOLUTION | Freq: Four times a day (QID) | ORAL | Status: DC | PRN

## 2012-12-27 NOTE — Telephone Encounter (Signed)
Ok to refer to Pulmonary rehab at [place], [facility], [place] or [facility]- his choice.  Dx pulmonary fibrosis  Ok to refer to [facility]   Dx pulmonary fibrosis  Offer script for prometh w/ codeine 200 ml,  5 ml every 6 hours if needed for cough

## 2012-12-27 NOTE — Telephone Encounter (Signed)
Is not working. [name]

## 2012-12-27 NOTE — Telephone Encounter (Signed)
Orders have been placed and rx has been sent and pt is aware. MR the pt is requesting to speak directly to you about new medication. Please advise. Carron Curie, CMA

## 2012-12-27 NOTE — Telephone Encounter (Signed)
The pt is requesting a few things:  1) he is requesting a referral to pulm rehab and a nutritionists because he is working with [facility] for lung transplant and needs to lose 50lbs  2) Pt states that the tessalon is not controlling his cough and he is asking for something else. Please advise.   3) Pt also wants to speak with MR about perfinidone, will send message to MR once other issues are addressed.   Please advise. Carron Curie, CMA

## 2012-12-30 NOTE — Telephone Encounter (Signed)
Ok with me to switch patient to Dr Marchelle Gearing for pirfenidone supervision.

## 2012-12-31 ENCOUNTER — Telehealth (HOSPITAL_COMMUNITY): Payer: Self-pay | Admitting: *Deleted

## 2012-12-31 NOTE — Telephone Encounter (Signed)
[name] has called Pulmonary Rehab regarding referral.  As of this date, we have not received a referral.  He is exercising at home, has access to exercise bike which he will start at home along with elliptical he is currently using  He is concerned about losing weight to be eligible for lung transplant.  Has appointment to see nutritionist.  I have reassured him we will try to get him started here as soon as possible once we receive the referral.  Cathie Olden RN

## 2013-01-01 ENCOUNTER — Telehealth: Payer: Self-pay | Admitting: Internal Medicine

## 2013-01-01 DIAGNOSIS — J849 Interstitial pulmonary disease, unspecified: Secondary | ICD-10-CM

## 2013-01-01 NOTE — Telephone Encounter (Signed)
LMTCB but with specific instruction that he should be able to ask the question on [place] pirfenidone to triage and triage forward questin to me.     Dr. Kalman Shan, M.D., [facility].C.P Pulmonary and Critical Care Medicine Staff Physician [facility]  Pulmonary and Critical Care Pager: [phone], If no answer or between  15:00h - 7:00h: call [phone]  01/01/2013 7:45 PM

## 2013-01-01 NOTE — Telephone Encounter (Signed)
Asking about referral to lung transplant center.

## 2013-01-01 NOTE — Telephone Encounter (Signed)
Spoke with pt and he states that he called pulmonary rehab and they told him that we never sent order Records show that this was already faxed though [facility]'s can you please refax order asap? Thanks!  MR-- pt has been evaluated by [facility] for lung transplant, and now wants to be referred to [facility] for the same Is this okay to send referral? Please advise, thanks!

## 2013-01-01 NOTE — Telephone Encounter (Signed)
He needs to start rehab ASAP; please leave word wit [facility] a) to ensure that referral came in; b) to get him ahead of line due to transplant status  You mean you want to make a refrral to [facility]. ? [facility] does not have doctors. IF [facility], yes send referral  Dr. Kalman Shan, M.D., [facility].C.P Pulmonary and Critical Care Medicine Staff Physician [facility] Pulmonary and Critical Care Pager: [phone], If no answer or between  15:00h - 7:00h: call [phone]  01/01/2013 6:52 PM

## 2013-01-02 ENCOUNTER — Other Ambulatory Visit: Payer: Self-pay | Admitting: General Practice

## 2013-01-02 MED ORDER — WARFARIN SODIUM 2.5 MG PO TABS: ORAL_TABLET | ORAL | Status: DC

## 2013-01-02 NOTE — Telephone Encounter (Signed)
Please tell him  - pft typically every 3 months;   - yes, give him appt 4 weeks - 6 wweelks or so to see me; can rpeat at followup. Can help with pirfenidone - also tell him that given his complicated situation he should probably have scheduled visits every 4-5 weeks and come with his questions at that time instead of calls with several questions   Dr. Kalman Shan, M.D., [facility].C.P Pulmonary and Critical Care Medicine Staff Physician [facility] Pulmonary and Critical Care Pager: [phone], If no answer or between  15:00h - 7:00h: call [phone]  01/02/2013 5:21 PM

## 2013-01-02 NOTE — Telephone Encounter (Signed)
Phyllis asked that I call [employer] and check on prior authorization for Pulmonary Rehab with pt's primary insurance. I contacted [employer] and spoke with Deirdre Priest and pre cert is not required for Pulmonary Rehab as long as it is done outpatient. Rhonda J Cobb

## 2013-01-02 NOTE — Telephone Encounter (Signed)
Called and spoke with [name] at [facility]. She does have the referral for Pulmonary Rehab and she has spoke with patient. Patient has been placed on on Call for Cancel list. [name] Referral to the [facility] and [facility] lung transplant program was faxed in June. [name]

## 2013-01-02 NOTE — Telephone Encounter (Signed)
Yes it is [facility], so I placed the referral. I also called Pulm Rehab and spoke with Liborio Nixon, she looked for the order but could not locate it, but she advised that she will have one of the pulm nurses to look through the referral when they come in and have them call me back. I advised her of the importance of the order and that the pt needs to be scheduled asap. Will await call. Carron Curie, CMA

## 2013-01-02 NOTE — Telephone Encounter (Signed)
Called and spoke with pt and he is aware of the order that was sent to pulmonary rehab.  He stated that they told him it would be about 3 wks or so before they could get him in.    Pt had 2 questions for MR or CY---  1.  How often does he need to have the PFT done to follow his progression? 2.  Pt stated that he got his perfinidone and has taken the first dose,  Does he need to be followed while he is on this medication?    MR please advise. Thanks  Allergies  Allergen Reactions  . Metronidazole     REACTION: neuropathy

## 2013-01-07 ENCOUNTER — Ambulatory Visit: Payer: 59 | Admitting: Internal Medicine

## 2013-01-09 ENCOUNTER — Ambulatory Visit (INDEPENDENT_AMBULATORY_CARE_PROVIDER_SITE_OTHER): Payer: Self-pay | Admitting: General Practice

## 2013-01-09 DIAGNOSIS — E70331 Hermansky-Pudlak syndrome: Secondary | ICD-10-CM

## 2013-01-09 DIAGNOSIS — Z7901 Long term (current) use of anticoagulants: Secondary | ICD-10-CM

## 2013-01-09 DIAGNOSIS — J849 Interstitial pulmonary disease, unspecified: Secondary | ICD-10-CM

## 2013-01-09 DIAGNOSIS — J8410 Pulmonary fibrosis, unspecified: Secondary | ICD-10-CM

## 2013-01-10 ENCOUNTER — Other Ambulatory Visit (INDEPENDENT_AMBULATORY_CARE_PROVIDER_SITE_OTHER): Payer: 59

## 2013-01-10 ENCOUNTER — Ambulatory Visit (INDEPENDENT_AMBULATORY_CARE_PROVIDER_SITE_OTHER): Payer: 59 | Admitting: Internal Medicine

## 2013-01-10 ENCOUNTER — Encounter: Payer: Self-pay | Admitting: Internal Medicine

## 2013-01-10 VITALS — BP 120/80 | HR 96 | Ht 73.0 in | Wt 244.0 lb

## 2013-01-10 DIAGNOSIS — J8410 Pulmonary fibrosis, unspecified: Secondary | ICD-10-CM

## 2013-01-10 DIAGNOSIS — J849 Interstitial pulmonary disease, unspecified: Secondary | ICD-10-CM

## 2013-01-10 LAB — CBC WITH DIFFERENTIAL/PLATELET
Basophils Relative: 0.4 % (ref 0.0–3.0)
Eosinophils Absolute: 0.1 10*3/uL (ref 0.0–0.7)
Hemoglobin: 12.8 g/dL — ABNORMAL LOW (ref 13.0–17.0)
Lymphs Abs: 1.5 10*3/uL (ref 0.7–4.0)
MCHC: 32.3 g/dL (ref 30.0–36.0)
MCV: 84.6 fl (ref 78.0–100.0)
Monocytes Absolute: 0.6 10*3/uL (ref 0.1–1.0)
Neutro Abs: 4.3 10*3/uL (ref 1.4–7.7)
RBC: 4.69 Mil/uL (ref 4.22–5.81)

## 2013-01-10 LAB — HEPATIC FUNCTION PANEL
Bilirubin, Direct: 0.1 mg/dL (ref 0.0–0.3)
Total Bilirubin: 0.7 mg/dL (ref 0.3–1.2)

## 2013-01-10 NOTE — Progress Notes (Signed)
Subjective:    Patient ID: [name], male    DOB: [birth year], 32 y.o.   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   Here with friend [name] Last here August 09, 2010 Currently says breathing is pretty good. Had temp 100 degrees x 1 week, sore throat, tender left cervical node. Had dieted and lost 40 lbs since off prednisone x 6 months. Now on 6-mercaptopurine (which drops WBC to 3000) and Remicade. These drugs are managed by Dr Sherrill/ Heme Onc and Dr Schooler/ GI. Denies wheeze, phlegm. Took oxycodone this AM for pains. For the past year has had some dry cough. Concerned about this as an early fibrosis marker. Wants PFT every 6 months. He plans to go( unscheduled so far) for CXR and [facility] at [facility] so he asks not to do a cxr here so as to minimize radiation exposure.    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   Since last here he had a total colectomy in 2012. Had ER visit at [facility] 03/24/2011 and CT scan suggested "something in lung" which he wanted to review. Treated with Levaquin. Has also had ER visit for kidney stone. Has stage III renal disease with 20% function-not known why. CXR 11/26/11- he reports "much clearer". Images were available on discs he brought from [facility]. We reviewed the images together. It looks as if he was dealing with a pneumonia in October with some residual density-pneumonia versus atelectasis-on the recent chest x-ray. He has minimal cough and no acute chest symptoms. Some of his interstitial markings may have been a little prominent but there was no dramatic interstitial fibrosis.  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 ACUTE VISIT: about 1 week or so ago started having cough, congestion, and had stomach flu over the weekend; noticed increase in mucus-started abx yesterday and has reduced the mucus amount. Major concern was breathing issues-felt unable to get breath and go any further. Noiced wheezing as well.We had called in augmentin yesterday to start because of his concern he was getting pneumonia. Describes 2 weeks of increased chest congestion and chest tightness but very productive cough. GI viral gastroenteritis pattern over this weekend seems to be a different problem. We had called in Augmentin which further aggravated loose stools through his colostomy. He is not on a routine probiotic. Today feeling better. Denies fever or sore throat.  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 ACUTE VISIT: SOB increased and yellow productive cough, labored breathing when talking,etc. Feelings of drowning and passing out; was given Doxycycline and Cipro We had stopped Augmentin at last visit because of watery diarrhea attributed to the antibiotic, although it may have been a separate viral gastroenteritis. He now says he chose to continue the Augmentin. We were closed first no last week he called the on-call doctor to get prescriptions for doxycycline and Cipro. Prednisone already being taken for his colitis was increased to 40 mg daily for wheezing. Cough was very productive. Today he asks he worked in, reporting more shortness of breath in the last few days "scary". Using rescue inhaler and resumed Symbicort for  modest benefit. He notices some wheezing lying down but not when he is upright. Arthralgias in   knees, no rash, fever or purulent sputum. He had past history of DVT complicating his colectomy by report. CXR 07/19/12- IMPRESSION:  Central peribronchial thickening, probably indicative of  bronchitis. No frank edema or consolidation.  Original Report Authenticated By: Bretta Bang, M.D.  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 FOLLOWS FOR: was told had blood clot and currently on coumadin now; would like to discuss coming off of it if possible.  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 Lovenox/warfarin lead to rectal bleeding-now on warfarin alone. Followed by hematologist at [facility]. Question this is an old clot -he did have DVT after previous surgery- but probably new/recurrent. No hemoptysis. He feels pneumonia resolved. Chest feels well. Home health nurse following INR. CXR 08/14/12 IMPRESSION:  Persistent patchy opacities in the left upper lobe and right base  dating back to 07/16/2012. Findings likely reflect residual changes  of pneumonia or potentially chronic underlying scarring/fibrosis.  Recommend additional repeat chest x-ray in 2 - 4 weeks. If there  is still no interval change, further evaluation with CT scan of the  chest would likely be warranted to further evaluate.  Original Report Authenticated By: Malachy Moan, M.D.  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 ACUTE VISIT: having trouble breathing x 2 weeks(happens with minor exertion) denies any wheezing, cough, or congestion. He started Zyrtec 2 weeks ago. Not using Symbicort "just too many meds" and not wheezing. He notes easy dyspnea on exertion  across a room or a telephone. Denies phlegm or cough, fever, sweats or chills. Feet are not swelling. We discussed anemia given his history of GI bleed. CXR 09/04/12 IMPRESSION:  Persistent streaky airspace/infiltrates bilateral upper and lower  lobe with central distribution. The findings probable due to  persistent pneumonia superimposed on chronic interstitial lung  disease. Slight worsening in aeration right upper lobe. Follow-up  to resolution is recommended. Further evaluation with CT of the  chest could be performed as clinically warranted.  Original Report Authenticated By: Natasha Mead, M.D.  10/08/12- 6 yoM never smoker followed for asthma, allergic rhinitis, recurrent respiratory infections, Inflammatory bowel disease/ colostomy (Hermansky-Pudlak Syndrome- albinism, colitis, pulmonary fibrosis).Recurrent DVT. Father and wife are here. Follow up to discuss test results.  States o2 sats are staying 88-90% RA during the day. He had an apparent pneumonia in February. Shortness of breath improved with antibiotics been diagnosed with recurrent DVT/PE which is followed by hematology at [facility] with plan to treat with anticoagulation for 3 months. He had a GI bleed. INR has been labile. Now on iron for anemia. In the last few weeks he has felt more short of breath especially with exertion, breathless with speech on room air. Not much effect from his inhalers. He gave disc of his [facility] to [facility]- they have expert on his syndrome. Using the elliptical trainer for exercise, 30 minutes is getting too hard and he has reduced to 10 minute sessions. ONOX- results pending PFT-10/04/2012-mild obstructive airways disease with response to bronchodilator, borderline mild air trapping with increased RV, diffusion severely reduced. FVC 4.30/74%, FEV1 3.45/77%, FEV1/FVC 0.80. TLC 92%, RV 121%, DLCO 40%. CT 4/24 /14  IMPRESSION:  Slightly upper lobe predominant  peribronchovascular fibrosis, as   detailed above. This is new since 07/28/2008.  No evidence of acute superimposed process.  Mild thoracic adenopathy, favored to be reactive.  Original Report Authenticated By: Jeronimo Greaves, M.D.  10/18/12- 38 yoM never smoker followed for asthma, allergic rhinitis, recurrent respiratory infections, Inflammatory bowel disease/ colostomy (Hermansky-Pudlak Syndrome- albinism, colitis, pulmonary fibrosis).Recurrent DVT. Father and wife are here. FOLLOWS FOR: Feels that his breathing has gotten worse. He was exposed to someone who later developed pneumonia P. Reports DOE while on 3L and minor congestion. Denies chest pain or wheezing. Acute Visit, mostly wanting consultation. [place] is noticing a decline in oxygen saturation with easier dyspnea on exertion. Sometimes saturation dips to 88% on 3 L and his heartbeat seems rapid. He has been on clobetasol for active Crohn's disease. Currently off Dapsone and 6 MCP. He remains on warfarin after recurrent DVT.  11/11/12-    Father here FOLLOWS FOR: pt states chest congestion has not improved in last 1 week-- would like to discuss perfinidone options-- would also like to discuss lung tx options--should pt stay away from daughter who will get vaccinations tomorrow On Zithromax. Feeling better. Mucinex and water helps clear mucus. Family is passing around cold. He remains on and clobetasol, but has finished prednisone taper. Wife has had strep throat. CXR 10/18/12-  IMPRESSION:  Stable chronic interstitial lung disease/fibrosis.  Original Report Authenticated By: Judie Petit. Shick, M.D.  01/10/13- 66 yoM never smoker followed for asthma, allergic rhinitis, recurrent respiratory infections, Inflammatory bowel disease/ colostomy (Hermansky-Pudlak Syndrome- albinism, colitis, pulmonary fibrosis).Recurrent DVT. FOLLOWS FOR: IPF. Pt states when he tries to take a deep breathe he has chest pains. This has been going on x 1 week. Also reports increase SOB w/ exertion. He is  using 6 liters O2 w/ exertion and 2-3 liters at rest B. [facility] transplant program has told him he must lose 63 pounds, so we can re- refer him at 190 pounds. They also want him off of fentanyl and ativan. He is now working hard at these. He would apparently need a heart and kidney transplant. If he forces deep breaths during exercise he gets diffuse anterior chest pain that seems more related to the deep breath than to the exertion. He has gotten generic Perfenidone/ Perfenex from [place] and is taking 200 mg x 4, TID.  ROS-see HPI Constitutional:   No-   weight loss, night sweats, fevers, chills,  sore throat,       No-  sneezing, itching, ear ache, nasal congestion, post nasal drip,  CV:  +chest pain, no-orthopnea, PND, +swelling in lower extremities, no-anasarca, dizziness, palpitations Resp: + shortness of breath with exertion or at rest.              + productive cough,  No- non-productive cough,  No- coughing up of blood.              No-change in color of mucus.  No- wheezing.   Skin: No-   rash or lesions. GI:  No-   heartburn, indigestion, abdominal pain, no- nausea, vomiting,  GU:  MS:  No-   joint pain or swelling.   Neuro-     nothing unusual Psych:  + change in mood or affect. + depression or anxiety.  No memory loss.  OBJ- Physical Exam BP 120/80  Pulse 96  Ht 6\' 1"  (1.854 m)  Wt 244 lb (110.678 kg)  BMI 32.2 kg/m2  SpO2 98%  General- Alert, Oriented, Affect-calm today, Distress- none  acute, albino, O2 4L Skin- +Albino. +some ecchymoses on his arms Lymphadenopathy- none Head- atraumatic            Eyes-  PERRLA, conjunctivae and secretions clear. +Strabismus, +nystagmus            Ears- Hearing, canals-normal            Nose- Clear, no-Septal dev, mucus, polyps, erosion, perforation             Throat- Mallampati II , mucosa clear , drainage- none, tonsils- atrophic Neck- flexible , trachea midline, no stridor , thyroid nl, carotid no bruit Chest - symmetrical excursion  , unlabored           Heart/CV- RRR , no murmur , no gallop  , no rub, nl s1 s2                           - JVD- none , edema-none, stasis changes- none, varices- none           Lung- + trace crackles heard, wheeze- none, cough-none , dullness-none, rub- none.            Chest wall- ? Tender to pressure sternum Abd- +colostomy Gen/ Rectal- Not done, not indicated Extrem- cyanosis- none, clubbing, none, atrophy- none, strength- nl.  Neuro- grossly intact to observation except nystagmus

## 2013-01-10 NOTE — Telephone Encounter (Signed)
Pt has been set-up with rehab and will start in a few weeks. Also the pt was asking about referral to [facility] ([facility] I showed the pt a copy of the letter that we received from [facility] stating that they have denied him due to needing both a lung and liver transplant. The pt states that this is not correct information, that he does not need a liver but a kidney transplant. The pt requested a copy of the letter and is going to contact [facility] about this. He also requests that MR call [facility] and discuss to see exactly what the issue is. The pt was never actually contacted or even seen by [facility] so he is upset about this. I advised I will speak with MR about this on his return. Carron Curie, CMA

## 2013-01-10 NOTE — Patient Instructions (Addendum)
Order- schedule PFT, 6 MWT  Future in October    Dx pulmonary fibrosis  Order- [facility] re-refer to [facility] lung transplant assessment  Order- lab- Liver panel, CBC w diff      Dx pulmonary fibrosis  See Dr Yetta Barre for his opinion about your chest pains

## 2013-01-10 NOTE — Telephone Encounter (Signed)
I spoke with the pt today  And advised of everything. The pt states he has been on the pirfenidone x 4 weeks. He saw CY today for an acute visit and CY ordered some labs. Please advise when the pt needs to f/u now since he has already started on the medication?? Carron Curie, CMA

## 2013-01-13 NOTE — Telephone Encounter (Signed)
Another 4-6 weeks from now he should see me   Dr. Kalman Shan, M.D., [facility].C.P Pulmonary and Critical Care Medicine Staff Physician [facility] [place] [facility] Pager: [phone], If no answer or between  15:00h - 7:00h: call [phone]  01/13/2013 11:37 PM

## 2013-01-13 NOTE — Telephone Encounter (Signed)
Spoke with patient-- Patient would like Dr. Marchelle Gearing to be aware that he has started pirfenex rx Currently taking 3 pills TID and will soon titrate up to 4 pills TID Patient would like to know when Dr. Marchelle Gearing would like him to come in for testing, Will route to MR to address upon return to office

## 2013-01-15 ENCOUNTER — Encounter: Payer: 59 | Attending: Internal Medicine | Admitting: Dietician

## 2013-01-15 VITALS — Ht 73.0 in | Wt 250.7 lb

## 2013-01-15 DIAGNOSIS — E669 Obesity, unspecified: Secondary | ICD-10-CM | POA: Insufficient documentation

## 2013-01-15 DIAGNOSIS — Z713 Dietary counseling and surveillance: Secondary | ICD-10-CM | POA: Insufficient documentation

## 2013-01-15 NOTE — Telephone Encounter (Signed)
appt set for 02-19-13 at 3:30. Carron Curie, CMA

## 2013-01-15 NOTE — Progress Notes (Signed)
  Medical Nutrition Therapy:  Appt start time: 1630 end time:  1730.   Assessment:  Primary concerns today: [name] is here today because he needs to lose 50 pounds in order to be eligible to get a lung and kidney transplant per his doctor at [facility]. The transplant window is likely between 6 months-1 year. [facility] won't officially evaluate for surgery until ideal BMI is reached. BMI requirement is 25-27 max for surgery and his current BMI is 33. In order to lose 50 pounds in 6 months, [name] would need to lose 2 pounds be week.   Diagnosed with rare disease Hermansy Pudlak Syndrome at age 33 and  has fibrotic lung disease and kidney disease as a result of disease. Extensive medical history also  includes inflammatory bowel disease. Has frequently taken predinsone which caused weight gain of about 50-70 pounds.   Had ileostomy (total removal of colon) in 2012. Had bleeding disorder and currently on warfarin. On waiting list with [facility] for pulmonary rehab, since he currently requires an oxygen tank.   [name] lives with wife and two children and does not currently work. Has access to treadmill at home and walking on treadmill 45-1 hour most days. [name] sleeps in most days and skips breakfast.   Trying to eat healthy diet recently since week of July 15th. Decreased soft drinks to every other week. Weight with clothing several weeks ago was 253 lbs and states weight at home is 242 pounds.   MEDICATIONS: see list   DIETARY INTAKE:   24-hr recall:  B ( AM): skips most of the time, will have oatmeal with apple, splenda, milk and pecans, stone ground grits, chex with 2% or whole milk , or ensure  Snk ( AM): none  L ( PM): [place] sandwich on wheat bread, canned vegetable soup with sweet tea (reduced sugar) or water  Snk ( PM): none D ( PM): fish or chicken with one or two vegetables with salad with cucumber and tomato having dressing with yogurt, squash, steamed broccoli Snk ( PM): cheese, chips, ice cream, hummus  and organic carrots Beverages: sweet tea (reduced sugar) or water, apple or orange juice   Usual physical activity: 45-60 minutes on treadmill 6-7 days/week   Estimated energy needs: 2200 calories 248 g carbohydrates 165 g protein 61 g fat  Progress Towards Goal(s):  In progress.   Nutritional Diagnosis:  NB-1.1 Food and nutrition-related knowledge deficit As related to history of meal skipping and energy dense food and beverage choices.  As evidenced by BMI of 33.    Intervention:  Nutrition counseling provided. Advised [name] to talk to his kidney doctor and doctor who manages his Warfarin about making dietary changes that will likely increase his protein intake and possibly increase his vitamin K intake. Recommended having another appointment in two weeks since much of the visit was spent discussing his extensive medical history. Suggested he look into the possibility of doing a medically supervised weight loss program such as Optifast, if it is ok with his doctors, to ensure he loses the amount of weight needed to do his life-saving transplant surgeries.   Handouts given during visit include:  MyPlate  Monitoring/Evaluation:  Dietary intake, exercise, and body weight in 2 week(s).

## 2013-01-16 ENCOUNTER — Ambulatory Visit (INDEPENDENT_AMBULATORY_CARE_PROVIDER_SITE_OTHER): Payer: 59 | Admitting: General Practice

## 2013-01-16 ENCOUNTER — Encounter: Payer: Self-pay | Admitting: Dietician

## 2013-01-16 DIAGNOSIS — J8410 Pulmonary fibrosis, unspecified: Secondary | ICD-10-CM

## 2013-01-16 DIAGNOSIS — Z7901 Long term (current) use of anticoagulants: Secondary | ICD-10-CM

## 2013-01-16 DIAGNOSIS — J849 Interstitial pulmonary disease, unspecified: Secondary | ICD-10-CM

## 2013-01-16 DIAGNOSIS — E70331 Hermansky-Pudlak syndrome: Secondary | ICD-10-CM

## 2013-01-17 ENCOUNTER — Telehealth: Payer: Self-pay | Admitting: Internal Medicine

## 2013-01-17 MED ORDER — PROMETHAZINE-CODEINE 6.25-10 MG/5ML PO SYRP: 5.0000 mL | ORAL_SOLUTION | Freq: Four times a day (QID) | ORAL | Status: DC | PRN

## 2013-01-17 NOTE — Progress Notes (Signed)
Quick Note:  Pt aware of results. ______ 

## 2013-01-17 NOTE — Telephone Encounter (Signed)
Pt requesting refill of Promethazine-Codeine (PHENERGAN WITH CODEINE) 6.25/10mg /5ML Take 5 mLs by mouth every 6 (six) hours as needed for cough.  Last filled 12/27/12  #2105mL by Dr Eligah East. Yancey Flemings Upcoming appt w/MR 02/19/13  [facility]  Please advise Dr Maple Hudson if okay to refill for patient. Thanks!

## 2013-01-17 NOTE — Telephone Encounter (Signed)
Ok to refill now and x 3 more

## 2013-01-17 NOTE — Telephone Encounter (Signed)
Refill of Promethazine-Codeine (PHENERGAN WITH CODEINE) 6.25/10mg /5ML  Take 5 mLs by mouth every 6 (six) hours as needed for cough.  #278[facility]

## 2013-01-19 NOTE — Assessment & Plan Note (Signed)
Pulmonary status/ IPF has progressed significantly in the last 2 years. He has made a commitment to try to qualify for transplant. He asks we refer him again [facility], and we also discussed Claris Gower as a possibility. As always, today he had many questions and our discussion was extended 40 minutes.

## 2013-01-20 ENCOUNTER — Encounter (HOSPITAL_COMMUNITY): Payer: Self-pay

## 2013-01-20 ENCOUNTER — Encounter (HOSPITAL_COMMUNITY)
Admission: RE | Admit: 2013-01-20 | Discharge: 2013-01-20 | Disposition: A | Discharge status: Home / Self Care | Payer: 59 | Source: Ambulatory Visit | Attending: Internal Medicine | Admitting: Internal Medicine

## 2013-01-20 DIAGNOSIS — Z5189 Encounter for other specified aftercare: Secondary | ICD-10-CM | POA: Insufficient documentation

## 2013-01-20 DIAGNOSIS — N189 Chronic kidney disease, unspecified: Secondary | ICD-10-CM

## 2013-01-20 DIAGNOSIS — J45909 Unspecified asthma, uncomplicated: Secondary | ICD-10-CM | POA: Insufficient documentation

## 2013-01-20 DIAGNOSIS — Z86718 Personal history of other venous thrombosis and embolism: Secondary | ICD-10-CM | POA: Insufficient documentation

## 2013-01-20 DIAGNOSIS — Z79899 Other long term (current) drug therapy: Secondary | ICD-10-CM | POA: Insufficient documentation

## 2013-01-20 HISTORY — DX: Chronic kidney disease, unspecified: N18.9

## 2013-01-20 HISTORY — DX: Coagulation defect, unspecified: D68.9

## 2013-01-20 NOTE — Progress Notes (Addendum)
Mr. Canelo arrived today via wheelchair  for orientation to Pulmonary Rehab.  He is accompanied by his significant other.  He is using nasal oxygen @6  pulsed. He is pale, skin warm and dry. He is alert, oriented and affect is calm.  He has albinism and limited distant vision. Oriented ,and  equal and strong grip.  Heart rate is regular.  Denies and family history of cardiac problems. He has multiple medical issues due to his rare Hermansky-Pudlak Syndrome.  Review of medications with patient and his wife, and his medical history.  Denies any pain today , he has had joint pain in the past.  Breath sounds clear.   Peripheral pulses +3, very mild swelling bilateral lower extremities, non pitting.  He does have a dry cough which increases with exercise.  He uses a RX cough med at home prior to exercise.   PH score 9.  He is on medication and also sees a Therapist, sports.  We discussed department  Expectations, goals of weight loss and increasing exercise in preparation for possible lung and kidney transplant.  He has appointment tomorrow at [facility] [place] for consideration of Opti fast Diet to promote weight loss  . Demonstration and practice of PLB using pulse oximeter.  Patient able to return demonstration satisfactorily. Safety and hand hygiene in the exercise area reviewed with patient.  Patient voices understanding. He will return tomorrow for 6 min walk test and begin exercise on 01/23/13.  We look forward to working with this gentleman and improving his strength and stamina and will continue to offer support and encouragement. [name]    2:55 pm to 4:00 pm   Cathie Olden RN

## 2013-01-21 ENCOUNTER — Encounter (HOSPITAL_COMMUNITY)
Admission: RE | Admit: 2013-01-21 | Discharge: 2013-01-21 | Disposition: A | Discharge status: Home / Self Care | Payer: 59 | Source: Ambulatory Visit | Attending: Internal Medicine | Admitting: Internal Medicine

## 2013-01-21 NOTE — Progress Notes (Signed)
[name] arrived today at 3:45 for 6 min walk test prior to starting exercise sessions on 01/23/13 Cathie Olden RN

## 2013-01-23 ENCOUNTER — Encounter (HOSPITAL_COMMUNITY): Payer: Self-pay

## 2013-01-23 ENCOUNTER — Encounter (HOSPITAL_COMMUNITY)
Admission: RE | Admit: 2013-01-23 | Discharge: 2013-01-23 | Disposition: A | Discharge status: Home / Self Care | Payer: 59 | Source: Ambulatory Visit | Attending: Internal Medicine | Admitting: Internal Medicine

## 2013-01-23 NOTE — Progress Notes (Signed)
Patient had a very good first day in pulmonary rehab.  Patient's HR was high post exercise: 111 BPM, BP as 118/62, SAT 99% on 4L nasal canula.  Patient started off on the elliptical and his HR got up to 169BPM.  Patient was educated on THR limits and how to monitor during exercise.  Patient also did the rowing machine and the Airdyne.  The patient did not complete the whole 15 minutes on each piece of equipment due to orientation to the equipment and education on pursed lip breathing.  Patient was on 50% Venturi Mask and SAT during exercise and stayed above 95%. Patient started the Opti Fast diet this morning and stated that he did not feel that hungry.  Patient was urged to drink lots of water during exercise session.  Patient was educated on the weather and how it effects breathing. Patient left rehab feeling good!

## 2013-01-28 ENCOUNTER — Encounter (HOSPITAL_COMMUNITY)
Admission: RE | Admit: 2013-01-28 | Discharge: 2013-01-28 | Disposition: A | Discharge status: Home / Self Care | Payer: 59 | Source: Ambulatory Visit | Attending: Internal Medicine | Admitting: Internal Medicine

## 2013-01-28 ENCOUNTER — Ambulatory Visit (INDEPENDENT_AMBULATORY_CARE_PROVIDER_SITE_OTHER): Payer: 59 | Admitting: Family Medicine

## 2013-01-28 ENCOUNTER — Telehealth: Payer: Self-pay | Admitting: *Deleted

## 2013-01-28 DIAGNOSIS — Z7901 Long term (current) use of anticoagulants: Secondary | ICD-10-CM

## 2013-01-28 DIAGNOSIS — E70331 Hermansky-Pudlak syndrome: Secondary | ICD-10-CM

## 2013-01-28 DIAGNOSIS — J8410 Pulmonary fibrosis, unspecified: Secondary | ICD-10-CM

## 2013-01-28 DIAGNOSIS — J849 Interstitial pulmonary disease, unspecified: Secondary | ICD-10-CM

## 2013-01-28 NOTE — Telephone Encounter (Signed)
Ok on #1 Please ask pulm about the #2

## 2013-01-28 NOTE — Telephone Encounter (Signed)
Left detailed message on VM with MDs response.

## 2013-01-28 NOTE — Progress Notes (Signed)
Rosalita Chessman, RN mentioned MD may change pt's entire Coumadin dose altogether.

## 2013-01-28 NOTE — Telephone Encounter (Signed)
[name] called requesting to recertify home health.  Also requests whether Pulmonary Rehab is a possibility.  Please advise

## 2013-01-29 ENCOUNTER — Ambulatory Visit: Payer: 59 | Admitting: Dietician

## 2013-01-30 ENCOUNTER — Encounter (HOSPITAL_COMMUNITY)
Admission: RE | Admit: 2013-01-30 | Discharge: 2013-01-30 | Disposition: A | Discharge status: Home / Self Care | Payer: 59 | Source: Ambulatory Visit | Attending: Internal Medicine | Admitting: Internal Medicine

## 2013-02-04 ENCOUNTER — Encounter (HOSPITAL_COMMUNITY)
Admission: RE | Admit: 2013-02-04 | Discharge: 2013-02-04 | Disposition: A | Discharge status: Home / Self Care | Payer: 59 | Source: Ambulatory Visit | Attending: Internal Medicine | Admitting: Internal Medicine

## 2013-02-06 ENCOUNTER — Encounter (HOSPITAL_COMMUNITY): Payer: 59

## 2013-02-06 ENCOUNTER — Telehealth (HOSPITAL_COMMUNITY): Payer: Self-pay | Admitting: *Deleted

## 2013-02-07 ENCOUNTER — Ambulatory Visit (INDEPENDENT_AMBULATORY_CARE_PROVIDER_SITE_OTHER): Payer: Self-pay | Admitting: General Practice

## 2013-02-07 DIAGNOSIS — J8410 Pulmonary fibrosis, unspecified: Secondary | ICD-10-CM

## 2013-02-07 DIAGNOSIS — Z7901 Long term (current) use of anticoagulants: Secondary | ICD-10-CM

## 2013-02-07 DIAGNOSIS — J849 Interstitial pulmonary disease, unspecified: Secondary | ICD-10-CM

## 2013-02-07 DIAGNOSIS — E70331 Hermansky-Pudlak syndrome: Secondary | ICD-10-CM

## 2013-02-11 ENCOUNTER — Encounter (HOSPITAL_COMMUNITY)
Admission: RE | Admit: 2013-02-11 | Discharge: 2013-02-11 | Disposition: A | Discharge status: Home / Self Care | Payer: 59 | Source: Ambulatory Visit | Attending: Internal Medicine | Admitting: Internal Medicine

## 2013-02-11 DIAGNOSIS — Z79899 Other long term (current) drug therapy: Secondary | ICD-10-CM | POA: Insufficient documentation

## 2013-02-11 DIAGNOSIS — J45909 Unspecified asthma, uncomplicated: Secondary | ICD-10-CM | POA: Insufficient documentation

## 2013-02-11 DIAGNOSIS — Z86718 Personal history of other venous thrombosis and embolism: Secondary | ICD-10-CM | POA: Insufficient documentation

## 2013-02-11 DIAGNOSIS — Z5189 Encounter for other specified aftercare: Secondary | ICD-10-CM | POA: Insufficient documentation

## 2013-02-13 ENCOUNTER — Encounter (HOSPITAL_COMMUNITY)
Admission: RE | Admit: 2013-02-13 | Discharge: 2013-02-13 | Disposition: A | Discharge status: Home / Self Care | Payer: 59 | Source: Ambulatory Visit | Attending: Internal Medicine | Admitting: Internal Medicine

## 2013-02-14 ENCOUNTER — Ambulatory Visit: Payer: 59 | Admitting: General Practice

## 2013-02-14 DIAGNOSIS — E70331 Hermansky-Pudlak syndrome: Secondary | ICD-10-CM

## 2013-02-14 DIAGNOSIS — Z7901 Long term (current) use of anticoagulants: Secondary | ICD-10-CM

## 2013-02-14 DIAGNOSIS — J849 Interstitial pulmonary disease, unspecified: Secondary | ICD-10-CM

## 2013-02-18 ENCOUNTER — Ambulatory Visit: Payer: 59 | Admitting: Internal Medicine

## 2013-02-18 ENCOUNTER — Encounter (HOSPITAL_COMMUNITY): Payer: Self-pay

## 2013-02-18 ENCOUNTER — Encounter (HOSPITAL_COMMUNITY)
Admission: RE | Admit: 2013-02-18 | Discharge: 2013-02-18 | Disposition: A | Discharge status: Home / Self Care | Payer: 59 | Source: Ambulatory Visit | Attending: Internal Medicine | Admitting: Internal Medicine

## 2013-02-18 ENCOUNTER — Encounter: Payer: Self-pay | Admitting: Internal Medicine

## 2013-02-18 NOTE — Progress Notes (Signed)
I have reviewed a Home Exercise Program with the patient.  [name] will be walking outside, utilizing the recumbent bike, and treadmill that he has at home.  I have advised him to exercise 4-5 days a week on days that he does not attend Pulmonary Rehab.  [name] has been given heart rate and oxygen saturation parameters to watch closely while exercising at home.   We reviewed exercise guidelines, target heart rate during exercise, oxygen use, weather, home pulse oximeter, endpoints for exercise, and goals.  Patient has stated that they understand and are encouraged to come to me with any questions.

## 2013-02-19 ENCOUNTER — Other Ambulatory Visit (INDEPENDENT_AMBULATORY_CARE_PROVIDER_SITE_OTHER): Payer: 59

## 2013-02-19 ENCOUNTER — Encounter: Payer: Self-pay | Admitting: Internal Medicine

## 2013-02-19 ENCOUNTER — Ambulatory Visit (INDEPENDENT_AMBULATORY_CARE_PROVIDER_SITE_OTHER): Payer: 59 | Admitting: Internal Medicine

## 2013-02-19 VITALS — BP 120/82 | HR 96 | Temp 98.4°F | Ht 73.0 in | Wt 235.0 lb

## 2013-02-19 DIAGNOSIS — J849 Interstitial pulmonary disease, unspecified: Secondary | ICD-10-CM

## 2013-02-19 DIAGNOSIS — I2699 Other pulmonary embolism without acute cor pulmonale: Secondary | ICD-10-CM

## 2013-02-19 DIAGNOSIS — J8410 Pulmonary fibrosis, unspecified: Secondary | ICD-10-CM

## 2013-02-19 DIAGNOSIS — Z23 Encounter for immunization: Secondary | ICD-10-CM

## 2013-02-19 LAB — BASIC METABOLIC PANEL
CO2: 21 mEq/L (ref 19–32)
Calcium: 8.9 mg/dL (ref 8.4–10.5)
Chloride: 108 mEq/L (ref 96–112)
Creatinine, Ser: 1.8 mg/dL — ABNORMAL HIGH (ref 0.4–1.5)
Glucose, Bld: 95 mg/dL (ref 70–99)
Sodium: 138 mEq/L (ref 135–145)

## 2013-02-19 LAB — CBC
HCT: 36.5 % — ABNORMAL LOW (ref 39.0–52.0)
Hemoglobin: 12 g/dL — ABNORMAL LOW (ref 13.0–17.0)
RBC: 4.38 Mil/uL (ref 4.22–5.81)
WBC: 6.3 10*3/uL (ref 4.5–10.5)

## 2013-02-19 LAB — HEPATIC FUNCTION PANEL
ALT: 45 U/L (ref 0–53)
Albumin: 3.6 g/dL (ref 3.5–5.2)
Alkaline Phosphatase: 77 U/L (ref 39–117)
Bilirubin, Direct: 0.1 mg/dL (ref 0.0–0.3)
Total Protein: 7.6 g/dL (ref 6.0–8.3)

## 2013-02-19 NOTE — Progress Notes (Signed)
Subjective:    Patient ID: Asa Saunas, male    DOB: 01/24/1981, 32 y.o.   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   Here with friend [name] Last here August 09, 2010 Currently says breathing is pretty good. Had temp 100 degrees x 1 week, sore throat, tender left cervical node. Had dieted and lost 40 lbs since off prednisone x 6 months. Now on 6-mercaptopurine (which drops WBC to 3000) and Remicade. These drugs are managed by Dr Sherrill/ Heme Onc and Dr Schooler/ GI. Denies wheeze, phlegm. Took oxycodone this AM for pains. For the past year has had some dry cough. Concerned about this as an early fibrosis marker. Wants PFT every 6 months. He plans to go( unscheduled so far) for CXR and BAL at the [facility] so he asks not to do a cxr here so as to minimize radiation exposure.     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   Since last here he had a total colectomy in 2012. Had ER visit at [facility] 03/24/2011 and CT scan suggested "something in lung" which he wanted to review. Treated with Levaquin. Has also had ER visit for kidney stone. Has stage III renal disease with 20% function-not known why. CXR 11/26/11- he reports "much clearer". Images were available on discs he brought from [facility]. We reviewed the images together. It looks as if he was dealing with a pneumonia in October with some residual density-pneumonia versus atelectasis-on the recent chest x-ray. He has minimal cough and no acute chest symptoms. Some of his interstitial markings may have been a little prominent but there was no dramatic interstitial fibrosis.  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 ACUTE VISIT: about 1 week or so ago started having cough, congestion, and had stomach flu over the weekend; noticed increase in mucus-started abx yesterday and has reduced the mucus amount. Major concern was breathing issues-felt unable to get breath and go any further. Noiced wheezing as well.We had called in augmentin yesterday to start because of his concern he was getting pneumonia. Describes 2 weeks of increased chest congestion and chest tightness but very productive cough. GI viral gastroenteritis pattern over this weekend seems to be a different problem. We had called in Augmentin which further aggravated loose stools through his colostomy. He is not on a routine probiotic. Today feeling better. Denies fever or sore throat.  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 ACUTE VISIT: SOB increased and yellow productive cough, labored breathing when talking,etc. Feelings of drowning and passing out; was given Doxycycline and Cipro We had stopped Augmentin at last visit because of watery diarrhea attributed to the antibiotic, although it may have been a separate viral gastroenteritis. He now says he chose to continue the Augmentin. We were closed first no last week he called the on-call doctor to get prescriptions for doxycycline and Cipro. Prednisone already being taken for his colitis was increased to 40 mg daily for wheezing. Cough was very productive. Today he asks he worked in, reporting more shortness of breath in the last few days "scary". Using rescue inhaler and resumed Symbicort  for modest benefit. He notices some wheezing lying down but not when he is upright. Arthralgias in   knees, no rash, fever or purulent sputum. He had past history of DVT complicating his colectomy by report. CXR 07/19/12- IMPRESSION:  Central peribronchial thickening, probably indicative of  bronchitis. No frank edema or consolidation.  Original Report Authenticated By: Bretta Bang, M.D.  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 FOLLOWS FOR: was told had blood clot and currently on coumadin now; would like to discuss coming off of it if possible.  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 Lovenox/warfarin lead to rectal bleeding-now on warfarin alone. Followed by hematologist at [facility]. Question this is an old clot -he did have DVT after previous surgery- but probably new/recurrent. No hemoptysis. He feels pneumonia resolved. Chest feels well. Home health nurse following INR. CXR 08/14/12 IMPRESSION:  Persistent patchy opacities in the left upper lobe and right base  dating back to 07/16/2012. Findings likely reflect residual changes  of pneumonia or potentially chronic underlying scarring/fibrosis.  Recommend additional repeat chest x-ray in 2 - 4 weeks. If there  is still no interval change, further evaluation with CT scan of the  chest would likely be warranted to further evaluate.  Original Report Authenticated By: Malachy Moan, M.D.  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 ACUTE VISIT: having trouble breathing x 2 weeks(happens with minor exertion) denies any wheezing, cough, or congestion. He started Zyrtec 2 weeks ago. Not using Symbicort "just too many meds" and not wheezing. He notes easy dyspnea on exertion  across a room or a telephone. Denies phlegm or cough, fever, sweats or chills. Feet are not swelling. We discussed anemia given his history of GI bleed. CXR 09/04/12 IMPRESSION:  Persistent streaky airspace/infiltrates bilateral upper and lower  lobe with central distribution. The findings probable due to  persistent pneumonia superimposed on chronic interstitial lung  disease. Slight worsening in aeration right upper lobe. Follow-up  to resolution is recommended. Further evaluation with CT of the  chest could be performed as clinically warranted.  Original Report Authenticated By: Natasha Mead, M.D.  10/08/12- 18 yoM never smoker followed for asthma, allergic rhinitis, recurrent respiratory infections, Inflammatory bowel disease/ colostomy (Hermansky-Pudlak Syndrome- albinism, colitis, pulmonary fibrosis).Recurrent DVT. Father and wife are here. Follow up to discuss test results.  States o2 sats are staying 88-90% RA during the day. He had an apparent pneumonia in February. Shortness of breath improved with antibiotics been diagnosed with recurrent DVT/PE which is followed by hematology at [facility] with plan to treat with anticoagulation for 3 months. He had a GI bleed. INR has been labile. Now on iron for anemia. In the last few weeks he has felt more short of breath especially with exertion, breathless with speech on room air. Not much effect from his inhalers. He gave disc of his CT scan to pulmonary at [facility]- they have expert on his syndrome. Using the elliptical trainer for exercise, 30 minutes is getting too hard and he has reduced to 10 minute sessions. ONOX- results pending PFT-10/04/2012-mild obstructive airways disease with response to bronchodilator, borderline mild air trapping with increased RV, diffusion severely reduced. FVC 4.30/74%, FEV1 3.45/77%, FEV1/FVC 0.80. TLC 92%, RV 121%, DLCO 40%. CT 4/24 /14  IMPRESSION:  Slightly upper lobe  predominant peribronchovascular fibrosis, as   detailed above. This is new since 07/28/2008.  No evidence of acute superimposed process.  Mild thoracic adenopathy, favored to be reactive.  Original Report Authenticated By: Jeronimo Greaves, M.D.  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 Father and wife are here. FOLLOWS FOR: Feels that his breathing has gotten worse. He was exposed to someone who later developed pneumonia P. Reports DOE while on 3L and minor congestion. Denies chest pain or wheezing. Acute Visit, mostly wanting consultation. Teegan is noticing a decline in oxygen saturation with easier dyspnea on exertion. Sometimes saturation dips to 88% on 3 L and his heartbeat seems rapid. He has been on clobetasol for active Crohn's disease. Currently off Dapsone and 6 MCP. He remains on warfarin after recurrent DVT.  11/11/12-    Father here FOLLOWS FOR: pt states chest congestion has not improved in last 1 week-- would like to discuss perfinidone options-- would also like to discuss lung tx options--should pt stay away from daughter who will get vaccinations tomorrow On Zithromax. Feeling better. Mucinex and water helps clear mucus. Family is passing around cold. He remains on and clobetasol, but has finished prednisone taper. Wife has had strep throat. CXR 10/18/12-  IMPRESSION:  Stable chronic interstitial lung disease/fibrosis.  Original Report Authenticated By: Judie Petit. Shick, M.D.  01/10/13- 30 yoM never smoker followed for asthma, allergic rhinitis, recurrent respiratory infections, Inflammatory bowel disease/ colostomy (Hermansky-Pudlak Syndrome- albinism, colitis, pulmonary fibrosis).Recurrent DVT. FOLLOWS FOR: UIP. Pt states when he tries to take a deep breathe he has chest pains. This has been going on x 1 week. Also reports increase SOB w/ exertion. He is  using 6 liters O2 w/ exertion and 2-3 liters at rest B. [facility] transplant program has told him he must lose 63 pounds, so we can re- refer him at 190 pounds. They also want him off of fentanyl and ativan. He is now working hard at these. He would apparently need a heart and kidney transplant. If he forces deep breaths during exercise he gets diffuse anterior chest pain that seems more related to the deep breath than to the exertion. He has gotten generic Perfenidone/ Perfenex from [place] and is taking 200 mg x 4, TID.    OV 02/19/2013  - switching from Dr Maple Hudson to Dr Marchelle Gearing 89 yoM never smoker followed for asthma, allergic rhinitis, recurrent respiratory infections, Inflammatory bowel disease/ colostomy (Hermansky-Pudlak Syndrome- albinism, colitis, pulmonary fibrosis).Recurrent DVT  Several issues. Overall stable. Now on Pirfenidone from [place].   #Pulmonary Fibrosis - Transplant: - been advised by [facility] that he is overweight and also he needs multi-organ transplant so they rejected his application. He is considering looking at [facility] or [employer]  - Weight loss: he is on a supervised weight hypocaloric weight loss regiment through a ? Surgeon at [employer] through a program called [employer]IF[employer] but he wants a longer term solution. He is afraid to go on green vegetable diet because it will raise his INR  - Treatment:  On pirfenidone. Tolerating well. Wants  cbc, bmet, lft , EKG check today; Qtc 385sec  - Oyxgen need: he wants mask oxygen esp at rehab and at night. Wants special mask and wants [place] do script ft  -Rehab: he wants thresholds liberalized for pulse ox and tachycardia   #Pulmonary EMbolism  - on coumadin. Not eligble for xarelto due to renal insuff.  Unable to go on weight loss with green vegetables due to coumadin intake. Currently feels that weight loss higher priority than clot recurrence due to tranwsplant eligibility. Wants to consider aspirin and lowered  INR  goal t    Review of Systems  Constitutional: Negative for fever and unexpected weight change.  HENT: Negative for ear pain, nosebleeds, congestion, sore throat, rhinorrhea, sneezing, trouble swallowing, dental problem, postnasal drip and sinus pressure.   Eyes: Negative for redness and itching.  Respiratory: Positive for shortness of breath. Negative for cough, chest tightness and wheezing.   Cardiovascular: Negative for palpitations and leg swelling.  Gastrointestinal: Negative for nausea and vomiting.  Genitourinary: Negative for dysuria.  Musculoskeletal: Negative for joint swelling.  Skin: Negative for rash.  Neurological: Negative for headaches.  Hematological: Does not bruise/bleed easily.  Psychiatric/Behavioral: Negative for dysphoric mood. The patient is not nervous/anxious.        Objective:   Physical Exam Filed Vitals:   02/19/13 1505  BP: 120/82  Pulse: 96  Temp: 98.4 F (36.9 C)  TempSrc: Oral  Height: 6\' 1"  (1.854 m)  Weight: 235 lb (106.595 kg)  SpO2: 99%     General- Alert, Oriented, Affect-calm today, Distress- none acute, albino, O2 4L Skin- +Albino. +some ecchymoses on his arms Lymphadenopathy- none Head- atraumatic            Eyes-  PERRLA, conjunctivae and secretions clear. +Strabismus, +nystagmus            Ears- Hearing, canals-normal            Nose- Clear, no-Septal dev, mucus, polyps, erosion, perforation             Throat- Mallampati II , mucosa clear , drainage- none, tonsils- atrophic Neck- flexible , trachea midline, no stridor , thyroid nl, carotid no bruit Chest - symmetrical excursion , unlabored           Heart/CV- RRR , no murmur , no gallop  , no rub, nl s1 s2                           - JVD- none , edema-none, stasis changes- none, varices- none           Lung- + trace crackles heard, wheeze- none, cough-none , dullness-none, rub- none.            Chest wall- ? Tender to pressure sternum Abd- +colostomy Gen/ Rectal- Not done,  not indicated Extrem- cyanosis- none, clubbing, none, atrophy- none, strength- nl.  Neuro- grossly intact to observation except nystagmus        Assessment & Plan:

## 2013-02-19 NOTE — Patient Instructions (Addendum)
#  Pulmonary Fibrosis - Transplant:   - please evaluate [place] or [facility] and let me know which one to refer to - Weight loss: take a look at [facility] low Glycemic sheet; eat only foods in left lane. This is applicable when you switch out of Optifast - Treatment: Continue pirfenidone  - check cbc, bmet, lft today   - check EKG today - Oyxgen need  - see if [name] can do script for oxygen mask that you like -Rehab  - will talk to rehab and liberalize target heart rate to 170/min and lowest oxygen to 86%  - if tachycardia persists despite rehab we can look at seeing cardiology    #Pulmonary EMbolism - start 81mg  per day of aspirin - agree with hematologist of reducing goal INR to > 1.5 - talk to hematologist about alternatives to coumadin because you need to go on green leafy vegetable diet    #Followup  1 monmth

## 2013-02-20 ENCOUNTER — Encounter (HOSPITAL_COMMUNITY)
Admission: RE | Admit: 2013-02-20 | Discharge: 2013-02-20 | Disposition: A | Discharge status: Home / Self Care | Payer: 59 | Source: Ambulatory Visit | Attending: Internal Medicine | Admitting: Internal Medicine

## 2013-02-25 ENCOUNTER — Encounter (HOSPITAL_COMMUNITY): Payer: 59

## 2013-02-26 ENCOUNTER — Other Ambulatory Visit (HOSPITAL_COMMUNITY): Payer: Self-pay | Admitting: Internal Medicine

## 2013-02-26 ENCOUNTER — Ambulatory Visit (HOSPITAL_COMMUNITY)
Admission: RE | Admit: 2013-02-26 | Discharge: 2013-02-26 | Disposition: A | Discharge status: Home / Self Care | Payer: 59 | Source: Ambulatory Visit | Attending: Internal Medicine | Admitting: Internal Medicine

## 2013-02-26 DIAGNOSIS — M7989 Other specified soft tissue disorders: Secondary | ICD-10-CM

## 2013-02-26 DIAGNOSIS — I2699 Other pulmonary embolism without acute cor pulmonale: Secondary | ICD-10-CM | POA: Insufficient documentation

## 2013-02-26 DIAGNOSIS — Z7901 Long term (current) use of anticoagulants: Secondary | ICD-10-CM

## 2013-02-26 NOTE — Progress Notes (Signed)
Bilateral lower extremity venous duplex completed.  Right:  No evidence of DVT, superficial thrombosis, or Baker's cyst.  Left: DVT noted popliteal and distal femoral veins.  No evidence of superficial thrombosis.  No Baker's cyst.

## 2013-02-26 NOTE — Assessment & Plan Note (Signed)
#  Pulmonary EMbolism - start 81mg  per day of aspirin - agree with hematologist of reducing goal INR to > 1.5 - talk to hematologist about alternatives to coumadin because you need to go on green leafy vegetable diet    #Followup  1 monmth

## 2013-02-26 NOTE — Assessment & Plan Note (Addendum)
#  Pulmonary Fibrosis - Transplant:   - please evaluate [place] or [facility] and let me know which one to refer to - Weight loss: take a look at [facility] low Glycemic sheet; eat only foods in left lane. This is applicable when you switch out of Optifast - Treatment: Continue pirfenidone  - check cbc, bmet, lft today   - check EKG today - Oyxgen need  - see if [name] can do script for oxygen mask that you like -Rehab  - will talk to rehab and liberalize target heart rate to 170/min and lowest oxygen to 86%  - if tachycardia persists despite rehab we can look at seeing cardiology    > 50% of this > 45 min visit spent in face to face counseling (30 min vsiti converted to 45 min)

## 2013-02-27 ENCOUNTER — Encounter (HOSPITAL_COMMUNITY)
Admission: RE | Admit: 2013-02-27 | Discharge: 2013-02-27 | Disposition: A | Discharge status: Home / Self Care | Payer: 59 | Source: Ambulatory Visit | Attending: Internal Medicine | Admitting: Internal Medicine

## 2013-02-27 ENCOUNTER — Ambulatory Visit (INDEPENDENT_AMBULATORY_CARE_PROVIDER_SITE_OTHER): Payer: 59 | Admitting: General Practice

## 2013-02-27 DIAGNOSIS — J849 Interstitial pulmonary disease, unspecified: Secondary | ICD-10-CM

## 2013-02-27 DIAGNOSIS — Z7901 Long term (current) use of anticoagulants: Secondary | ICD-10-CM

## 2013-02-27 DIAGNOSIS — J8410 Pulmonary fibrosis, unspecified: Secondary | ICD-10-CM

## 2013-02-27 DIAGNOSIS — I2699 Other pulmonary embolism without acute cor pulmonale: Secondary | ICD-10-CM

## 2013-02-27 DIAGNOSIS — E70331 Hermansky-Pudlak syndrome: Secondary | ICD-10-CM

## 2013-02-27 NOTE — Progress Notes (Signed)
Patient relayed that he is positive for a DVT in left leg at the end of first station of exercise today.  When questioned if it is ok for him to exercise he stated he is being treated by Lovenox and a higher ratio for his INR.  He did not voice any complaints, and vital sign stable.  For the next station patient was asked to walk at a low level.  This conversation re: DVT was prompted by me asking why he had so many bruises on his arms and forearms.  This was when patient told me about the DVT.  He was asked to have his doctor give [place] a note or phone call to make sure it is ok to exercise before returning on Tuesday.  Patient states this is the third DVT he has had in the left leg.  Left leg was noticeably swollen, but patient in no acute distress.

## 2013-03-04 ENCOUNTER — Encounter (HOSPITAL_COMMUNITY): Payer: Self-pay

## 2013-03-04 ENCOUNTER — Encounter (HOSPITAL_COMMUNITY)
Admission: RE | Admit: 2013-03-04 | Discharge: 2013-03-04 | Disposition: A | Discharge status: Home / Self Care | Payer: 59 | Source: Ambulatory Visit | Attending: Internal Medicine | Admitting: Internal Medicine

## 2013-03-04 NOTE — Progress Notes (Signed)
Dr. Rochel Brome nurse called pulmonary rehab today to let us know that he is cleared for exercise.

## 2013-03-06 ENCOUNTER — Telehealth (HOSPITAL_COMMUNITY): Payer: Self-pay | Admitting: Internal Medicine

## 2013-03-06 ENCOUNTER — Encounter (HOSPITAL_COMMUNITY): Payer: 59

## 2013-03-10 ENCOUNTER — Ambulatory Visit: Payer: Self-pay | Admitting: General Practice

## 2013-03-10 DIAGNOSIS — I82409 Acute embolism and thrombosis of unspecified deep veins of unspecified lower extremity: Secondary | ICD-10-CM

## 2013-03-10 DIAGNOSIS — E70331 Hermansky-Pudlak syndrome: Secondary | ICD-10-CM

## 2013-03-10 DIAGNOSIS — I2699 Other pulmonary embolism without acute cor pulmonale: Secondary | ICD-10-CM

## 2013-03-10 DIAGNOSIS — Z7901 Long term (current) use of anticoagulants: Secondary | ICD-10-CM

## 2013-03-10 DIAGNOSIS — J45909 Unspecified asthma, uncomplicated: Secondary | ICD-10-CM

## 2013-03-10 DIAGNOSIS — J849 Interstitial pulmonary disease, unspecified: Secondary | ICD-10-CM

## 2013-03-10 DIAGNOSIS — N189 Chronic kidney disease, unspecified: Secondary | ICD-10-CM

## 2013-03-10 DIAGNOSIS — B356 Tinea cruris: Secondary | ICD-10-CM

## 2013-03-10 LAB — POCT INR: INR: 1.3

## 2013-03-11 ENCOUNTER — Encounter (HOSPITAL_COMMUNITY)
Admission: RE | Admit: 2013-03-11 | Discharge: 2013-03-11 | Disposition: A | Discharge status: Home / Self Care | Payer: 59 | Source: Ambulatory Visit | Attending: Internal Medicine | Admitting: Internal Medicine

## 2013-03-13 ENCOUNTER — Encounter (HOSPITAL_COMMUNITY)
Admission: RE | Admit: 2013-03-13 | Discharge: 2013-03-13 | Disposition: A | Discharge status: Home / Self Care | Payer: 59 | Source: Ambulatory Visit | Attending: Internal Medicine | Admitting: Internal Medicine

## 2013-03-13 DIAGNOSIS — Z86718 Personal history of other venous thrombosis and embolism: Secondary | ICD-10-CM | POA: Insufficient documentation

## 2013-03-13 DIAGNOSIS — Z79899 Other long term (current) drug therapy: Secondary | ICD-10-CM | POA: Insufficient documentation

## 2013-03-13 DIAGNOSIS — Z5189 Encounter for other specified aftercare: Secondary | ICD-10-CM | POA: Insufficient documentation

## 2013-03-13 DIAGNOSIS — J45909 Unspecified asthma, uncomplicated: Secondary | ICD-10-CM | POA: Insufficient documentation

## 2013-03-18 ENCOUNTER — Encounter (HOSPITAL_COMMUNITY)
Admission: RE | Admit: 2013-03-18 | Discharge: 2013-03-18 | Disposition: A | Discharge status: Home / Self Care | Payer: 59 | Source: Ambulatory Visit | Attending: Internal Medicine | Admitting: Internal Medicine

## 2013-03-18 ENCOUNTER — Ambulatory Visit (INDEPENDENT_AMBULATORY_CARE_PROVIDER_SITE_OTHER): Payer: 59 | Admitting: General Practice

## 2013-03-18 DIAGNOSIS — I2699 Other pulmonary embolism without acute cor pulmonale: Secondary | ICD-10-CM

## 2013-03-18 DIAGNOSIS — E70331 Hermansky-Pudlak syndrome: Secondary | ICD-10-CM

## 2013-03-18 DIAGNOSIS — E7089 Other disorders of aromatic amino-acid metabolism: Secondary | ICD-10-CM

## 2013-03-18 DIAGNOSIS — Z7901 Long term (current) use of anticoagulants: Secondary | ICD-10-CM

## 2013-03-18 DIAGNOSIS — J849 Interstitial pulmonary disease, unspecified: Secondary | ICD-10-CM

## 2013-03-18 DIAGNOSIS — J8410 Pulmonary fibrosis, unspecified: Secondary | ICD-10-CM

## 2013-03-18 LAB — POCT INR: INR: 1.5

## 2013-03-20 ENCOUNTER — Encounter (HOSPITAL_COMMUNITY): Payer: 59

## 2013-03-21 ENCOUNTER — Other Ambulatory Visit: Payer: Self-pay | Admitting: General Practice

## 2013-03-21 ENCOUNTER — Other Ambulatory Visit: Payer: Self-pay | Admitting: Internal Medicine

## 2013-03-21 MED ORDER — WARFARIN SODIUM 2.5 MG PO TABS: ORAL_TABLET | ORAL | Status: DC

## 2013-03-24 ENCOUNTER — Ambulatory Visit: Payer: 59 | Admitting: General Practice

## 2013-03-24 ENCOUNTER — Telehealth: Payer: Self-pay | Admitting: Internal Medicine

## 2013-03-24 ENCOUNTER — Ambulatory Visit (INDEPENDENT_AMBULATORY_CARE_PROVIDER_SITE_OTHER): Payer: 59 | Admitting: Internal Medicine

## 2013-03-24 ENCOUNTER — Encounter: Payer: Self-pay | Admitting: Internal Medicine

## 2013-03-24 ENCOUNTER — Other Ambulatory Visit (INDEPENDENT_AMBULATORY_CARE_PROVIDER_SITE_OTHER): Payer: 59

## 2013-03-24 VITALS — BP 122/70 | HR 89 | Ht 73.0 in | Wt 203.0 lb

## 2013-03-24 DIAGNOSIS — J84112 Idiopathic pulmonary fibrosis: Secondary | ICD-10-CM

## 2013-03-24 DIAGNOSIS — I2699 Other pulmonary embolism without acute cor pulmonale: Secondary | ICD-10-CM

## 2013-03-24 DIAGNOSIS — K5100 Ulcerative (chronic) pancolitis without complications: Secondary | ICD-10-CM

## 2013-03-24 DIAGNOSIS — Z7901 Long term (current) use of anticoagulants: Secondary | ICD-10-CM

## 2013-03-24 DIAGNOSIS — J849 Interstitial pulmonary disease, unspecified: Secondary | ICD-10-CM

## 2013-03-24 DIAGNOSIS — J8410 Pulmonary fibrosis, unspecified: Secondary | ICD-10-CM

## 2013-03-24 DIAGNOSIS — E70331 Hermansky-Pudlak syndrome: Secondary | ICD-10-CM

## 2013-03-24 LAB — BASIC METABOLIC PANEL
BUN: 28 mg/dL — ABNORMAL HIGH (ref 6–23)
Calcium: 9.2 mg/dL (ref 8.4–10.5)
Creatinine, Ser: 1.7 mg/dL — ABNORMAL HIGH (ref 0.4–1.5)
GFR: 49.01 mL/min — ABNORMAL LOW (ref >60.00)

## 2013-03-24 LAB — HEPATIC FUNCTION PANEL
ALT: 46 U/L (ref 0–53)
Alkaline Phosphatase: 90 U/L (ref 39–117)
Bilirubin, Direct: 0.1 mg/dL (ref 0.0–0.3)
Total Protein: 7.8 g/dL (ref 6.0–8.3)

## 2013-03-24 LAB — CBC
Hemoglobin: 12.3 g/dL — ABNORMAL LOW (ref 13.0–17.0)
Platelets: 253 10*3/uL (ref 150.0–400.0)
RBC: 4.51 Mil/uL (ref 4.22–5.81)

## 2013-03-24 LAB — POCT INR: INR: 1.5

## 2013-03-24 MED ORDER — HYDROCOD POLST-CHLORPHEN POLST 10-8 MG/5ML PO LQCR: 5.0000 mL | Freq: Two times a day (BID) | ORAL | Status: DC | PRN

## 2013-03-24 MED ORDER — HYDROCOD POLST-CHLORPHEN POLST 10-8 MG/5ML PO LQCR
5.0000 mL | Freq: Two times a day (BID) | ORAL | Status: DC | PRN
Start: 2013-03-24 — End: 2013-03-24

## 2013-03-24 NOTE — Telephone Encounter (Signed)
I spoke with pt. He reports he was told he was going to fill out a handicap placard but then forgot to get it. He would like this mailed to him. Confirmed mailing. Address. Please advise MR if this is okay? thanks

## 2013-03-24 NOTE — Progress Notes (Signed)
Subjective:    Patient ID: Asa Saunas, male    DOB: 1980-11-12, 32 y.o.   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   Here with friend [name] Last here August 09, 2010 Currently says breathing is pretty good. Had temp 100 degrees x 1 week, sore throat, tender left cervical node. Had dieted and lost 40 lbs since off prednisone x 6 months. Now on 6-mercaptopurine (which drops WBC to 3000) and Remicade. These drugs are managed by Dr Sherrill/ Heme Onc and Dr Schooler/ GI. Denies wheeze, phlegm. Took oxycodone this AM for pains. For the past year has had some dry cough. Concerned about this as an early fibrosis marker. Wants PFT every 6 months. He plans to go( unscheduled so far) for CXR and BAL at the [facility] so he asks not to do a cxr here so as to minimize radiation exposure.     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   Since last here he had a total colectomy in 2012. Had ER visit at [facility] 03/24/2011 and CT scan suggested "something in lung" which he wanted to review. Treated with Levaquin. Has also had ER visit for kidney stone. Has stage III renal disease with 20% function-not known why. CXR 11/26/11- he reports "much clearer". Images were available on discs he brought [facility]. We reviewed the images together. It looks as if he was dealing with a pneumonia in October with some residual density-pneumonia versus atelectasis-on the recent chest x-ray. He has minimal cough and no acute chest symptoms. Some of his interstitial markings may have been a little prominent but there was no dramatic interstitial fibrosis.  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 ACUTE VISIT: about 1 week or so ago started having cough, congestion, and had stomach flu over the weekend; noticed increase in mucus-started abx yesterday and has reduced the mucus amount. Major concern was breathing issues-felt unable to get breath and go any further. Noiced wheezing as well.We had called in augmentin yesterday to start because of his concern he was getting pneumonia. Describes 2 weeks of increased chest congestion and chest tightness but very productive cough. GI viral gastroenteritis pattern over this weekend seems to be a different problem. We had called in Augmentin which further aggravated loose stools through his colostomy. He is not on a routine probiotic. Today feeling better. Denies fever or sore throat.  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 ACUTE VISIT: SOB increased and yellow productive cough, labored breathing when talking,etc. Feelings of drowning and passing out; was given Doxycycline and Cipro We had stopped Augmentin at last visit because of watery diarrhea attributed to the antibiotic, although it may have been a separate viral gastroenteritis. He now says he chose to continue the Augmentin. We were closed first no last week he called the on-call doctor to get prescriptions for doxycycline and Cipro. Prednisone already being taken for his colitis was increased to 40 mg daily for wheezing. Cough was very productive. Today he asks he worked in, reporting more shortness of breath in the last few days "scary". Using rescue inhaler and resumed Symbicort  for modest benefit. He notices some wheezing lying down but not when he is upright. Arthralgias in   knees, no rash, fever or purulent sputum. He had past history of DVT complicating his colectomy by report. CXR 07/19/12- IMPRESSION:  Central peribronchial thickening, probably indicative of  bronchitis. No frank edema or consolidation.  Original Report Authenticated By: Bretta Bang, M.D.  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 FOLLOWS FOR: was told had blood clot and currently on coumadin now; would like to discuss coming off of it if possible.  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 Lovenox/warfarin lead to rectal bleeding-now on warfarin alone. Followed by hematologist at [facility]. Question this is an old clot -he did have DVT after previous surgery- but probably new/recurrent. No hemoptysis. He feels pneumonia resolved. Chest feels well. Home health nurse following INR. CXR 08/14/12 IMPRESSION:  Persistent patchy opacities in the left upper lobe and right base  dating back to 07/16/2012. Findings likely reflect residual changes  of pneumonia or potentially chronic underlying scarring/fibrosis.  Recommend additional repeat chest x-ray in 2 - 4 weeks. If there  is still no interval change, further evaluation with CT scan of the  chest would likely be warranted to further evaluate.  Original Report Authenticated By: Malachy Moan, M.D.  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 ACUTE VISIT: having trouble breathing x 2 weeks(happens with minor exertion) denies any wheezing, cough, or congestion. He started Zyrtec 2 weeks ago. Not using Symbicort "just too many meds" and not wheezing. He notes easy dyspnea on exertion  across a room or a telephone. Denies phlegm or cough, fever, sweats or chills. Feet are not swelling. We discussed anemia given his history of GI bleed. CXR 09/04/12 IMPRESSION:  Persistent streaky airspace/infiltrates bilateral upper and lower  lobe with central distribution. The findings probable due to  persistent pneumonia superimposed on chronic interstitial lung  disease. Slight worsening in aeration right upper lobe. Follow-up  to resolution is recommended. Further evaluation with CT of the  chest could be performed as clinically warranted.  Original Report Authenticated By: Natasha Mead, M.D.  10/08/12- 82 yoM never smoker followed for asthma, allergic rhinitis, recurrent respiratory infections, Inflammatory bowel disease/ colostomy (Hermansky-Pudlak Syndrome- albinism, colitis, pulmonary fibrosis).Recurrent DVT. Father and wife are here. Follow up to discuss test results.  States o2 sats are staying 88-90% RA during the day. He had an apparent pneumonia in February. Shortness of breath improved with antibiotics been diagnosed with recurrent DVT/PE which is followed by hematology at [facility] with plan to treat with anticoagulation for 3 months. He had a GI bleed. INR has been labile. Now on iron for anemia. In the last few weeks he has felt more short of breath especially with exertion, breathless with speech on room air. Not much effect from his inhalers. He gave disc of his CT scan to pulmonary at [facility]- they have expert on his syndrome. Using the elliptical trainer for exercise, 30 minutes is getting too hard and he has reduced to 10 minute sessions. ONOX- results pending PFT-10/04/2012-mild obstructive airways disease with response to bronchodilator, borderline mild air trapping with increased RV, diffusion severely reduced. FVC 4.30/74%, FEV1 3.45/77%, FEV1/FVC 0.80. TLC 92%, RV 121%, DLCO 40%. CT 4/24 /14  IMPRESSION:  Slightly upper lobe  predominant peribronchovascular fibrosis, as   detailed above. This is new since 07/28/2008.  No evidence of acute superimposed process.  Mild thoracic adenopathy, favored to be reactive.  Original Report Authenticated By: Jeronimo Greaves, M.D.  10/18/12- 56 yoM never smoker followed for asthma, allergic rhinitis, recurrent respiratory infections, Inflammatory bowel disease/ colostomy (Hermansky-Pudlak Syndrome- albinism, colitis, pulmonary fibrosis).Recurrent DVT. Father and wife are here. FOLLOWS FOR: Feels that his breathing has gotten worse. He was exposed to someone who later developed pneumonia P. Reports DOE while on 3L and minor congestion. Denies chest pain or wheezing. Acute Visit, mostly wanting consultation. Alvester is noticing a decline in oxygen saturation with easier dyspnea on exertion. Sometimes saturation dips to 88% on 3 L and his heartbeat seems rapid. He has been on clobetasol for active Crohn's disease. Currently off Dapsone and 6 MCP. He remains on warfarin after recurrent DVT.  11/11/12-    Father here FOLLOWS FOR: pt states chest congestion has not improved in last 1 week-- would like to discuss perfinidone options-- would also like to discuss lung tx options--should pt stay away from daughter who will get vaccinations tomorrow On Zithromax. Feeling better. Mucinex and water helps clear mucus. Family is passing around cold. He remains on and clobetasol, but has finished prednisone taper. Wife has had strep throat. CXR 10/18/12-  IMPRESSION:  Stable chronic interstitial lung disease/fibrosis.  Original Report Authenticated By: Judie Petit. Shick, M.D.  01/10/13- 69 yoM never smoker followed for asthma, allergic rhinitis, recurrent respiratory infections, Inflammatory bowel disease/ colostomy (Hermansky-Pudlak Syndrome- albinism, colitis, pulmonary fibrosis).Recurrent DVT. FOLLOWS FOR: UIP. Pt states when he tries to take a deep breathe he has chest pains. This has been going on x 1 week. Also reports increase SOB w/ exertion. He is  using 6 liters O2 w/ exertion and 2-3 liters at rest B. [facility] transplant program has told him he must lose 63 pounds, so we can re- refer him at 190 pounds. They also want him off of fentanyl and ativan. He is now working hard at these. He would apparently need a heart and kidney transplant. If he forces deep breaths during exercise he gets diffuse anterior chest pain that seems more related to the deep breath than to the exertion. He has gotten generic Perfenidone/ Perfenex from [place] and is taking 200 mg x 4, TID.    OV 02/19/2013  - switching from Dr Maple Hudson to Dr Marchelle Gearing 52 yoM never smoker followed for asthma, allergic rhinitis, recurrent respiratory infections, Inflammatory bowel disease/ colostomy (Hermansky-Pudlak Syndrome- albinism, colitis, pulmonary fibrosis).Recurrent DVT  Several issues. Overall stable. Now on Pirfenidone from [place].   #Pulmonary Fibrosis - Transplant: - been advised by [facility] that he is overweight and also he needs multi-organ transplant so they rejected his application. He is considering looking at [employer] or [employer]  - Weight loss: he is on a supervised weight hypocaloric weight loss regiment through a ? Surgeon at [employer] through a program called [employer] but he wants a longer term solution. He is afraid to go on green vegetable diet because it will raise his INR  - Treatment:  On pirfenidone. Tolerating well. Wants  cbc, bmet, lft , EKG check today; Qtc 385sec  - Oyxgen need: he wants mask oxygen esp at rehab and at night. Wants special mask and wants [place] do script ft  -Rehab: he wants thresholds liberalized for pulse ox and tachycardia   #Pulmonary EMbolism  - on coumadin. Not eligble for xarelto due to renal insuff.  Unable to go on weight loss with green vegetables due to coumadin intake. Currently feels that weight loss higher priority than clot recurrence due to tranwsplant eligibility. Wants to consider aspirin and lowered  INR goal   OV  03/24/2013 Body mass index is 26.79 kg/(m^2). 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 Cotinues pirfenidone from [place]. No gI side effect. LAst visit LFT okay. Will have LFT check this visit  Crohns - appears to be actin up. 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 Has liver doc of [facility]. Apparently plans to stat 6MP/Simponi. Wants to know if ok to start with prifenidone. I checked with [employer]; they have no data on this. Dryg too new for experience on this issues. He is not on bactrim prophyalsis  PE - he is on coumadin. I think his INR goal is > 1.5 now. HE is yet to start baby aspirin for prophylasix  Obesity: has lost lot of weight   Review of Systems  Constitutional: Negative for fever and unexpected weight change.  HENT: Negative for congestion, dental problem, ear pain, nosebleeds, postnasal drip, rhinorrhea, sinus pressure, sneezing, sore throat and trouble swallowing.   Eyes: Negative for redness and itching.  Respiratory: Negative for cough, chest tightness, shortness of breath and wheezing.   Cardiovascular: Negative for palpitations and leg swelling.  Gastrointestinal: Negative for nausea and vomiting.  Genitourinary: Negative for dysuria.  Musculoskeletal: Negative for joint swelling.  Skin: Negative for rash.  Neurological: Negative for headaches.  Hematological: Does not bruise/bleed easily.  Psychiatric/Behavioral: Negative for dysphoric mood. The patient is not nervous/anxious.       Past, Family, Social reviewed: no change since last visit other thant crohn worse  Objective:   Physical Exam Filed Vitals:   03/24/13 1224  BP: 122/70  Pulse: 89  Height: 6\' 1"  (1.854 m)  Weight: 203 lb (92.08 kg)  SpO2: 96%       General- Alert, Oriented, Affect-calm today, Distress- none acute, albino, O2 4L Skin- +Albino. +some ecchymoses on his  arms Lymphadenopathy- none Head- atraumatic            Eyes-  PERRLA, conjunctivae and secretions clear. +Strabismus, +nystagmus            Ears- Hearing, canals-normal            Nose- Clear, no-Septal dev, mucus, polyps, erosion, perforation             Throat- Mallampati II , mucosa clear , drainage- none, tonsils- atrophic Neck- flexible , trachea midline, no stridor , thyroid nl, carotid no bruit Chest - symmetrical excursion , unlabored           Heart/CV- RRR , no murmur , no gallop  , no rub, nl s1 s2                           - JVD- none , edema-none, stasis changes- none, varices- none           Lung- + trace crackles heard, wheeze- none, cough-none , dullness-none, rub- none.            Chest wall- ? Tender to pressure sternum Abd- +colostomy Gen/ Rectal- Not done, not indicated Extrem- cyanosis- none, clubbing, none, atrophy- none, strength- nl.  Neuro- grossly intact to observation except nystagmus          Assessment &  Plan:

## 2013-03-24 NOTE — Telephone Encounter (Signed)
Yeah that is fine  Dr. Kalman Shan, M.D., [facility].C.P Pulmonary and Critical Care Medicine Staff Physician [place] [facility] [place] [facility] and Critical [facility] Pager: [phone], If no answer or between  15:00h - 7:00h: call [phone]  03/24/2013 8:05 PM

## 2013-03-24 NOTE — Patient Instructions (Addendum)
#  Pulmonary Fibrosis - glad you have lost weight - glad rehab working well for you - continue pirfenidone  - check blood work today  - will check with [employer] about potential drug interaction with pirfenidone and 6MP/Simponi - continue oxygen - try tussionex for cough  #Pulmonary EMbolism - start 81mg  per day of aspirin - continue couamdin: IS your INR goal now  > 1.5  #Crohn's  - talk to your liver doc about taking bactrim prohlyaxis to prevent lung infection. IF he is ok with it, I will prescribe it  - give your liver doc name to my CMA, will fax note to him   #Followup -  6 weeks

## 2013-03-25 ENCOUNTER — Encounter (HOSPITAL_COMMUNITY)
Admission: RE | Admit: 2013-03-25 | Discharge: 2013-03-25 | Disposition: A | Discharge status: Home / Self Care | Payer: 59 | Source: Ambulatory Visit | Attending: Internal Medicine | Admitting: Internal Medicine

## 2013-03-25 NOTE — Telephone Encounter (Signed)
Placard placed in the mail. Carron Curie, CMA

## 2013-03-25 NOTE — Telephone Encounter (Signed)
Handicap placard given to Jenn - Please have MR check the indication for this and sign.  Thank you!  lmomtcb for pt to inform him placard will be placed in mail today.

## 2013-03-27 ENCOUNTER — Encounter: Payer: Self-pay | Admitting: *Deleted

## 2013-03-27 ENCOUNTER — Telehealth (HOSPITAL_COMMUNITY): Payer: Self-pay | Admitting: Internal Medicine

## 2013-03-27 ENCOUNTER — Encounter (HOSPITAL_COMMUNITY): Payer: 59

## 2013-04-01 ENCOUNTER — Encounter (HOSPITAL_COMMUNITY)
Admission: RE | Admit: 2013-04-01 | Discharge: 2013-04-01 | Disposition: A | Discharge status: Home / Self Care | Payer: 59 | Source: Ambulatory Visit | Attending: Internal Medicine | Admitting: Internal Medicine

## 2013-04-01 NOTE — Progress Notes (Signed)
[name] 32 y.o. male Nutrition Note Spoke with pt. Pt is obese. Pt wt is 103.9 kg (228.6 lb) today, which is down 7.8 kg (17.2 lb) over the past 2 months. Pt is following the [employer] weight loss program. Pt states he has to be 200 lb to talk with the transplant team and 190 lb to be listed on the transplant list. Per pt, "I've veered off of it a little bit recently due to my skin Crohn's." Per discussion, pt was sleeping all day and up all night with skin Crohn's, which changed his eating schedule. Pt tries to follow [employer]'s recommendation to eat something every 3 hours. Pt reports eating a lean meat/fish and salad or steamed/sauteed vegetable at dinner. Pt making healthy food choices the majority of the time according to pt's Rate Your Plate results.  Pt's Rate Your Plate results reviewed with pt.  Pt expressed understanding.  Pt avoids most salty foods.  Pt is adding salt to food "to give the food flavor since we cook differently now." Alternatives to using salt discussed. Pt reports having tried many of the alternatives suggested.  The role of sodium in lung disease reviewed with pt. Nutrition Diagnosis   Food-and nutrition-related knowledge deficit related to lack of exposure to information as related to diagnosis of pulmonary disease   Obesity related to excessive energy intake as evidenced by a BMI of 33.4 Nutrition Rx/Est. Daily Nutrition Needs for: ? wt loss 1950-2200 Kcal  135-165 gm protein   2000 mg or less sodium      Nutrition Intervention   Pt's individual nutrition plan and goals reviewed with pt.   Benefits of adopting healthy eating habits discussed when pt's Rate Your Plate reviewed.   Pt to attend the Nutrition and Lung Disease class - met 03/13/13   Continual client-centered nutrition education by RD, as part of interdisciplinary care. Goal(s) 1. Identify food quantities necessary to achieve wt loss of  -2# per week to a goal wt of 100.8-109 kg (222-240 lb) at  graduation from pulmonary rehab. Monitor and Evaluate progress toward nutrition goal with team.   Mickle Plumb, M.Ed, RD, LDN, CDE 04/02/2013 7:52 AM

## 2013-04-02 ENCOUNTER — Telehealth: Payer: Self-pay | Admitting: Internal Medicine

## 2013-04-02 NOTE — Telephone Encounter (Signed)
I spoke with pt. He reports on 03/24/13 he was giving 2 rx's for tussionex. He picked RX up same day. One for that day and then one post dated 04/24/13. Both were for 300 ML's. He is taking this daily before exercise. He reports he has 1 week left of the tussionex so he will run out before the post dated RX. He is asking if he can get a refill on this medication bc he will run out in 1 week. Since MR is no where on schedule and worked 11-7. I spoke with Florentina Addison and advised her sending this to [facility] since he is familiar with pt.   FOR MR: Also he stated he started Simponi last wed. He was told by MR he would need to speak with another physician abouth if pt can start bactrim. He is aware MR is out of the office and can be addressed when he returns regarding this matter.   Please advise Dr. Maple Hudson thanks

## 2013-04-02 NOTE — Telephone Encounter (Signed)
Spoke to pt. States that he has not received the handicap placard in the mail. According to our documentation, this was mailed on 03/25/13.  Pt requests for [place] to just fill out another one for him. Advised him that MR will not be back in to the office until 04/23/13. He states that this is not an emergency but would like to get this some time soon.  I will send this message to MR to see if he could come by the office one afternoon to sign this.

## 2013-04-02 NOTE — Telephone Encounter (Signed)
He is using the tussionex at much higher rate than described. No refill on this controlled med until he discusses w/ Dr Colletta [place].

## 2013-04-02 NOTE — Telephone Encounter (Signed)
lmomtcb x1 Will also forward to MR

## 2013-04-03 ENCOUNTER — Telehealth: Payer: Self-pay | Admitting: *Deleted

## 2013-04-03 ENCOUNTER — Encounter (HOSPITAL_COMMUNITY): Payer: 59

## 2013-04-03 NOTE — Telephone Encounter (Signed)
Left detailed message on Suzannes VM

## 2013-04-03 NOTE — Telephone Encounter (Signed)
I have not seem him in a long long time so I can't re-certify this

## 2013-04-03 NOTE — Telephone Encounter (Signed)
Rosalita Chessman called requesting orders to continue [facility] visits and to re certify pt once more.  Please advise

## 2013-04-04 ENCOUNTER — Ambulatory Visit: Payer: 59 | Admitting: Family Medicine

## 2013-04-04 DIAGNOSIS — J849 Interstitial pulmonary disease, unspecified: Secondary | ICD-10-CM

## 2013-04-04 DIAGNOSIS — E70331 Hermansky-Pudlak syndrome: Secondary | ICD-10-CM

## 2013-04-04 DIAGNOSIS — Z7901 Long term (current) use of anticoagulants: Secondary | ICD-10-CM

## 2013-04-04 DIAGNOSIS — I2699 Other pulmonary embolism without acute cor pulmonale: Secondary | ICD-10-CM

## 2013-04-04 NOTE — Telephone Encounter (Signed)
Spoke with patient--aware that MR out of office and will not return until 04/23/13.   Will send message to MR to follow up on ASAP--pt requests that if anything, please address the Bactrim question ASAP

## 2013-04-04 NOTE — Assessment & Plan Note (Signed)
#  Pulmonary Fibrosis - glad you have lost weight - glad rehab working well for you - continue pirfenidone  - check blood work today  - will check with [employer] about potential drug interaction with pirfenidone and 6MP/Simponi - continue oxygen - try tussionex for cough #Followup -  6 weeks

## 2013-04-04 NOTE — Telephone Encounter (Signed)
Spoke with patient--aware that MR out of office and will not return until 04/23/13.   Will send message to MR to follow up on ASAP

## 2013-04-04 NOTE — Telephone Encounter (Signed)
Pt is calling back asking to speak w/ MR's nurse.  I will also document on other open message.  Pt can be reached at [phone].    [name]

## 2013-04-04 NOTE — Telephone Encounter (Signed)
REgarding   Pirfenidone - 6MP/Simponi interaction - to date we do not know. There is no data on this. [employer] got back and told me they have no experience on this  Regarding bactrim: I wanted him to talk to his GI doc who was starting him  On 6MP/SImponi to see if he needed bactrim prophylaxis.   Regarding tussionex: only 5mL bid  Thanks  Dr. Kalman Shan, M.D., [facility]C.P Pulmonary and Critical Care Medicine Staff Physician [place] [facility] [place] Pulmonary and Critical Care Pager: [phone], If no answer or between  15:00h - 7:00h: call [phone]  04/04/2013 11:15 PM

## 2013-04-04 NOTE — Assessment & Plan Note (Signed)
#  Pulmonary EMbolism - start 81mg  per day of aspirin - continue couamdin: IS your INR goal now  > 1.5

## 2013-04-04 NOTE — Assessment & Plan Note (Signed)
#  Crohn's  - talk to your liver doc about taking bactrim prohlyaxis to prevent lung infection. IF he is ok with it, I will prescribe it  - give your liver doc name to my CMA, will fax note to him

## 2013-04-04 NOTE — Telephone Encounter (Signed)
Pt has called back & is asking to speak w/ MR's nurse & can be reached at [phone].  Antionette Fairy

## 2013-04-07 ENCOUNTER — Encounter: Payer: Self-pay | Admitting: Internal Medicine

## 2013-04-07 NOTE — Telephone Encounter (Signed)
Returning call can be reached at [phone].Raylene Everts

## 2013-04-07 NOTE — Telephone Encounter (Addendum)
I spoke with the pt and advised of the recs below. As for the tussionex, the pt states that he measured incorrectly a few times before he was aware he was doing this so that is why he will run out too soon. He states he has not run out yet. He states he will calculate how much he has left and let us know via mychart. He states he takes the cough syrup before exercise because this causes hi cough. He states he has to exercise for rehab for his lung transplant. He states he made a mistake and will be very careful to not make it again. He requests that MR call him to discuss if he has any questions. He states he cannot exercise without this medication and he cannot miss the exercise due to the transplant program. He has an rx that can be filled on 04-24-13. He is asking for an rx for the amount he needs to last until then. He states he has 40ml left and he uses it up to twice a day, 5ml each time, so that gives him 8 days of the medication left. So he will be without the mediation x 9 days until he can get the new rx filled.  Please advise. Carron Curie, CMA

## 2013-04-07 NOTE — Telephone Encounter (Signed)
I have placed this info in phone note to MR. Pt is aware. Carron Curie, CMA

## 2013-04-07 NOTE — Telephone Encounter (Signed)
[id number].Jennifer Castillo, CMA  

## 2013-04-07 NOTE — Telephone Encounter (Signed)
reffill tusssionex to 5ml Bid, he is on clock for it next refill  DL placard signed and on my computer on wheels  Dr. Kalman Shan, M.D., [facility].C.P Pulmonary and Critical Care Medicine Staff Physician [facility] [place] Pulmonary and Critical Care Pager: [phone], If no answer or between  15:00h - 7:00h: call [phone]  04/07/2013 6:19 PM

## 2013-04-07 NOTE — Telephone Encounter (Signed)
Pt was not calling back on this message, see other phone note. Please advise MR when placard is signed so I can re-send this. Carron Curie, CMA

## 2013-04-08 ENCOUNTER — Encounter (HOSPITAL_COMMUNITY)
Admission: RE | Admit: 2013-04-08 | Discharge: 2013-04-08 | Disposition: A | Discharge status: Home / Self Care | Payer: 59 | Source: Ambulatory Visit | Attending: Internal Medicine | Admitting: Internal Medicine

## 2013-04-08 NOTE — Telephone Encounter (Signed)
LMTCBx1 to advise the pt. I have the placard in triage. I want to see if the pt wants rx mailed or pick-up.  Carron Curie, CMA

## 2013-04-08 NOTE — Telephone Encounter (Signed)
See other phone note. Carron Curie, CMA

## 2013-04-09 MED ORDER — HYDROCOD POLST-CHLORPHEN POLST 10-8 MG/5ML PO LQCR: 5.0000 mL | Freq: Two times a day (BID) | ORAL | Status: DC | PRN

## 2013-04-09 NOTE — Telephone Encounter (Signed)
Patient needs appt with TP for Med Mgmt-- No slots avail soon with TP. Jessica please advise where pt can be placed soon on TP schedule. Thanks.

## 2013-04-09 NOTE — Telephone Encounter (Signed)
Mr is not in the office this week. Dr. Sherene Sires are you ok to sign the rx for a 14 day supply of tussionex? Pt has to pick-up rx. Thanks. Carron Curie, CMA

## 2013-04-09 NOTE — Telephone Encounter (Signed)
Pt to come by and pick up Rx tomorrow.  Rx has been placed up front.

## 2013-04-09 NOTE — Telephone Encounter (Signed)
I have sent a message to MR to see if he can come by and sign rx. Carron Curie, CMA

## 2013-04-09 NOTE — Telephone Encounter (Signed)
Strongly prefer ov with Tammy or me with all meds

## 2013-04-09 NOTE — Telephone Encounter (Signed)
LMTCB to make sure that pt wants rx mailed. Carron Curie, CMA

## 2013-04-10 ENCOUNTER — Other Ambulatory Visit (INDEPENDENT_AMBULATORY_CARE_PROVIDER_SITE_OTHER): Payer: 59

## 2013-04-10 ENCOUNTER — Encounter: Payer: Self-pay | Admitting: Internal Medicine

## 2013-04-10 ENCOUNTER — Ambulatory Visit (INDEPENDENT_AMBULATORY_CARE_PROVIDER_SITE_OTHER): Payer: 59 | Admitting: Internal Medicine

## 2013-04-10 ENCOUNTER — Encounter (HOSPITAL_COMMUNITY): Payer: 59

## 2013-04-10 VITALS — BP 130/86 | HR 111 | Temp 98.5°F | Resp 20 | Ht 72.0 in | Wt 229.0 lb

## 2013-04-10 DIAGNOSIS — Z7901 Long term (current) use of anticoagulants: Secondary | ICD-10-CM

## 2013-04-10 DIAGNOSIS — Z0000 Encounter for general adult medical examination without abnormal findings: Secondary | ICD-10-CM

## 2013-04-10 DIAGNOSIS — D899 Disorder involving the immune mechanism, unspecified: Secondary | ICD-10-CM

## 2013-04-10 LAB — LIPID PANEL: Total CHOL/HDL Ratio: 3

## 2013-04-10 NOTE — Assessment & Plan Note (Signed)
At his request, I will check a Quantiferon TB Gold test and will send the results to [facility] (Dr. Erma Heritage)

## 2013-04-10 NOTE — Assessment & Plan Note (Signed)
At his request I will check his PT-INR today

## 2013-04-10 NOTE — Assessment & Plan Note (Signed)
Exam done Vaccines were reviewed Will check his FLP Pt ed material was given

## 2013-04-10 NOTE — Progress Notes (Signed)
Subjective:    Patient ID: [name], male    DOB: Sep 19, 1980, 32 y.o.   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   Review of Systems  Constitutional: Positive for fatigue. Negative for fever, chills, diaphoresis, appetite change and unexpected weight change.  HENT: Negative.   Eyes: Negative.   Respiratory: Positive for shortness of breath. Negative for apnea, cough, choking, chest tightness, wheezing and stridor.   Cardiovascular: Negative.  Negative for chest pain, palpitations and leg swelling.  Gastrointestinal: Negative.  Negative for nausea, vomiting, abdominal pain, diarrhea, constipation and blood in stool.  Endocrine: Negative.   Genitourinary: Negative.   Musculoskeletal: Positive for arthralgias and myalgias. Negative for back pain, gait problem, joint swelling, neck pain and neck stiffness.  Skin: Positive for wound (ulcers related to crohn's).  Allergic/Immunologic: Negative.   Neurological: Negative.   Hematological: Negative.  Negative for adenopathy. Does not bruise/bleed easily.  Psychiatric/Behavioral: Negative.        Objective:   Physical Exam  Vitals reviewed. Constitutional: He is oriented to person, place, and time. He appears well-developed and well-nourished. No distress.  HENT:  Head: Normocephalic and atraumatic.  Mouth/Throat: Oropharynx is clear and moist. No oropharyngeal exudate.  Eyes: Conjunctivae are normal. Right eye exhibits no discharge. Left eye exhibits no discharge. No scleral icterus.  Neck: Normal range of motion. Neck supple. No JVD present. No tracheal deviation present. No thyromegaly present.  Cardiovascular: Normal rate, regular rhythm, normal heart sounds and intact distal pulses.  Exam reveals no gallop.   No murmur heard. Pulmonary/Chest: Effort normal and breath sounds normal.  No stridor. No respiratory distress. He has no wheezes. He has no rales. He exhibits no tenderness.  Abdominal: Soft. Bowel sounds are normal. He exhibits no distension and no mass. There is no tenderness. There is no rebound and no guarding. Hernia confirmed negative in the right inguinal area and confirmed negative in the left inguinal area.  Genitourinary: Testes normal and penis normal.    Right testis shows no mass, no swelling and no tenderness. Right testis is descended. Left testis shows no mass, no swelling and no tenderness. Left testis is descended. Circumcised. No penile erythema or penile tenderness. No discharge found.  Musculoskeletal: Normal range of motion. He exhibits edema (trace edema in BLE). He exhibits no tenderness.  Lymphadenopathy:    He has no cervical adenopathy.       Right: No inguinal adenopathy present.       Left: No inguinal adenopathy present.  Neurological: He is oriented to person, place, and time.  Skin: Skin is warm and dry. No rash noted. He is not diaphoretic. No erythema. No pallor.  Psychiatric: He has a normal mood and affect. His behavior is normal. Judgment and thought content normal.      Lab Results  Component Value Date   WBC 4.9 03/24/2013   HGB 12.3* 03/24/2013   HCT 37.5* 03/24/2013   PLT 253.0 03/24/2013   GLUCOSE 146* 03/24/2013   ALT 46 03/24/2013   AST 45* 03/24/2013   NA 141 03/24/2013   K 4.9 03/24/2013   CL 111 03/24/2013   CREATININE 1.7* 03/24/2013   BUN 28* 03/24/2013   CO2 19 03/24/2013   TSH 0.964 06/21/2007   INR 2.1 04/03/2013      Assessment &  Plan:

## 2013-04-10 NOTE — Patient Instructions (Signed)
Health Maintenance, Males A healthy lifestyle and preventative care can promote health and wellness.  Maintain regular health, dental, and eye exams.  Eat a healthy diet. Foods like vegetables, fruits, whole grains, low-fat dairy products, and lean protein foods contain the nutrients you need without too many calories. Decrease your intake of foods high in solid fats, added sugars, and salt. Get information about a proper diet from your caregiver, if necessary.  Regular physical exercise is one of the most important things you can do for your health. Most adults should get at least 150 minutes of moderate-intensity exercise (any activity that increases your heart rate and causes you to sweat) each week. In addition, most adults need muscle-strengthening exercises on 2 or more days a week.   Maintain a healthy weight. The body mass index (BMI) is a screening tool to identify possible weight problems. It provides an estimate of body fat based on height and weight. Your caregiver can help determine your BMI, and can help you achieve or maintain a healthy weight. For adults 20 years and older:  A BMI below 18.5 is considered underweight.  A BMI of 18.5 to 24.9 is normal.  A BMI of 25 to 29.9 is considered overweight.  A BMI of 30 and above is considered obese.  Maintain normal blood lipids and cholesterol by exercising and minimizing your intake of saturated fat. Eat a balanced diet with plenty of fruits and vegetables. Blood tests for lipids and cholesterol should begin at age 20 and be repeated every 5 years. If your lipid or cholesterol levels are high, you are over 50, or you are a high risk for heart disease, you may need your cholesterol levels checked more frequently.Ongoing high lipid and cholesterol levels should be treated with medicines, if diet and exercise are not effective.  If you smoke, find out from your caregiver how to quit. If you do not use tobacco, do not start.  If you  choose to drink alcohol, do not exceed 2 drinks per day. One drink is considered to be 12 ounces (355 mL) of beer, 5 ounces (148 mL) of wine, or 1.5 ounces (44 mL) of liquor.  Avoid use of street drugs. Do not share needles with anyone. Ask for help if you need support or instructions about stopping the use of drugs.  High blood pressure causes heart disease and increases the risk of stroke. Blood pressure should be checked at least every 1 to 2 years. Ongoing high blood pressure should be treated with medicines if weight loss and exercise are not effective.  If you are 45 to 31 years old, ask your caregiver if you should take aspirin to prevent heart disease.  Diabetes screening involves taking a blood sample to check your fasting blood sugar level. This should be done once every 3 years, after age 45, if you are within normal weight and without risk factors for diabetes. Testing should be considered at a younger age or be carried out more frequently if you are overweight and have at least 1 risk factor for diabetes.  Colorectal cancer can be detected and often prevented. Most routine colorectal cancer screening begins at the age of 50 and continues through age 75. However, your caregiver may recommend screening at an earlier age if you have risk factors for colon cancer. On a yearly basis, your caregiver may provide home test kits to check for hidden blood in the stool. Use of a small camera at the end of a tube,   to directly examine the colon (sigmoidoscopy or colonoscopy), can detect the earliest forms of colorectal cancer. Talk to your caregiver about this at age 50, when routine screening begins. Direct examination of the colon should be repeated every 5 to 10 years through age 75, unless early forms of pre-cancerous polyps or small growths are found.  Hepatitis C blood testing is recommended for all people born from 1945 through 1965 and any individual with known risks for hepatitis C.  Healthy  men should no longer receive prostate-specific antigen (PSA) blood tests as part of routine cancer screening. Consult with your caregiver about prostate cancer screening.  Testicular cancer screening is not recommended for adolescents or adult males who have no symptoms. Screening includes self-exam, caregiver exam, and other screening tests. Consult with your caregiver about any symptoms you have or any concerns you have about testicular cancer.  Practice safe sex. Use condoms and avoid high-risk sexual practices to reduce the spread of sexually transmitted infections (STIs).  Use sunscreen with a sun protection factor (SPF) of 30 or greater. Apply sunscreen liberally and repeatedly throughout the day. You should seek shade when your shadow is shorter than you. Protect yourself by wearing long sleeves, pants, a wide-brimmed hat, and sunglasses year round, whenever you are outdoors.  Notify your caregiver of new moles or changes in moles, especially if there is a change in shape or color. Also notify your caregiver if a mole is larger than the size of a pencil eraser.  A one-time screening for abdominal aortic aneurysm (AAA) and surgical repair of large AAAs by sound wave imaging (ultrasonography) is recommended for ages 65 to 75 years who are current or former smokers.  Stay current with your immunizations. Document Released: 11/25/2007 Document Revised: 08/21/2011 Document Reviewed: 10/24/2010 Exit[employer] Patient Information 2014 [employer].  

## 2013-04-14 ENCOUNTER — Telehealth: Payer: Self-pay | Admitting: Internal Medicine

## 2013-04-14 NOTE — Telephone Encounter (Signed)
I spoke with the pt and he states that he tried to get the tussionex refilled with the rx we sent him but the pharmacy is needing authorization to do early fill. I called pharmacy and advised ok to refill this rx early and that the pt has a post dated script to use on 04-24-13. Pharmacy advised this is all that was needed and they will fill the medication. Pt is aware. Carron Curie, CMA

## 2013-04-15 ENCOUNTER — Encounter (HOSPITAL_COMMUNITY)
Admission: RE | Admit: 2013-04-15 | Discharge: 2013-04-15 | Disposition: A | Discharge status: Home / Self Care | Payer: 59 | Source: Ambulatory Visit | Attending: Internal Medicine | Admitting: Internal Medicine

## 2013-04-15 DIAGNOSIS — Z5189 Encounter for other specified aftercare: Secondary | ICD-10-CM | POA: Insufficient documentation

## 2013-04-15 DIAGNOSIS — Z79899 Other long term (current) drug therapy: Secondary | ICD-10-CM | POA: Insufficient documentation

## 2013-04-15 DIAGNOSIS — Z86718 Personal history of other venous thrombosis and embolism: Secondary | ICD-10-CM | POA: Insufficient documentation

## 2013-04-15 DIAGNOSIS — J45909 Unspecified asthma, uncomplicated: Secondary | ICD-10-CM | POA: Insufficient documentation

## 2013-04-16 ENCOUNTER — Telehealth: Payer: Self-pay | Admitting: *Deleted

## 2013-04-16 LAB — QUANTIFERON TB GOLD ASSAY (BLOOD)
Interferon Gamma Release Assay: NEGATIVE
Mitogen value: >10 IU/mL
Quantiferon Nil Value: 0.03 IU/mL
TB Ag value: 0.04 IU/mL

## 2013-04-16 NOTE — Telephone Encounter (Signed)
Ok with me 

## 2013-04-16 NOTE — Telephone Encounter (Signed)
Pt called requesting re-certification with [facility] for INR checks.  Please advise

## 2013-04-16 NOTE — Telephone Encounter (Signed)
Spoke with [name] at [facility], advised of MDs message.  However she states INR checks is not a skillable need and they are not able to service pt for that need.  Spoke with pt advised of Regina's message.

## 2013-04-17 ENCOUNTER — Encounter: Payer: Self-pay | Admitting: Internal Medicine

## 2013-04-17 ENCOUNTER — Encounter (HOSPITAL_COMMUNITY)
Admission: RE | Admit: 2013-04-17 | Discharge: 2013-04-17 | Disposition: A | Discharge status: Home / Self Care | Payer: 59 | Source: Ambulatory Visit | Attending: Internal Medicine | Admitting: Internal Medicine

## 2013-04-17 ENCOUNTER — Ambulatory Visit (INDEPENDENT_AMBULATORY_CARE_PROVIDER_SITE_OTHER): Payer: 59 | Admitting: Internal Medicine

## 2013-04-17 ENCOUNTER — Other Ambulatory Visit (INDEPENDENT_AMBULATORY_CARE_PROVIDER_SITE_OTHER): Payer: 59

## 2013-04-17 VITALS — BP 122/88 | HR 80 | Temp 97.8°F | Resp 20 | Ht 73.0 in | Wt 224.0 lb

## 2013-04-17 DIAGNOSIS — R7309 Other abnormal glucose: Secondary | ICD-10-CM

## 2013-04-17 DIAGNOSIS — Z7901 Long term (current) use of anticoagulants: Secondary | ICD-10-CM

## 2013-04-17 DIAGNOSIS — I2699 Other pulmonary embolism without acute cor pulmonale: Secondary | ICD-10-CM

## 2013-04-17 DIAGNOSIS — N189 Chronic kidney disease, unspecified: Secondary | ICD-10-CM

## 2013-04-17 DIAGNOSIS — D649 Anemia, unspecified: Secondary | ICD-10-CM

## 2013-04-17 LAB — BASIC METABOLIC PANEL
BUN: 26 mg/dL — ABNORMAL HIGH (ref 6–23)
CO2: 20 mEq/L (ref 19–32)
Chloride: 110 mEq/L (ref 96–112)
Creatinine, Ser: 2.3 mg/dL — ABNORMAL HIGH (ref 0.4–1.5)

## 2013-04-17 LAB — CBC WITH DIFFERENTIAL/PLATELET
Basophils Relative: 0.3 % (ref 0.0–3.0)
Eosinophils Absolute: 0.1 10*3/uL (ref 0.0–0.7)
Hemoglobin: 11.5 g/dL — ABNORMAL LOW (ref 13.0–17.0)
Lymphocytes Relative: 34.6 % (ref 12.0–46.0)
MCHC: 33.6 g/dL (ref 30.0–36.0)
MCV: 81 fl (ref 78.0–100.0)
Neutro Abs: 2.8 10*3/uL (ref 1.4–7.7)
RBC: 4.21 Mil/uL — ABNORMAL LOW (ref 4.22–5.81)

## 2013-04-17 LAB — HEMOGLOBIN A1C: Hgb A1c MFr Bld: 5.7 % (ref 4.6–6.5)

## 2013-04-17 LAB — PROTIME-INR: Prothrombin Time: 26.8 s — ABNORMAL HIGH (ref 10.2–12.4)

## 2013-04-17 NOTE — Assessment & Plan Note (Signed)
His renal function has declined some as he appears to be very slightly dehydrated

## 2013-04-17 NOTE — Progress Notes (Signed)
Pre visit review using our clinic review tool, if applicable. No additional management support is needed unless otherwise documented below in the visit note. 

## 2013-04-17 NOTE — Progress Notes (Signed)
  Subjective:    Patient ID: [name], male    DOB: 05-07-1981, 32 y.o.   MRN: [id number]  HPI  Her returns for f/up and complains of bruising over his extremities and easy bleeding when he scratches himself. He needs to get an INR done.   Review of Systems  Constitutional: Positive for fatigue. Negative for fever, chills, diaphoresis, activity change, appetite change and unexpected weight change.  HENT: Negative.   Eyes: Negative.   Respiratory: Negative.  Negative for cough, chest tightness, shortness of breath, wheezing and stridor.   Cardiovascular: Negative.  Negative for chest pain, palpitations and leg swelling.  Gastrointestinal: Negative.  Negative for nausea, vomiting, abdominal pain, diarrhea, constipation and blood in stool.  Endocrine: Negative.  Negative for polydipsia, polyphagia and polyuria.  Genitourinary: Negative.   Musculoskeletal: Negative.   Skin: Negative.  Negative for color change, pallor, rash and wound.  Allergic/Immunologic: Negative.   Neurological: Negative.  Negative for dizziness, tremors, weakness, light-headedness, numbness and headaches.  Hematological: Negative for adenopathy. Bruises/bleeds easily.  Psychiatric/Behavioral: Negative.        Objective:   Physical Exam  Vitals reviewed. Constitutional: He is oriented to person, place, and time. He appears well-developed and well-nourished. No distress.  HENT:  Head: Normocephalic and atraumatic.  Mouth/Throat: Oropharynx is clear and moist. No oropharyngeal exudate.  Eyes: Conjunctivae are normal. Right eye exhibits no discharge. Left eye exhibits no discharge. No scleral icterus.  Neck: Normal range of motion. Neck supple. No JVD present. No tracheal deviation present. No thyromegaly present.  Cardiovascular: Normal rate, regular rhythm, normal heart sounds and intact distal pulses.  Exam reveals no gallop and no friction rub.   No murmur heard. Pulmonary/Chest: Effort normal and  breath sounds normal. No stridor. No respiratory distress. He has no wheezes. He has no rales. He exhibits no tenderness.  Abdominal: Soft. Bowel sounds are normal. He exhibits no distension and no mass. There is no tenderness. There is no rebound and no guarding.  Musculoskeletal: Normal range of motion. He exhibits no edema and no tenderness.  Lymphadenopathy:    He has no cervical adenopathy.  Neurological: He is oriented to person, place, and time.  Skin: Skin is warm, dry and intact. Bruising and ecchymosis noted. No abrasion, no burn, no laceration, no lesion, no petechiae and no rash noted. He is not diaphoretic. No erythema. No pallor.     He has scattered ecchymoses over his extremities ad torso  Psychiatric: He has a normal mood and affect. His behavior is normal. Judgment and thought content normal.    Lab Results  Component Value Date   WBC 4.9 03/24/2013   HGB 12.3* 03/24/2013   HCT 37.5* 03/24/2013   PLT 253.0 03/24/2013   GLUCOSE 146* 03/24/2013   CHOL 150 04/10/2013   TRIG 108.0 04/10/2013   HDL 47.60 04/10/2013   LDLCALC 81 04/10/2013   ALT 46 03/24/2013   AST 45* 03/24/2013   NA 141 03/24/2013   K 4.9 03/24/2013   CL 111 03/24/2013   CREATININE 1.7* 03/24/2013   BUN 28* 03/24/2013   CO2 19 03/24/2013   TSH 0.964 06/21/2007   INR 1.8* 04/10/2013        Assessment & Plan:

## 2013-04-17 NOTE — Patient Instructions (Signed)

## 2013-04-17 NOTE — Assessment & Plan Note (Signed)
slightly worse today consistent with the ecchymoses

## 2013-04-17 NOTE — Assessment & Plan Note (Signed)
His INR is in the appropriate range, his plt count is normal I think the ecchymoses are related to incidental trauma He will d/w his hematology at [facility] the option of switching to xarelto 15 mg per day

## 2013-04-17 NOTE — Assessment & Plan Note (Signed)
I will check his A1C to see if he has developed DM2 

## 2013-04-22 ENCOUNTER — Encounter (HOSPITAL_COMMUNITY): Payer: 59

## 2013-04-22 ENCOUNTER — Telehealth: Payer: Self-pay | Admitting: Internal Medicine

## 2013-04-22 DIAGNOSIS — J849 Interstitial pulmonary disease, unspecified: Secondary | ICD-10-CM

## 2013-04-22 NOTE — Telephone Encounter (Signed)
lmomtcb x1 for melissa 

## 2013-04-22 NOTE — Telephone Encounter (Signed)
Spoke with Melissa with [facility].  Reports pt will be traveling to a hospital out of state on Nov 29- Dec 5.  Pt is requesting travel o2 for this trip.  Per Efraim Kaufmann, they have an order for o2 for 2 lpm at rest and 15 lpm with exertion.  However, Melissa reports pt states he only uses 2 lpm at rest and 6 lpm pulsed with exertion.  Per Melissa, pt states the 15 lpm is for when he is riding a bike, which he will not be doing while on this trip.  Melissa states they can provide pt with a simply go portable system that will supply pt with 2 lpm o2 at rest and the 6 lpm pulsed with exertion, but will need an ok for this given their orders are different.  MR, pls advise if this order is ok.  Thank you.

## 2013-04-23 NOTE — Telephone Encounter (Signed)
Ok to change the order as requested   Dr. Kalman Shan, M.D., [facility].C.P Pulmonary and Critical Care Medicine Staff Physician [facility] Pulmonary and Critical Care Pager: [phone], If no answer or between  15:00h - 7:00h: call [phone]  04/23/2013 1:42 PM

## 2013-04-23 NOTE — Telephone Encounter (Signed)
Order placed to [facility] for [facility].

## 2013-04-24 ENCOUNTER — Encounter (HOSPITAL_COMMUNITY): Payer: 59

## 2013-04-29 ENCOUNTER — Encounter (HOSPITAL_COMMUNITY)
Admission: RE | Admit: 2013-04-29 | Discharge: 2013-04-29 | Disposition: A | Discharge status: Home / Self Care | Payer: 59 | Source: Ambulatory Visit | Attending: Internal Medicine | Admitting: Internal Medicine

## 2013-04-30 ENCOUNTER — Telehealth: Payer: Self-pay | Admitting: General Practice

## 2013-04-30 NOTE — Telephone Encounter (Signed)
LMOM for patient to call to set up appointment in coumadin clinic.

## 2013-05-01 ENCOUNTER — Encounter (HOSPITAL_COMMUNITY)
Admission: RE | Admit: 2013-05-01 | Discharge: 2013-05-01 | Disposition: A | Discharge status: Home / Self Care | Payer: 59 | Source: Ambulatory Visit | Attending: Internal Medicine | Admitting: Internal Medicine

## 2013-05-02 ENCOUNTER — Encounter: Payer: Self-pay | Admitting: *Deleted

## 2013-05-02 ENCOUNTER — Encounter: Payer: Self-pay | Admitting: Internal Medicine

## 2013-05-02 ENCOUNTER — Ambulatory Visit (INDEPENDENT_AMBULATORY_CARE_PROVIDER_SITE_OTHER): Payer: 59 | Admitting: Internal Medicine

## 2013-05-02 ENCOUNTER — Other Ambulatory Visit (INDEPENDENT_AMBULATORY_CARE_PROVIDER_SITE_OTHER): Payer: 59

## 2013-05-02 VITALS — BP 140/92 | HR 113 | Ht 73.0 in | Wt 225.8 lb

## 2013-05-02 DIAGNOSIS — J8410 Pulmonary fibrosis, unspecified: Secondary | ICD-10-CM

## 2013-05-02 DIAGNOSIS — J849 Interstitial pulmonary disease, unspecified: Secondary | ICD-10-CM

## 2013-05-02 LAB — HEPATIC FUNCTION PANEL
Bilirubin, Direct: 0.1 mg/dL (ref 0.0–0.3)
Total Bilirubin: 0.4 mg/dL (ref 0.3–1.2)

## 2013-05-02 LAB — BASIC METABOLIC PANEL
Calcium: 9.1 mg/dL (ref 8.4–10.5)
Creatinine, Ser: 2.3 mg/dL — ABNORMAL HIGH (ref 0.4–1.5)
GFR: 34.91 mL/min — ABNORMAL LOW (ref >60.00)

## 2013-05-02 LAB — CBC
HCT: 36.3 % — ABNORMAL LOW (ref 39.0–52.0)
Hemoglobin: 11.9 g/dL — ABNORMAL LOW (ref 13.0–17.0)
MCHC: 32.9 g/dL (ref 30.0–36.0)
MCV: 84.2 fl (ref 78.0–100.0)
RDW: 19.5 % — ABNORMAL HIGH (ref 11.5–14.6)

## 2013-05-02 MED ORDER — HYDROCOD POLST-CHLORPHEN POLST 10-8 MG/5ML PO LQCR: 5.0000 mL | Freq: Two times a day (BID) | ORAL | Status: DC | PRN

## 2013-05-02 NOTE — Patient Instructions (Addendum)
#  Pulmonary Fibrosis - glad you have lost weight - glad rehab working well for you but I understand the need for you to work out more  - I will talk to pulmonary rehab to see if they can increase your exercise time; doubt it  - MEanwhile, take letter from [place] that you can work out in a gym at your own risk - continue pirfenidone  -  Take script for Pirfenidone (Pirfenex) 200mg  table x 4 tabs, three times a day which at current renal function does not need dose adjustment  - check blood work today for CBC, BMET, LFT  - continue oxygen - continue tussionex for cough; take 1 current + 1 post dated prescriptions - given yoru weight loss; we will reinitiate [facility] referral - full PFT at followup  #Pulmonary EMbolism - no need for aspirin  - continue Eliquis per hematologist; current renal function does not require dose adjustment   #Followup -  8 weeks with full PFT at followup

## 2013-05-02 NOTE — Progress Notes (Signed)
Subjective:    Patient ID: [name], male    DOB: [birth year], 32 y.o.   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   Here with friend [name] Last here August 09, 2010 Currently says breathing is pretty good. Had temp 100 degrees x 1 week, sore throat, tender left cervical node. Had dieted and lost 40 lbs since off prednisone x 6 months. Now on 6-mercaptopurine (which drops WBC to 3000) and Remicade. These drugs are managed by Dr Sherrill/ Heme Onc and Dr Schooler/ GI. Denies wheeze, phlegm. Took oxycodone this AM for pains. For the past year has had some dry cough. Concerned about this as an early fibrosis marker. Wants PFT every 6 months. He plans to go( unscheduled so far) for CXR and BAL at [facility] so he asks not to do a cxr here so as to minimize radiation exposure.     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   Since last here he had a total colectomy in 2012. Had ER visit at [facility] 03/24/2011 and CT scan suggested "something in lung" which he wanted to review. Treated with Levaquin. Has also had ER visit for kidney stone. Has stage III renal disease with 20% function-not known why. CXR 11/26/11- he reports "much clearer". Images were available on discs he brought from [facility]. We reviewed the images together. It looks as if he was dealing with a pneumonia in October with some residual density-pneumonia versus atelectasis-on the recent chest x-ray. He has minimal cough and no acute chest symptoms. Some of his interstitial markings may have been a little prominent but there was no dramatic interstitial fibrosis.  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 ACUTE VISIT: about 1 week or so ago started having cough, congestion, and had stomach flu over the weekend; noticed increase in mucus-started abx yesterday and has reduced the mucus amount. Major concern was breathing issues-felt unable to get breath and go any further. Noiced wheezing as well.We had called in augmentin yesterday to start because of his concern he was getting pneumonia. Describes 2 weeks of increased chest congestion and chest tightness but very productive cough. GI viral gastroenteritis pattern over this weekend seems to be a different problem. We had called in Augmentin which further aggravated loose stools through his colostomy. He is not on a routine probiotic. Today feeling better. Denies fever or sore throat.  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 ACUTE VISIT: SOB increased and yellow productive cough, labored breathing when talking,etc. Feelings of drowning and passing out; was given Doxycycline and Cipro We had stopped Augmentin at last visit because of watery diarrhea attributed to the antibiotic, although it may have been a separate viral gastroenteritis. He now says he chose to continue the Augmentin. We were closed first no last week he called the on-call doctor to get prescriptions for doxycycline and Cipro. Prednisone already being taken for his colitis was increased to 40 mg daily for wheezing. Cough was very productive. Today he asks he worked in, reporting more shortness of breath in the last few days "scary". Using rescue inhaler and  resumed Symbicort for modest benefit. He notices some wheezing lying down but not when he is upright. Arthralgias in   knees, no rash, fever or purulent sputum. He had past history of DVT complicating his colectomy by report. CXR 07/19/12- IMPRESSION:  Central peribronchial thickening, probably indicative of  bronchitis. No frank edema or consolidation.  Original Report Authenticated By: Bretta Bang, M.D.  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 FOLLOWS FOR: was told had blood clot and currently on coumadin now; would like to discuss coming off of it if possible.  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 Lovenox/warfarin lead to rectal bleeding-now on warfarin alone. Followed by hematologist at [facility]. Question this is an old clot -he did have DVT after previous surgery- but probably new/recurrent. No hemoptysis. He feels pneumonia resolved. Chest feels well. Home health nurse following INR. CXR 08/14/12 IMPRESSION:  Persistent patchy opacities in the left upper lobe and right base  dating back to 07/16/2012. Findings likely reflect residual changes  of pneumonia or potentially chronic underlying scarring/fibrosis.  Recommend additional repeat chest x-ray in 2 - 4 weeks. If there  is still no interval change, further evaluation with CT scan of the  chest would likely be warranted to further evaluate.  Original Report Authenticated By: Malachy Moan, M.D.  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 ACUTE VISIT: having trouble breathing x 2 weeks(happens with minor exertion) denies any wheezing, cough, or congestion. He started Zyrtec 2 weeks ago. Not using Symbicort "just too many meds" and not wheezing. He notes easy dyspnea on exertion  across a room or a telephone. Denies phlegm or cough, fever, sweats or chills. Feet are not swelling. We discussed anemia given his history of GI bleed. CXR 09/04/12 IMPRESSION:  Persistent streaky airspace/infiltrates bilateral upper and lower  lobe with central distribution. The findings probable due to  persistent pneumonia superimposed on chronic interstitial lung  disease. Slight worsening in aeration right upper lobe. Follow-up  to resolution is recommended. Further evaluation with CT of the  chest could be performed as clinically warranted.  Original Report Authenticated By: Natasha Mead, M.D.  10/08/12- 61 yoM never smoker followed for asthma, allergic rhinitis, recurrent respiratory infections, Inflammatory bowel disease/ colostomy (Hermansky-Pudlak Syndrome- albinism, colitis, pulmonary fibrosis).Recurrent DVT. Father and wife are here. Follow up to discuss test results.  States o2 sats are staying 88-90% RA during the day. He had an apparent pneumonia in February. Shortness of breath improved with antibiotics been diagnosed with recurrent DVT/PE which is followed by hematology at [facility] with plan to treat with anticoagulation for 3 months. He had a GI bleed. INR has been labile. Now on iron for anemia. In the last few weeks he has felt more short of breath especially with exertion, breathless with speech on room air. Not much effect from his inhalers. He gave disc of his CT scan to pulmonary at [facility]- they have expert on his syndrome. Using the elliptical trainer for exercise, 30 minutes is getting too hard and he has reduced to 10 minute sessions. ONOX- results pending PFT-10/04/2012-mild obstructive airways disease with response to bronchodilator, borderline mild air trapping with increased RV, diffusion severely reduced. FVC 4.30/74%, FEV1 3.45/77%, FEV1/FVC 0.80. TLC 92%, RV 121%, DLCO 40%. CT 4/24 /14  IMPRESSION:  Slightly  upper lobe predominant peribronchovascular fibrosis, as   detailed above. This is new since 07/28/2008.  No evidence of acute superimposed process.  Mild thoracic adenopathy, favored to be reactive.  Original Report Authenticated By: Jeronimo Greaves, M.D.  10/18/12- 11 yoM never smoker followed for asthma, allergic rhinitis, recurrent respiratory infections, Inflammatory bowel disease/ colostomy (Hermansky-Pudlak Syndrome- albinism, colitis, pulmonary fibrosis).Recurrent DVT. Father and wife are here. FOLLOWS FOR: Feels that his breathing has gotten worse. He was exposed to someone who later developed pneumonia P. Reports DOE while on 3L and minor congestion. Denies chest pain or wheezing. Acute Visit, mostly wanting consultation. Clark is noticing a decline in oxygen saturation with easier dyspnea on exertion. Sometimes saturation dips to 88% on 3 L and his heartbeat seems rapid. He has been on clobetasol for active Crohn's disease. Currently off Dapsone and 6 MCP. He remains on warfarin after recurrent DVT.  11/11/12-    Father here FOLLOWS FOR: pt states chest congestion has not improved in last 1 week-- would like to discuss perfinidone options-- would also like to discuss lung tx options--should pt stay away from daughter who will get vaccinations tomorrow On Zithromax. Feeling better. Mucinex and water helps clear mucus. Family is passing around cold. He remains on and clobetasol, but has finished prednisone taper. Wife has had strep throat. CXR 10/18/12-  IMPRESSION:  Stable chronic interstitial lung disease/fibrosis.  Original Report Authenticated By: Judie Petit. Shick, M.D.  01/10/13- 53 yoM never smoker followed for asthma, allergic rhinitis, recurrent respiratory infections, Inflammatory bowel disease/ colostomy (Hermansky-Pudlak Syndrome- albinism, colitis, pulmonary fibrosis).Recurrent DVT. FOLLOWS FOR: ILD. Pt states when he tries to take a deep breathe he has chest pains. This has been going on x 1 week. Also reports increase SOB w/ exertion. He is  using 6 liters O2 w/ exertion and 2-3 liters at rest B. [facility] transplant program has told him he must lose 63 pounds, so we can re- refer him at 190 pounds. They also want him off of fentanyl and ativan. He is now working hard at these. He would apparently need a heart and kidney transplant. If he forces deep breaths during exercise he gets diffuse anterior chest pain that seems more related to the deep breath than to the exertion. He has gotten generic Perfenidone/ Perfenex from [place] and is taking 200 mg x 4, TID.    OV 02/19/2013  - switching from Dr Maple Hudson to Dr Marchelle Gearing 86 yoM never smoker followed for asthma, allergic rhinitis, recurrent respiratory infections, Inflammatory bowel disease/ colostomy (Hermansky-Pudlak Syndrome- albinism, colitis, pulmonary fibrosis).Recurrent DVT  Several issues. Overall stable. Now on Pirfenidone from [place].   #Pulmonary Fibrosis - Transplant: - been advised by [facility] that he is overweight and also he needs multi-organ transplant so they rejected his application. He is considering looking at [employer] or [employer]    - Weight loss: he is on a supervised weight hypocaloric weight loss regiment through a ? Surgeon at [employer] through a program called [employer] but he wants a longer term solution. He is afraid to go on green vegetable diet because it will raise his INR  - Treatment:  On pirfenidone. Tolerating well. Wants  cbc, bmet, lft , EKG check today; Qtc 385sec  - Oyxgen need: he wants mask oxygen esp at rehab and at night. Wants special mask and wants [place] do script ft  -Rehab: he wants thresholds liberalized for pulse ox and tachycardia   #Pulmonary EMbolism  - on coumadin. Not eligble for xarelto  due to renal insuff. Unable to go on weight loss with green vegetables due to coumadin intake. Currently feels that weight loss higher priority than clot recurrence due to tranwsplant eligibility. Wants to consider aspirin and lowered  INR goal   OV  05/02/2013 32 yoM never smoker followed for asthma, allergic rhinitis, recurrent respiratory infections, Inflammatory bowel disease/ colostomy (Hermansky-Pudlak Syndrome- albinism, colitis, pulmonary fibrosis).Recurrent DVT   Overall he is doing well. She has lost weight. He says that when he is naked he is 214 pounds. The goal is to get into 200 pounds second eligible for transplant and therefore he wants his transplant referral reinitiated because he is so close. He continues [place] pirfenidone 200mg  x 4 tabs, tid but apparently the age in [place] now wants a prescription from me as a [place] physician [I am also licensed physician in [place]]. He is enjoying rehabilitation but wants to exercise more. He says that time there is insufficient. I've agreed to talk to the rehabilitation people but have said that the significant constraints and he might have to go to a gym. Therefore we will do a letter that he will work out in the gym at his own risk. In terms of pulmonary embolism he is on Apixaban his hematologist and this is working out well for him.  No other new issues. He needs lab work because of his medications  LAbs  Recent Labs Lab 05/02/13 1504  HGB 11.9*  HCT 36.3*  WBC 4.4*  PLT 207.0    Recent Labs Lab 05/02/13 1504  NA 138  K 3.6  CL 111  CO2 19  GLUCOSE 99  BUN 31*  CREATININE 2.3*  CALCIUM 9.1   Estimated Creatinine Clearance: 58.5 ml/min (by C-G formula based on Cr of 2.3).   Recent Labs Lab 05/02/13 1504  AST 29  ALT 30  ALKPHOS 75  BILITOT 0.4  PROT 7.6  ALBUMIN 3.9     Review of Systems  Constitutional: Negative for fever and unexpected weight change.  HENT: Negative for congestion, dental problem, ear pain, nosebleeds, postnasal drip, rhinorrhea, sinus pressure, sneezing, sore throat and trouble swallowing.   Eyes: Negative for redness and itching.  Respiratory: Negative for cough, chest tightness, shortness of breath and wheezing.   Cardiovascular:  Negative for palpitations and leg swelling.  Gastrointestinal: Negative for nausea and vomiting.  Genitourinary: Negative for dysuria.  Musculoskeletal: Negative for joint swelling.  Skin: Negative for rash.  Neurological: Negative for headaches.  Hematological: Does not bruise/bleed easily.  Psychiatric/Behavioral: Negative for dysphoric mood. The patient is not nervous/anxious.        Objective:   Physical Exam   General- Alert, Oriented, Affect-calm today, Distress- none acute, albino, O2 4L Skin- +Albino.  Lymphadenopathy- none Head- atraumatic            Eyes-  PERRLA, conjunctivae and secretions clear. +Strabismus, +nystagmus            Ears- Hearing, canals-normal            Nose- Clear, no-Septal dev, mucus, polyps, erosion, perforation             Throat- Mallampati II , mucosa clear , drainage- none, tonsils- atrophic Neck- flexible , trachea midline, no stridor , thyroid nl, carotid no bruit Chest - symmetrical excursion , unlabored           Heart/CV- RRR , no murmur , no gallop  , no rub, nl s1 s2                           -  JVD- none , edema-none, stasis changes- none, varices- none           Lung- + trace crackles heard, wheeze- none, cough-none , dullness-none, rub- none.            Chest wall- ? Tender to pressure sternum Abd- +colostomy Gen/ Rectal- Not done, not indicated Extrem- cyanosis- none, clubbing, none, atrophy- none, strength- nl.  Neuro- grossly intact to observation except nystagmus    Filed Vitals:   05/02/13 1402  Height: 6\' 1"  (1.854 m)  Weight: 225 lb 12.8 oz (102.422 kg)         Assessment & Plan:

## 2013-05-03 NOTE — Assessment & Plan Note (Addendum)
#  Pulmonary Fibrosis - glad you have lost weight - glad rehab working well for you but I understand the need for you to work out more  - I will talk to pulmonary rehab to see if they can increase your exercise time; doubt it  - MEanwhile, take letter from [place] that you can work out in a gym at your own risk - continue pirfenidone  -  Take script for Pirfenidone (Pirfenex) 200mg  table x 4 tabs, three times a day which at current renal function does not need dose adjustment  - check blood work today for CBC, BMET, LFT  - continue oxygen - continue tussionex for cough; take 1 current + 1 post dated prescriptions - given yoru weight loss; we will reinitiate [facility] tras[facility]plant referral - full PFT at followup  #Pulmonary EMbolism - no need for aspirin  - continue Eliquis per hematologist; current renal function does not require dose adjustment   #Followup -  8 weeks with full PFT at followup   > 50% of this > 40 min visit spent in face to face counseling (30 min visit converted to 45 min)

## 2013-05-06 ENCOUNTER — Encounter (HOSPITAL_COMMUNITY): Payer: 59

## 2013-05-08 ENCOUNTER — Encounter (HOSPITAL_COMMUNITY): Payer: 59

## 2013-05-09 ENCOUNTER — Other Ambulatory Visit: Payer: Self-pay | Admitting: *Deleted

## 2013-05-09 DIAGNOSIS — J849 Interstitial pulmonary disease, unspecified: Secondary | ICD-10-CM

## 2013-05-13 ENCOUNTER — Encounter (HOSPITAL_COMMUNITY)
Admission: RE | Admit: 2013-05-13 | Discharge: 2013-05-13 | Disposition: A | Discharge status: Home / Self Care | Payer: 59 | Source: Ambulatory Visit | Attending: Internal Medicine | Admitting: Internal Medicine

## 2013-05-13 DIAGNOSIS — Z79899 Other long term (current) drug therapy: Secondary | ICD-10-CM | POA: Insufficient documentation

## 2013-05-13 DIAGNOSIS — J45909 Unspecified asthma, uncomplicated: Secondary | ICD-10-CM | POA: Insufficient documentation

## 2013-05-13 DIAGNOSIS — Z86718 Personal history of other venous thrombosis and embolism: Secondary | ICD-10-CM | POA: Insufficient documentation

## 2013-05-13 DIAGNOSIS — Z5189 Encounter for other specified aftercare: Secondary | ICD-10-CM | POA: Insufficient documentation

## 2013-05-14 ENCOUNTER — Ambulatory Visit: Payer: 59 | Admitting: General Practice

## 2013-05-15 ENCOUNTER — Encounter (HOSPITAL_COMMUNITY): Payer: 59

## 2013-05-17 ENCOUNTER — Telehealth: Payer: Self-pay | Admitting: Pulmonary Disease

## 2013-05-17 ENCOUNTER — Telehealth: Payer: Self-pay | Admitting: Internal Medicine

## 2013-05-17 NOTE — Telephone Encounter (Signed)
Pt notes that his heart rate is 46, and he is usually a little tachy at times.  Has 2 pulse oximeters, both read the same.  If he gets up and moves to a different room, goes up to 80's.  Denies associated symptoms.  He has been more sleepy lately with inactivity.  No palpitations or chest discomfort. The pt is on pirfenidone, but EKG's in computer do not show prolonged QTc. Because he is on pirfenidone, and this is something that has never happened to him,  I have asked him to go to ER for evaluation and EKG.

## 2013-05-17 NOTE — Telephone Encounter (Signed)
[name]  I think DR clance took a call about bradycardia and he was asked to go to ER but I do not see evidence of being in ER. Please if needed put him on list to see me the week of 05/19/13  Dr. Kalman Shan, M.D., [facility].C.P Pulmonary and Critical Care Medicine Staff Physician [facility] [place] [facility]monary and Critical Care Pager: [phone], If no answer or between  15:00h - 7:00h: call [phone]  05/17/2013 11:39 PM

## 2013-05-19 ENCOUNTER — Telehealth: Payer: Self-pay | Admitting: Internal Medicine

## 2013-05-19 NOTE — Telephone Encounter (Signed)
I called and spoke with pt. He wants to see MR for a visit this week. He reports he still has some critical questions to as MR. Per Britt Boozer okay to use 12 pm on Friday.  I called pt back and appt scheduled. Nothing further needed

## 2013-05-19 NOTE — Telephone Encounter (Signed)
See 12.8.14 phone note.

## 2013-05-19 NOTE — Telephone Encounter (Signed)
HE can see TP and do an EKG during that visit with her. HE is a 30 min slot and if I do not hve should see TP in a 15 min slot  Dr. Kalman Shan, M.D., [facility].C.P Pulmonary and Critical Care Medicine Staff Physician [place] [place] Pulmonary and Critical Care Pager: [phone], If no answer or between  15:00h - 7:00h: call [phone]  05/19/2013 10:58 AM

## 2013-05-19 NOTE — Telephone Encounter (Signed)
Called spoke with patient, advised that MR would like for him to come in sometime this week for eval per the 12.6.14 phone by MR.  MR with no openings this week -- pt actually stated that he is at [facility] right now and would like to come in this afternoon when he is back in [place] for an appt for his wife at 3pm today.  MR please advise if pt may be worked in this afternoon as requested.  Thank you. **message from 12.6.14 phone note copied into this one and that note closed.  Kalman Shan, MD at 05/17/2013 11:29 PM     [name]  I think DR clance took a call about bradycardia and he was asked to go to ER but I do not see evidence of being in ER. Please if needed put him on list to see me the week of 05/19/13  Dr. Kalman Shan, M.D., [facility].C.P  Pulmonary and Critical Care Medicine  Staff Physician  [facility]  [place] Pulmonary and Critical Care  Pager: [phone], If no answer or between 15:00h - 7:00h: call [phone]  05/17/2013  11:39 PM

## 2013-05-20 ENCOUNTER — Encounter (HOSPITAL_COMMUNITY)
Admission: RE | Admit: 2013-05-20 | Discharge: 2013-05-20 | Disposition: A | Discharge status: Home / Self Care | Payer: 59 | Source: Ambulatory Visit | Attending: Internal Medicine | Admitting: Internal Medicine

## 2013-05-22 ENCOUNTER — Encounter (HOSPITAL_COMMUNITY)
Admission: RE | Admit: 2013-05-22 | Discharge: 2013-05-22 | Disposition: A | Discharge status: Home / Self Care | Payer: 59 | Source: Ambulatory Visit | Attending: Internal Medicine | Admitting: Internal Medicine

## 2013-05-23 ENCOUNTER — Encounter: Payer: Self-pay | Admitting: Internal Medicine

## 2013-05-23 ENCOUNTER — Ambulatory Visit (INDEPENDENT_AMBULATORY_CARE_PROVIDER_SITE_OTHER): Payer: 59 | Admitting: Internal Medicine

## 2013-05-23 VITALS — BP 130/82 | HR 90 | Ht 73.0 in | Wt 219.0 lb

## 2013-05-23 DIAGNOSIS — J849 Interstitial pulmonary disease, unspecified: Secondary | ICD-10-CM

## 2013-05-23 DIAGNOSIS — J8410 Pulmonary fibrosis, unspecified: Secondary | ICD-10-CM

## 2013-05-23 DIAGNOSIS — I498 Other specified cardiac arrhythmias: Secondary | ICD-10-CM

## 2013-05-23 DIAGNOSIS — R001 Bradycardia, unspecified: Secondary | ICD-10-CM

## 2013-05-23 MED ORDER — BENZONATATE 200 MG PO CAPS: 200.0000 mg | ORAL_CAPSULE | Freq: Three times a day (TID) | ORAL | Status: DC

## 2013-05-23 NOTE — Patient Instructions (Addendum)
Low hear rate:  -  EKG is at baseline now low heart rate related clonikdine likely  Cough:   - Take tessalon 200mg  three times a day in addition to tussionex 5ml twice daily  ILD:   - [facility] up with [facility]  - I will write to my contacts in [place] about pirfenidone export to [place]  - continue [place] pirfenidone (pirfenex); always apply sun screen  Followup  - as before

## 2013-05-23 NOTE — Progress Notes (Signed)
Subjective:    Patient ID: [name], male    DOB: 1980-12-19, 32 y.o.   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   Here with friend [name] Last here August 09, 2010 Currently says breathing is pretty good. Had temp 100 degrees x 1 week, sore throat, tender left cervical node. Had dieted and lost 40 lbs since off prednisone x 6 months. Now on 6-mercaptopurine (which drops WBC to 3000) and Remicade. These drugs are managed by Dr Sherrill/ Heme Onc and Dr Schooler/ GI. Denies wheeze, phlegm. Took oxycodone this AM for pains. For the past year has had some dry cough. Concerned about this as an early fibrosis marker. Wants PFT every 6 months. He plans to go( unscheduled so far) for CXR and BAL at the [facility] so he asks not to do a cxr here so as to minimize radiation exposure.     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   Since last here he had a total colectomy in 2012. Had ER visit at [facility] 03/24/2011 and CT scan suggested "something in lung" which he wanted to review. Treated with Levaquin. Has also had ER visit for kidney stone. Has stage III renal disease with 20% function-not known why. CXR 11/26/11- he reports "much clearer". Images were available on discs he brought from [facility]. We reviewed the images together. It looks as if he was dealing with a pneumonia in October with some residual density-pneumonia versus atelectasis-on the recent chest x-ray. He has minimal cough and no acute chest symptoms. Some of his interstitial markings may have been a little prominent but there was no dramatic interstitial fibrosis.  07/16/12- 31 yoM never smoker followed for  asthma, allergic rhinitis, recurrent respiratory infections, Inflammatory bowel disease/ colostomy (Hermansky-Pudlak Syndrome- albinism, colitis, pulmonary fibrosis). ACUTE VISIT: about 1 week or so ago started having cough, congestion, and had stomach flu over the weekend; noticed increase in mucus-started abx yesterday and has reduced the mucus amount. Major concern was breathing issues-felt unable to get breath and go any further. Noiced wheezing as well.We had called in augmentin yesterday to start because of his concern he was getting pneumonia. Describes 2 weeks of increased chest congestion and chest tightness but very productive cough. GI viral gastroenteritis pattern over this weekend seems to be a different problem. We had called in Augmentin which further aggravated loose stools through his colostomy. He is not on a routine probiotic. Today feeling better. Denies fever or sore throat.  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 ACUTE VISIT: SOB increased and yellow productive cough, labored breathing when talking,etc. Feelings of drowning and passing out; was given Doxycycline and Cipro We had stopped Augmentin at last visit because of watery diarrhea attributed to the antibiotic, although it may have been a separate viral gastroenteritis. He now says he chose to continue the Augmentin. We were closed first no last week he called the on-call doctor to get prescriptions for doxycycline and Cipro. Prednisone already being taken for his colitis was increased to 40 mg daily for wheezing. Cough was very productive. Today he asks he worked in, reporting more shortness of breath in the last few days "scary". Using  rescue inhaler and resumed Symbicort for modest benefit. He notices some wheezing lying down but not when he is upright.  Arthralgias in  knees, no rash, fever or purulent sputum. He had past history of DVT complicating his colectomy by report. CXR 07/19/12- IMPRESSION:  Central peribronchial thickening, probably indicative of  bronchitis. No frank edema or consolidation.  Original Report Authenticated By: Bretta Bang, M.D.  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 FOLLOWS FOR: was told had blood clot and currently on coumadin now; would like to discuss coming off of it if possible.  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 Lovenox/warfarin lead to rectal bleeding-now on warfarin alone. Followed by hematologist at [facility]. Question this is an old clot -he did have DVT after previous surgery- but probably new/recurrent. No hemoptysis. He feels pneumonia resolved. Chest feels well. Home health nurse following INR. CXR 08/14/12 IMPRESSION:  Persistent patchy opacities in the left upper lobe and right base  dating back to 07/16/2012. Findings likely reflect residual changes  of pneumonia or potentially chronic underlying scarring/fibrosis.  Recommend additional repeat chest x-ray in 2 - 4 weeks. If there  is still no interval change, further evaluation with CT scan of the  chest would likely be warranted to further evaluate.  Original Report Authenticated By: Malachy Moan, M.D.  09/23/12- 33 yoM never smoker followed for asthma, allergic rhinitis, recurrent respiratory infections, Inflammatory bowel disease/ colostomy (Hermansky-Pudlak Syndrome- albinism, colitis, pulmonary fibrosis).Recurrent DVT. ACUTE VISIT: having trouble breathing x 2 weeks(happens with minor exertion) denies any wheezing, cough, or congestion. He started Zyrtec 2 weeks ago. Not using Symbicort "just too many meds" and not wheezing. He notes easy dyspnea  on exertion across a room or a telephone. Denies phlegm or cough, fever, sweats or chills. Feet are not swelling. We discussed anemia given his history of GI bleed. CXR 09/04/12 IMPRESSION:  Persistent streaky airspace/infiltrates bilateral upper and lower  lobe with central distribution. The findings probable due to  persistent pneumonia superimposed on chronic interstitial lung  disease. Slight worsening in aeration right upper lobe. Follow-up  to resolution is recommended. Further evaluation with CT of the  chest could be performed as clinically warranted.  Original Report Authenticated By: Natasha Mead, M.D.  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 Father and wife are here. Follow up to discuss test results.  States o2 sats are staying 88-90% RA during the day. He had an apparent pneumonia in February. Shortness of breath improved with antibiotics been diagnosed with recurrent DVT/PE which is followed by hematology at [facility] with plan to treat with anticoagulation for 3 months. He had a GI bleed. INR has been labile. Now on iron for anemia. In the last few weeks he has felt more short of breath especially with exertion, breathless with speech on room air. Not much effect from his inhalers. He gave disc of his CT scan to pulmonary at [facility]- they have expert on his syndrome. Using the elliptical trainer for exercise, 30 minutes is getting too hard and he has reduced to 10 minute sessions. ONOX- results pending PFT-10/04/2012-mild obstructive airways disease with response to bronchodilator, borderline mild air trapping with increased RV, diffusion severely reduced. FVC 4.30/74%, FEV1 3.45/77%, FEV1/FVC 0.80. TLC 92%, RV 121%, DLCO 40%. CT 4/24 /14  IMPRESSION:  Slightly upper lobe predominant peribronchovascular  fibrosis, as  detailed above. This is new since 07/28/2008.  No evidence of acute superimposed process.  Mild thoracic adenopathy, favored to be reactive.  Original Report Authenticated By: Jeronimo Greaves, M.D.  10/18/12- 29 yoM never smoker followed for asthma, allergic rhinitis, recurrent respiratory infections, Inflammatory bowel disease/ colostomy (Hermansky-Pudlak Syndrome- albinism, colitis, pulmonary fibrosis).Recurrent DVT. Father and wife are here. FOLLOWS FOR: Feels that his breathing has gotten worse. He was exposed to someone who later developed pneumonia P. Reports DOE while on 3L and minor congestion. Denies chest pain or wheezing. Acute Visit, mostly wanting consultation. Severin is noticing a decline in oxygen saturation with easier dyspnea on exertion. Sometimes saturation dips to 88% on 3 L and his heartbeat seems rapid. He has been on clobetasol for active Crohn's disease. Currently off Dapsone and 6 MCP. He remains on warfarin after recurrent DVT.  11/11/12-    Father here FOLLOWS FOR: pt states chest congestion has not improved in last 1 week-- would like to discuss perfinidone options-- would also like to discuss lung tx options--should pt stay away from daughter who will get vaccinations tomorrow On Zithromax. Feeling better. Mucinex and water helps clear mucus. Family is passing around cold. He remains on and clobetasol, but has finished prednisone taper. Wife has had strep throat. CXR 10/18/12-  IMPRESSION:  Stable chronic interstitial lung disease/fibrosis.  Original Report Authenticated By: Judie Petit. Shick, M.D.  01/10/13- 83 yoM never smoker followed for asthma, allergic rhinitis, recurrent respiratory infections, Inflammatory bowel disease/ colostomy (Hermansky-Pudlak Syndrome- albinism, colitis, pulmonary fibrosis).Recurrent DVT. FOLLOWS FOR: ILD. Pt states when he tries to take a deep breathe he has chest pains. This has been going on x 1 week. Also reports increase SOB w/  exertion. He is using 6 liters O2 w/ exertion and 2-3 liters at rest B. [facility] transplant program has told him he must lose 63 pounds, so we can re- refer him at 190 pounds. They also want him off of fentanyl and ativan. He is now working hard at these. He would apparently need a heart and kidney transplant. If he forces deep breaths during exercise he gets diffuse anterior chest pain that seems more related to the deep breath than to the exertion. He has gotten generic Perfenidone/ Perfenex from [place] and is taking 200 mg x 4, TID.    OV 02/19/2013  - switching from Dr Maple Hudson to Dr Marchelle Gearing 48 yoM never smoker followed for asthma, allergic rhinitis, recurrent respiratory infections, Inflammatory bowel disease/ colostomy (Hermansky-Pudlak Syndrome- albinism, colitis, pulmonary fibrosis).Recurrent DVT  Several issues. Overall stable. Now on Pirfenidone from [place].   #Pulmonary Fibrosis - Transplant: - been advised by [facility] that he is overweight and also he needs multi-organ transplant so they rejected his application. He is considering looking at [employer] or [employer]    - Weight loss: he is on a supervised weight hypocaloric weight loss regiment through a ? Surgeon at [employer] through a program called [employer] but he wants a longer term solution. He is afraid to go on green vegetable diet because it will raise his INR  - Treatment:  On pirfenidone. Tolerating well. Wants  cbc, bmet, lft , EKG check today; Qtc 385sec  - Oyxgen need: he wants mask oxygen esp at rehab and at night. Wants special mask and wants [place] do script ft  -Rehab: he wants thresholds liberalized for pulse ox and tachycardia   #Pulmonary EMbolism  - on coumadin. Not  eligble for xarelto due to renal insuff. Unable to go on weight loss with green vegetables due to coumadin intake. Currently feels that weight loss higher priority than clot recurrence due to tranwsplant eligibility. Wants to consider aspirin and lowered   INR goal   OV 05/02/2013 32 yoM never smoker followed for asthma, allergic rhinitis, recurrent respiratory infections, Inflammatory bowel disease/ colostomy (Hermansky-Pudlak Syndrome- albinism, colitis, pulmonary fibrosis).Recurrent DVT   Overall he is doing well. She has lost weight. He says that when he is naked he is 214 pounds. The goal is to get into 200 pounds second eligible for transplant and therefore he wants his transplant referral reinitiated because he is so close. He continues [place] pirfenidone 200mg  x 4 tabs, tid but apparently the age in [place] now wants a prescription from me as a [place] physician [I am also licensed physician in [place]]. He is enjoying rehabilitation but wants to exercise more. He says that time there is insufficient. I've agreed to talk to the rehabilitation people but have said that the significant constraints and he might have to go to a gym. Therefore we will do a letter that he will work out in the gym at his own risk. In terms of pulmonary embolism he is on Apixaban his hematologist and this is working out well for him.  No other new issues. He needs lab work because of his medications  LAbs  Recent Labs Lab 05/02/13 1504  HGB 11.9*  HCT 36.3*  WBC 4.4*  PLT 207.0    Recent Labs Lab 05/02/13 1504  NA 138  K 3.6  CL 111  CO2 19  GLUCOSE 99  BUN 31*  CREATININE 2.3*  CALCIUM 9.1   Estimated Creatinine Clearance: 58.5 ml/min (by C-G formula based on Cr of 2.3).   Recent Labs Lab 05/02/13 1504  AST 29  ALT 30  ALKPHOS 75  BILITOT 0.4  PROT 7.6  ALBUMIN 3.9  #Pulmonary Fibrosis - glad you have lost weight - glad rehab working well for you but I understand the need for you to work out more  - I will talk to pulmonary rehab to see if they can increase your exercise time; doubt it  - MEanwhile, take letter from [place] that you can work out in a gym at your own risk - continue pirfenidone  -  Take script for Pirfenidone (Pirfenex) 200mg   table x 4 tabs, three times a day which at current renal function does not need dose adjustment  - check blood work today for CBC, BMET, LFT  - continue oxygen - continue tussionex for cough; take 1 current + 1 post dated prescriptions - given yoru weight loss; we will reinitiate [facility] tras[facility]plant referral - full PFT at followup  #Pulmonary EMbolism - no need for aspirin  - continue Eliquis per hematologist; current renal function does not require dose adjustment   #Followup -  8 weeks with full PFT at followup  OV 05/23/2013 - ACUTE VISIT Acute visit. Week ago he called in saying that he had bradycardia to a heart rate of 40. There was concern that this might be related to his pulmonary fibrosis drug Pirfenidone which he gets from [place]. However, he and his wife found out that she was giving him excess clonidine is being given to help him come off fentanyl patch. After that issue was corrected his heart rate is now normal. Infected he did today shows normal heart rate of 69 which is baseline along with normal  QT interval.  He is additional questions  Regarding his interstitial lung disease/pulmonary fibrosis. He is wondering if his diagnosis could be changed to IPF so he can make use of [place] made pirfenidone through insurance. I have refusedt o label him as IPF.  His export/input person who gets from [place] is now becoming unreliable. I've offered to contact my contacts in [place] . It also appears that [facility] is now reevaluating him for transplant . He did have some sunburn and is now applying his sunscreen diligently   regarding cough: He was to add Tessalon to his Tussionex   Review of Systems  Constitutional: Negative for fever and unexpected weight change.  HENT: Negative for congestion, dental problem, ear pain, nosebleeds, postnasal drip, rhinorrhea, sinus pressure, sneezing, sore throat and trouble swallowing.   Eyes: Negative for redness and itching.  Respiratory:  Positive for shortness of breath. Negative for cough, chest tightness and wheezing.   Cardiovascular: Negative for palpitations and leg swelling.       Decreased heart rate  Gastrointestinal: Negative for nausea and vomiting.  Genitourinary: Negative for dysuria.  Musculoskeletal: Negative for joint swelling.  Skin: Negative for rash.  Neurological: Negative for headaches.  Hematological: Does not bruise/bleed easily.  Psychiatric/Behavioral: Negative for dysphoric mood. The patient is not nervous/anxious.        Objective:   Physical Exam  Skin- +Albino.  Lymphadenopathy- none Head- atraumatic            Eyes-  PERRLA, conjunctivae and secretions clear. +Strabismus, +nystagmus            Ears- Hearing, canals-normal            Nose- Clear, no-Septal dev, mucus, polyps, erosion, perforation             Throat- Mallampati II , mucosa clear , drainage- none, tonsils- atrophic Neck- flexible , trachea midline, no stridor , thyroid nl, carotid no bruit Chest - symmetrical excursion , unlabored           Heart/CV- RRR , no murmur , no gallop  , no rub, nl s1 s2                           - JVD- none , edema-none, stasis changes- none, varices- none           Lung- + trace crackles heard, wheeze- none, cough-none , dullness-none, rub- none.            Chest wall- ? Tender to pressure sternum Abd- +colostomy Gen/ Rectal- Not done, not indicated      Assessment & Plan:

## 2013-05-26 NOTE — Assessment & Plan Note (Signed)
Low hear rate:  -  EKG is at baseline now low heart rate related clonikdine likely  Cough:   - Take tessalon 200mg  three times a day in addition to tussionex 5ml twice daily  ILD:   - [facility] up with [facility]  - I will write to my contacts in [place] about pirfenidone export to [place]  - continue [place] pirfenidone (pirfenex); always apply sun screen  Followup  - as before   > 50% of this > 25 min visit spent in face to face counseling (15 min visit converted to 25 min)

## 2013-05-27 ENCOUNTER — Ambulatory Visit (INDEPENDENT_AMBULATORY_CARE_PROVIDER_SITE_OTHER): Payer: 59 | Admitting: Professional

## 2013-05-27 ENCOUNTER — Encounter (HOSPITAL_COMMUNITY)
Admission: RE | Admit: 2013-05-27 | Discharge: 2013-05-27 | Disposition: A | Discharge status: Home / Self Care | Payer: 59 | Source: Ambulatory Visit | Attending: Internal Medicine | Admitting: Internal Medicine

## 2013-05-27 DIAGNOSIS — F4323 Adjustment disorder with mixed anxiety and depressed mood: Secondary | ICD-10-CM

## 2013-05-29 ENCOUNTER — Ambulatory Visit (INDEPENDENT_AMBULATORY_CARE_PROVIDER_SITE_OTHER): Payer: 59 | Admitting: Internal Medicine

## 2013-05-29 ENCOUNTER — Encounter (HOSPITAL_COMMUNITY)
Admission: RE | Admit: 2013-05-29 | Discharge: 2013-05-29 | Disposition: A | Discharge status: Home / Self Care | Payer: 59 | Source: Ambulatory Visit | Attending: Internal Medicine | Admitting: Internal Medicine

## 2013-05-29 ENCOUNTER — Encounter (HOSPITAL_COMMUNITY): Payer: Self-pay

## 2013-05-29 DIAGNOSIS — J8410 Pulmonary fibrosis, unspecified: Secondary | ICD-10-CM

## 2013-05-29 DIAGNOSIS — Z8701 Personal history of pneumonia (recurrent): Secondary | ICD-10-CM

## 2013-05-29 DIAGNOSIS — J849 Interstitial pulmonary disease, unspecified: Secondary | ICD-10-CM

## 2013-05-29 LAB — PULMONARY FUNCTION TEST
DL/VA: 2.76 ml/min/mmHg/L
DLCO unc % pred: 39 %
DLCO unc: 14.36 ml/min/mmHg
FEF 25-75 Post: 3.07 L/sec
FEF 25-75 Pre: 3.08 L/sec
FEF2575-%Pred-Post: 65 %
FEV1-Pre: 3.32 L
FEV1FVC-%Pred-Pre: 98 %
FEV6-%Pred-Post: 72 %
FEV6-Post: 4.25 L
FEV6FVC-%Change-Post: 0 %
FEV6FVC-%Pred-Post: 101 %
FEV6FVC-%Pred-Pre: 101 %
FVC-%Change-Post: 2 %
FVC-%Pred-Pre: 69 %
FVC-Post: 4.26 L
Pre FEV1/FVC ratio: 80 %
TLC: 5.36 L

## 2013-05-29 NOTE — Progress Notes (Signed)
PFT done today. 

## 2013-05-29 NOTE — Progress Notes (Signed)
[name] completed a Six-Minute Walk Test on 05/29/13 . [name] walked 1583 feet with 0 breaks.  The patient's lowest oxygen saturation was 97% , highest heart rate was 156 , and highest blood pressure was 130/82. The patient was on 15 liters.

## 2013-05-30 ENCOUNTER — Telehealth: Payer: Self-pay | Admitting: Internal Medicine

## 2013-05-30 ENCOUNTER — Encounter (HOSPITAL_COMMUNITY): Payer: 59

## 2013-05-30 NOTE — Telephone Encounter (Signed)
Spoke with the pt and notified that MR out of the office until 06/02/13 and will have him review the results then  Please advise on PFT thanks!

## 2013-06-02 ENCOUNTER — Encounter (HOSPITAL_COMMUNITY): Payer: 59

## 2013-06-03 ENCOUNTER — Ambulatory Visit (INDEPENDENT_AMBULATORY_CARE_PROVIDER_SITE_OTHER): Payer: 59 | Admitting: Professional

## 2013-06-03 ENCOUNTER — Encounter (HOSPITAL_COMMUNITY)
Admission: RE | Admit: 2013-06-03 | Discharge: 2013-06-03 | Disposition: A | Discharge status: Home / Self Care | Payer: 59 | Source: Ambulatory Visit | Attending: Internal Medicine | Admitting: Internal Medicine

## 2013-06-03 ENCOUNTER — Encounter: Payer: Self-pay | Admitting: Internal Medicine

## 2013-06-03 DIAGNOSIS — F4323 Adjustment disorder with mixed anxiety and depressed mood: Secondary | ICD-10-CM

## 2013-06-04 ENCOUNTER — Encounter (HOSPITAL_COMMUNITY): Payer: 59

## 2013-06-04 NOTE — Telephone Encounter (Signed)
PFT 05/29/13  FVC  4.16L/69% -> post BD 4.26L/71%  (jn aprl Dr Maple Hudson noted it was 4.3L, so this is same) TLC 5.36L/71% (in April it says 92% but I do not have actual number with me) DLCO 14.3/39% (in April Dr Maple Hudson noted it ws 40%, so I would say same)  Overall  Shows moderate severity ILD in Dec 2014 PFT. I am not sure if is worse than April 2014 but possibly same.   Further querstions to be addressed at face to face followup   Dr. Kalman Shan, M.D., [facility].C.P Pulmonary and Critical Care Medicine Staff Physician  System [facility] Pager: [phone], If no answer or between  15:00h - 7:00h: call [phone]  06/04/2013 12:41 PM

## 2013-06-05 ENCOUNTER — Encounter (HOSPITAL_COMMUNITY): Payer: 59

## 2013-06-06 NOTE — Telephone Encounter (Signed)
See pt email 06/03/13. Pt made aware via mychart. Will sign off message

## 2013-06-10 ENCOUNTER — Ambulatory Visit (INDEPENDENT_AMBULATORY_CARE_PROVIDER_SITE_OTHER): Payer: 59 | Admitting: Professional

## 2013-06-10 ENCOUNTER — Encounter (HOSPITAL_COMMUNITY)
Admission: RE | Admit: 2013-06-10 | Discharge: 2013-06-10 | Disposition: A | Discharge status: Home / Self Care | Payer: 59 | Source: Ambulatory Visit | Attending: Internal Medicine | Admitting: Internal Medicine

## 2013-06-10 DIAGNOSIS — F4323 Adjustment disorder with mixed anxiety and depressed mood: Secondary | ICD-10-CM

## 2013-06-19 ENCOUNTER — Encounter (HOSPITAL_COMMUNITY)
Admission: RE | Admit: 2013-06-19 | Discharge: 2013-06-19 | Disposition: A | Discharge status: Home / Self Care | Payer: 59 | Source: Ambulatory Visit | Attending: Internal Medicine | Admitting: Internal Medicine

## 2013-06-19 DIAGNOSIS — Z5189 Encounter for other specified aftercare: Secondary | ICD-10-CM | POA: Insufficient documentation

## 2013-06-19 DIAGNOSIS — Z86718 Personal history of other venous thrombosis and embolism: Secondary | ICD-10-CM | POA: Insufficient documentation

## 2013-06-19 DIAGNOSIS — Z79899 Other long term (current) drug therapy: Secondary | ICD-10-CM | POA: Insufficient documentation

## 2013-06-19 DIAGNOSIS — J45909 Unspecified asthma, uncomplicated: Secondary | ICD-10-CM | POA: Insufficient documentation

## 2013-06-24 ENCOUNTER — Ambulatory Visit: Payer: 59 | Admitting: Internal Medicine

## 2013-06-24 ENCOUNTER — Encounter (HOSPITAL_COMMUNITY): Payer: 59

## 2013-06-26 ENCOUNTER — Encounter (HOSPITAL_COMMUNITY)
Admission: RE | Admit: 2013-06-26 | Discharge: 2013-06-26 | Disposition: A | Discharge status: Home / Self Care | Payer: 59 | Source: Ambulatory Visit | Attending: Internal Medicine | Admitting: Internal Medicine

## 2013-07-01 ENCOUNTER — Telehealth: Payer: Self-pay | Admitting: Internal Medicine

## 2013-07-01 ENCOUNTER — Encounter: Payer: Self-pay | Admitting: Internal Medicine

## 2013-07-01 ENCOUNTER — Encounter (HOSPITAL_COMMUNITY)
Admission: RE | Admit: 2013-07-01 | Discharge: 2013-07-01 | Disposition: A | Discharge status: Home / Self Care | Payer: 59 | Source: Ambulatory Visit | Attending: Internal Medicine | Admitting: Internal Medicine

## 2013-07-01 ENCOUNTER — Ambulatory Visit (INDEPENDENT_AMBULATORY_CARE_PROVIDER_SITE_OTHER): Payer: 59 | Admitting: Professional

## 2013-07-01 ENCOUNTER — Ambulatory Visit (INDEPENDENT_AMBULATORY_CARE_PROVIDER_SITE_OTHER): Payer: 59 | Admitting: Internal Medicine

## 2013-07-01 ENCOUNTER — Ambulatory Visit (INDEPENDENT_AMBULATORY_CARE_PROVIDER_SITE_OTHER)
Admission: RE | Admit: 2013-07-01 | Discharge: 2013-07-01 | Disposition: A | Discharge status: Home / Self Care | Payer: 59 | Source: Ambulatory Visit | Attending: Internal Medicine | Admitting: Internal Medicine

## 2013-07-01 VITALS — BP 120/82 | HR 120 | Ht 73.0 in | Wt 215.2 lb

## 2013-07-01 DIAGNOSIS — J8410 Pulmonary fibrosis, unspecified: Secondary | ICD-10-CM

## 2013-07-01 DIAGNOSIS — R079 Chest pain, unspecified: Secondary | ICD-10-CM

## 2013-07-01 DIAGNOSIS — F4323 Adjustment disorder with mixed anxiety and depressed mood: Secondary | ICD-10-CM

## 2013-07-01 DIAGNOSIS — G894 Chronic pain syndrome: Secondary | ICD-10-CM

## 2013-07-01 DIAGNOSIS — J849 Interstitial pulmonary disease, unspecified: Secondary | ICD-10-CM

## 2013-07-01 MED ORDER — HYDROCOD POLST-CHLORPHEN POLST 10-8 MG/5ML PO LQCR: 5.0000 mL | Freq: Two times a day (BID) | ORAL | Status: DC | PRN

## 2013-07-01 NOTE — Progress Notes (Signed)
Subjective:    Patient ID: [name], male    DOB: January 02, 1981, 33 y.o.   MRN: [id number] PCP [name], MD Heme-ONc - [name] - [name] + [facility] (Was followed by Dr [name] through Sept 2014 and then switched to Dr Elly Modenaamswamy) Renal:  DR [name] your nephrologist  HPI  # asthma, allergic rhinitis NOS  # recurrent respiratory infections NOS  #Venous Thrombo-Embolism- Recurrent D[facility]: [facility]  - ? First episode after colectomy in 2012  - Doppler- Had L popliteal vein DVT on 07/30/12  - 02/26/13  = POSITIVE DVT: acute deep vein thrombosis involving the left popliteal and distal femoral veins.  - [facility] oct[facility]  #Chronic Renal Insufficiency  - Creat 2.3mg % - Nov 2014  - GFR 44 [facility] patient [facility] Nculear scan [facility]  #obesity  - Body mass index is 28.4 kg/(m^2). on 07/17/2013: this is a result of aggressie weight loss program since fall 2014  - on coumadin   #REcurrent bronchitis/respiratory tract infection    #Chronic Pain ad chronic benzo  - fentanyl patch  #Hermansky-Pudlak Syndrome  #Inflammatory bowel disease; Chrons' - s/p Total colectomy 2012   # albinism,     #Pulmonary fibrosis. - new since spring 2014   -  April 2014: FVC 4.30/74%, FEV1 3.45/77%, FEV1/FVC 0.80. TLC 92%, RV 121%, DLCO 40%.   -  CT 10/03/12 : IMPRESSION: Slightly upper lobe predominant peribronchovascular fibrosis, as  detailed above. This is new since 07/28/2008   - May 2014: 3L with exertion  - - Aug 2014: 6L with exertion. 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 I am not sure if is worse than April 2014 but possibly same.   [name].   OV 07/01/2013  Chief Complaint  Patient presents with  . Follow-up    for ILD.  Pt is c/o having pain in chest. He has been cutting down on fentanyl, he is currently not on any.    Here with dad  Overall stable. Main issue is that he just found out an hour ago that [facility] as [facility] risk because he woul need lung and kidney transplant. He is upset. He says he wants to die fighthing on an operating table. He wants my help to look at other transplant centers esp [facility] and [facility]. He says he has done all he can to get a transplant.   He is off fent patches and his joint pain is worse; wants referral to acupuncture  In terms of ILD: he is having supply problems from [place]. Wants me to write to [employer] to see if they will give him pirfenidone. Cough persists. Wants refills  Review of Systems  Constitutional: Negative for fever and unexpected weight change.  HENT: Negative for congestion, dental problem, ear pain, nosebleeds, postnasal drip, rhinorrhea, sinus pressure, sneezing, sore throat and trouble swallowing.   Eyes: Negative for  redness and itching.  Respiratory: Positive for shortness of breath. Negative for cough, chest tightness and wheezing.   Cardiovascular: Positive for chest pain. Negative for palpitations and leg swelling.  Gastrointestinal: Negative for nausea and vomiting.  Genitourinary: Negative for dysuria.  Musculoskeletal: Negative for joint swelling.  Skin: Negative for rash.  Neurological: Negative for headaches.  Hematological: Does not bruise/bleed easily.  Psychiatric/Behavioral: Negative for dysphoric mood. The patient is not nervous/anxious.        Objective:   Physical Exam  Nursing note and vitals reviewed. Constitutional: He is oriented to person, place, and time. He appears well-developed and  well-nourished. No distress.  Body mass index is 28.4 kg/(m^2).   HENT:  Head: Normocephalic and atraumatic.  Right Ear: External ear normal.  Left Ear: External ear normal.  Mouth/Throat: Oropharynx is clear and moist. No oropharyngeal exudate.  Poor vision O2 on  Eyes: Conjunctivae and EOM are normal. Pupils are equal, round, and reactive to light. Right eye exhibits no discharge. Left eye exhibits no discharge. No scleral icterus.  Neck: Normal range of motion. Neck supple. No JVD present. No tracheal deviation present. No thyromegaly present.  Cardiovascular: Normal rate, regular rhythm and intact distal pulses.  Exam reveals no gallop and no friction rub.   No murmur heard. Pulmonary/Chest: Effort normal and breath sounds normal. No respiratory distress. He has no wheezes. He has no rales. He exhibits no tenderness.  Some crackles  Abdominal: Soft. Bowel sounds are normal. He exhibits no distension and no mass. There is no tenderness. There is no rebound and no guarding.  Musculoskeletal: Normal range of motion. He exhibits no edema and no tenderness.  Lymphadenopathy:    He has no cervical adenopathy.  Neurological: He is alert and oriented to person, place, and time. He has normal reflexes. No cranial nerve deficit. Coordination normal.  Skin: Skin is warm and dry. No rash noted. He is not diaphoretic. No erythema. No pallor.  albino  Psychiatric: He has a normal mood and affect. His behavior is normal. Judgment and thought content normal.          Assessment & Plan:

## 2013-07-01 NOTE — Telephone Encounter (Signed)
07/01/2013  Pt is needing a PA on rx apixaban (ELIQUIS) 5 MG TABS.  Pt states that the pharmacy and insurance has been sending us PA's via fax.

## 2013-07-01 NOTE — Patient Instructions (Addendum)
Cough:   - Take tessalon 200mg  three times a day in addition to tussionex 5ml twice daily  ILD:   - too bad [facility] declined lung/kidney transplant  -  I will write to my contacts in [place] again about pirfenidone export to [place]  - I will check with [employer] rep about [place] pirfenidone for you  - continue [place] pirfenidone (pirfenex); always apply sun screen  - Will write to contacts [facility] and [facility] about transplant for you  - followup with DR [name] your nephrologist  - get CXR 2 view now due to pain in chest  #chronic pain (chest and joints/knees)  - this is worse after reduction in fentanyl - please try Acupuncture  - [address] [facility] [address], [place], [place] [address]   - [phone]  Followup  -2 months  - spirometry at followup  Body mass index is 28.4 kg/(m^2).

## 2013-07-02 ENCOUNTER — Encounter (HOSPITAL_COMMUNITY)
Admission: RE | Admit: 2013-07-02 | Discharge: 2013-07-02 | Disposition: A | Discharge status: Home / Self Care | Payer: 59 | Source: Ambulatory Visit | Attending: Internal Medicine | Admitting: Internal Medicine

## 2013-07-02 NOTE — Telephone Encounter (Signed)
CMA out of office 06/24/13 through 06/27/13. PA's all caught up as of 07/01/13, as of today still no request or fax received, will contact patient and inform.

## 2013-07-03 ENCOUNTER — Encounter (HOSPITAL_COMMUNITY): Payer: 59

## 2013-07-03 NOTE — Telephone Encounter (Signed)
PA for Eliquis submitted to [employer] via covermymeds. Thanks

## 2013-07-04 NOTE — Telephone Encounter (Signed)
Received fax response back from [employer] that advised PA is not required at this time for Eliquis. Per fax, pharmacy must call help desk at [phone] for assistance to process claim. Pharmacy notified and document scanned.

## 2013-07-08 ENCOUNTER — Encounter (HOSPITAL_COMMUNITY): Payer: 59

## 2013-07-08 ENCOUNTER — Ambulatory Visit (INDEPENDENT_AMBULATORY_CARE_PROVIDER_SITE_OTHER): Payer: 59 | Admitting: Professional

## 2013-07-08 DIAGNOSIS — F4323 Adjustment disorder with mixed anxiety and depressed mood: Secondary | ICD-10-CM

## 2013-07-09 ENCOUNTER — Encounter (HOSPITAL_COMMUNITY)
Admission: RE | Admit: 2013-07-09 | Discharge: 2013-07-09 | Disposition: A | Discharge status: Home / Self Care | Payer: 59 | Source: Ambulatory Visit | Attending: Internal Medicine | Admitting: Internal Medicine

## 2013-07-10 ENCOUNTER — Encounter (HOSPITAL_COMMUNITY): Payer: 59

## 2013-07-15 ENCOUNTER — Encounter (HOSPITAL_COMMUNITY): Payer: 59

## 2013-07-15 ENCOUNTER — Ambulatory Visit (INDEPENDENT_AMBULATORY_CARE_PROVIDER_SITE_OTHER): Payer: 59 | Admitting: Professional

## 2013-07-15 ENCOUNTER — Ambulatory Visit: Payer: Medicare Other | Admitting: Internal Medicine

## 2013-07-15 DIAGNOSIS — J45909 Unspecified asthma, uncomplicated: Secondary | ICD-10-CM | POA: Insufficient documentation

## 2013-07-15 DIAGNOSIS — F4323 Adjustment disorder with mixed anxiety and depressed mood: Secondary | ICD-10-CM

## 2013-07-15 DIAGNOSIS — Z79899 Other long term (current) drug therapy: Secondary | ICD-10-CM | POA: Insufficient documentation

## 2013-07-15 DIAGNOSIS — Z86718 Personal history of other venous thrombosis and embolism: Secondary | ICD-10-CM | POA: Insufficient documentation

## 2013-07-15 DIAGNOSIS — Z5189 Encounter for other specified aftercare: Secondary | ICD-10-CM | POA: Insufficient documentation

## 2013-07-16 ENCOUNTER — Encounter (HOSPITAL_COMMUNITY)
Admission: RE | Admit: 2013-07-16 | Discharge: 2013-07-16 | Disposition: A | Discharge status: Home / Self Care | Payer: 59 | Source: Ambulatory Visit | Attending: Internal Medicine | Admitting: Internal Medicine

## 2013-07-16 ENCOUNTER — Telehealth: Payer: Self-pay | Admitting: Internal Medicine

## 2013-07-16 NOTE — Telephone Encounter (Signed)
Please call him with following answers  1. [employer] cannot provide him Pirfenidone. His disease is an exclusion  2. Re Transplant: Dr Rico JunkerGary M Hunninghake of [facility] and while is true they have transplanted HPS patients they are scared to take on risk of someone with his renal disease. He feels [facility] might be the only place willing to take some risk. I spoke to contact at [facility] Dr Vinnie LevelSahoo and he agree very high risk situation for transplant but beest patient Jeremy [name] [name] go to [facility] website and look at out of town inter-disciplonary consultation visit. He has to work on getting transplant, renal, and Pulmonary -ILD specialist to see him all at one time or something like that  3. Regarding [place] prifenidone: I have had no luck   Dr. Kalman ShanMurali Zayn Selley, M.D., [facility] Pulmonary and Critical Care Medicine Staff Physician [facility] Pulmonary and Critical Care Pager: [phone], If no answer or between  15:00h - 7:00h: call [phone]  07/16/2013 10:45 PM

## 2013-07-17 ENCOUNTER — Encounter (HOSPITAL_COMMUNITY): Payer: 59

## 2013-07-17 DIAGNOSIS — G894 Chronic pain syndrome: Secondary | ICD-10-CM | POA: Insufficient documentation

## 2013-07-17 NOTE — Assessment & Plan Note (Addendum)
Cough:   - Take tessalon 200mg  three times a day in addition to tussionex 5ml twice daily  ILD:   - too bad [facility] declined lung/kidney transplant  -  I will write to my contacts in [place] again about pirfenidone export to [place]  - I will check with [employer] rep about [place] pirfenidone for you  - continue [place] pirfenidone (pirfenex); always apply sun screen  - Will write to contacts at [facility] and [facility] about transplant for you  - followup with DR [name] your nephrologist  - get CXR 2 view now due to pain in chest Followup  -2 months  - spirometry at followup  > 50% of this > 25 min visit spent in face to face counseling (15 min visit converted to 25 min)

## 2013-07-17 NOTE — Assessment & Plan Note (Signed)
#  chronic pain (chest and joints/knees)  - this is worse after reduction in fentanyl - please try Acupuncture  - [address] [facility] [address], [place], [place] [address]   - [phone]   > 50% of this > 25 min visit spent in face to face counseling (15 min visit converted to 25 min)

## 2013-07-22 ENCOUNTER — Ambulatory Visit (INDEPENDENT_AMBULATORY_CARE_PROVIDER_SITE_OTHER): Payer: 59 | Admitting: Professional

## 2013-07-22 ENCOUNTER — Encounter (HOSPITAL_COMMUNITY)
Admission: RE | Admit: 2013-07-22 | Discharge: 2013-07-22 | Disposition: A | Discharge status: Home / Self Care | Payer: 59 | Source: Ambulatory Visit | Attending: Internal Medicine | Admitting: Internal Medicine

## 2013-07-22 DIAGNOSIS — F4323 Adjustment disorder with mixed anxiety and depressed mood: Secondary | ICD-10-CM

## 2013-07-23 ENCOUNTER — Encounter (HOSPITAL_COMMUNITY)
Admission: RE | Admit: 2013-07-23 | Discharge: 2013-07-23 | Disposition: A | Discharge status: Home / Self Care | Payer: 59 | Source: Ambulatory Visit | Attending: Internal Medicine | Admitting: Internal Medicine

## 2013-07-24 ENCOUNTER — Encounter (HOSPITAL_COMMUNITY): Payer: 59

## 2013-07-25 NOTE — Telephone Encounter (Signed)
LMTCBx1.Cherry Wittwer, CMA  

## 2013-07-29 ENCOUNTER — Encounter (HOSPITAL_COMMUNITY): Payer: 59

## 2013-07-29 ENCOUNTER — Ambulatory Visit: Payer: 59 | Admitting: Professional

## 2013-07-30 ENCOUNTER — Encounter: Payer: Self-pay | Admitting: Internal Medicine

## 2013-07-30 ENCOUNTER — Encounter (HOSPITAL_COMMUNITY): Payer: 59

## 2013-07-30 NOTE — Telephone Encounter (Signed)
Please advise on refill request.  Thanks.

## 2013-07-30 NOTE — Telephone Encounter (Signed)
MyChart message has been sent to the pt.

## 2013-07-31 ENCOUNTER — Encounter (HOSPITAL_COMMUNITY)
Admission: RE | Admit: 2013-07-31 | Discharge: 2013-07-31 | Disposition: A | Discharge status: Home / Self Care | Payer: 59 | Source: Ambulatory Visit | Attending: Internal Medicine | Admitting: Internal Medicine

## 2013-08-01 ENCOUNTER — Telehealth: Payer: Self-pay | Admitting: Internal Medicine

## 2013-08-01 NOTE — Telephone Encounter (Signed)
Called and spoke with pt and he has several concerns:   Would like to speak with MR directly about these---  1.  Pt stated that [facility] has denied his transplant---pt stated that he thought we were going to wait on this so he would like to know how MR contacted them about this transplant and what information was sent over to them.   2.  Pt stated that the transplant is very important to him.    He stated that [facility] had denied the transplant to him---pt stated that he needs a lung/kidney transplant but the records that were sent to [facility] stated that he needed a lung/liver transplant and pt wanted to know if this information is in his records and if so, this needs to be corrected because this is not the transplant that he needs. He stated that this was listed on the front page of the [facility].    3.  Pt stated that he needs a rx for tussionex since he is out and would like this mailed to his address.  Confirmed address with pt.   4.  Pt would still like to pursue the pirfenidone ioff label with insurance.    5.  Pt would like copies of his medical records that we would send out for a referral to [facility] or any other facility.  Pt stated that he is putting together a packet that he will be able to send out about transplants to other facilities and stated that he would like to have what MR would normally send to these places.    MR please advise. thanks

## 2013-08-01 NOTE — Telephone Encounter (Signed)
Please have him come in nextw week to discuss.  I have new slots

## 2013-08-04 NOTE — Telephone Encounter (Signed)
atc pt. Was advised mailbox is full and can't leave a message. wcb

## 2013-08-05 ENCOUNTER — Ambulatory Visit: Payer: 59 | Admitting: Professional

## 2013-08-05 ENCOUNTER — Encounter (HOSPITAL_COMMUNITY): Payer: 59

## 2013-08-06 ENCOUNTER — Encounter (HOSPITAL_COMMUNITY): Admission: RE | Admit: 2013-08-06 | Payer: 59 | Source: Ambulatory Visit

## 2013-08-06 MED ORDER — HYDROCOD POLST-CHLORPHEN POLST 10-8 MG/5ML PO LQCR: 5.0000 mL | Freq: Two times a day (BID) | ORAL | Status: DC | PRN

## 2013-08-06 NOTE — Telephone Encounter (Signed)
I have mailed tussionex for the pt per his requests. I will continue to work on other requests.Carron CurieJennifer Castillo, CMA

## 2013-08-06 NOTE — Telephone Encounter (Signed)
He has sent a my chart message too. So here are the answers. Is best he came in for a visit  1./ [name] - I spoke on phone to a Dr Zola ButtonHunninghake who then spoke to the transplant team. Did this over phone. Because of kidney issues he said patient is not a candidate. Of course this is unofficial and if patient wants to try he can make an official application with them. BUt will be a 100% wasted effort  2. Who sent the referral to [facility] saying lung/liver? IT does not matter anyways. [place] correcting that on what we sent to [facility] makes no material difference now. They know it was a lung/kidney   3. Give him tussionex 5mL bid x 30 days  4. Pirfenidone: HE has been told before that neither I nor [employer] company will give him pirfenidone. HE can try through another pulmonologist but no one in this practice will prescribe it for him  5. Other facilities: I told him to get working on [facility] inter-disciplinary out of state visit where different docs can meet with him at same tiime from different specialties. [facility] has the most aggressive transplant program that I know of and he should first go through an evaluation through that clinic. Has he started on that?  6. He wanted a letter summarizing his medical problems. Please do the following and print it out and give to me for signature   # asthma, allergic rhinitis NOS  # recurrent respiratory infections NOS   #Venous Thrombo-Embolism- Recurrent [facility]  - ? First episode after colectomy in 2012  - Doppler- Had L popliteal vein DVT on 07/30/12  - 02/26/13 = POSITIVE DVT: acute deep vein thrombosis involving the left popliteal and distal femoral veins. - [facility]   #Chronic Renal Insufficiency  - Creat 2.3mg % - Nov 2014  - [facility] N[facility]   #obesity  - Body mass index is 28.4 kg/(m^2). on 07/17/2013: this is a result of aggressie weight  loss program since fall 2014  - on coumadin   #REcurrent bronchitis/respiratory tract infection   #Chronic Pain ad chronic benzo  - fentanyl patch ; trying to wean off Feb 2015  #Hermansky-Pudlak Syndrome   #Inflammatory bowel disease; Chrons' - s/p Total colectomy 2012   # albinism,   #Pulmonary fibrosis. - new since spring 2014  - April 2014: FVC 4.30/74%, FEV1 3.45/77%, FEV1/FVC 0.80. TLC 92%, RV 121%, DLCO 40%.  - CT 10/03/12 : IMPRESSION: Slightly upper lobe predominant peribronchovascular fibrosis, as  detailed above. This is new since 07/28/2008  - May 2014: 3L with exertion  - - Aug 2014: 6L with exertion. sTarted pirfenidone from [place], 200mg  x 4, TID \  - Nov 2014: Tussionex for cough  - [facility]ected for [facility] kidney transplant at [facility] JAn 2015  -  PFT 05/29/13  FVC 4.16L/69% -> post BD 4.26L/71% (jn aprl Dr [name] noted it was 4.3L, so this is same)  TLC 5.36L/71% (in April it says 92% but I do not have actual number with me)  DLCO 14.3/39% (in April Dr [name] noted it ws 40%, so I would say same)  moderate severity ILD in Dec 2014 PFT. I am not sure if is worse than April 2014 but possibly same    Thanks  Dr. Kalman ShanMurali Dantae Meunier, M.D., [facility].P Pulmonary and Critical Care Medicine Staff Physician [facility] Pulmonary  and Critical Care Pager: [phone], If no answer or between  15:00h - 7:00h: call [phone]  08/06/2013 6:15 AM

## 2013-08-07 ENCOUNTER — Encounter (HOSPITAL_COMMUNITY): Payer: 59

## 2013-08-08 ENCOUNTER — Encounter: Payer: Self-pay | Admitting: Internal Medicine

## 2013-08-12 ENCOUNTER — Encounter (HOSPITAL_COMMUNITY): Payer: 59

## 2013-08-12 ENCOUNTER — Ambulatory Visit (INDEPENDENT_AMBULATORY_CARE_PROVIDER_SITE_OTHER): Payer: 59 | Admitting: Professional

## 2013-08-12 DIAGNOSIS — Z5189 Encounter for other specified aftercare: Secondary | ICD-10-CM | POA: Insufficient documentation

## 2013-08-12 DIAGNOSIS — F4323 Adjustment disorder with mixed anxiety and depressed mood: Secondary | ICD-10-CM

## 2013-08-12 DIAGNOSIS — J45909 Unspecified asthma, uncomplicated: Secondary | ICD-10-CM | POA: Insufficient documentation

## 2013-08-12 DIAGNOSIS — Z86718 Personal history of other venous thrombosis and embolism: Secondary | ICD-10-CM | POA: Insufficient documentation

## 2013-08-12 DIAGNOSIS — Z79899 Other long term (current) drug therapy: Secondary | ICD-10-CM | POA: Insufficient documentation

## 2013-08-13 ENCOUNTER — Encounter (HOSPITAL_COMMUNITY): Payer: 59

## 2013-08-13 NOTE — Telephone Encounter (Signed)
I sent the below information to the patient via mychart email. I will continue to work on a letter. Carron CurieJennifer Caydance Kuehnle, CMA

## 2013-08-14 ENCOUNTER — Encounter (HOSPITAL_COMMUNITY): Payer: 59

## 2013-08-19 ENCOUNTER — Encounter (HOSPITAL_COMMUNITY): Payer: 59

## 2013-08-20 ENCOUNTER — Encounter (HOSPITAL_COMMUNITY): Payer: 59

## 2013-08-21 ENCOUNTER — Encounter (HOSPITAL_COMMUNITY): Payer: 59

## 2013-08-22 ENCOUNTER — Encounter: Payer: Self-pay | Admitting: *Deleted

## 2013-08-22 NOTE — Telephone Encounter (Signed)
Letter completed and I will have MR sign on Monday and mail to the pt. Carron CurieJennifer Jaquez Farrington, CMA

## 2013-08-25 NOTE — Telephone Encounter (Signed)
Letter signed and mailed to pt. Jeremy CurieJennifer Reanna [name], CMA

## 2013-08-26 ENCOUNTER — Ambulatory Visit (INDEPENDENT_AMBULATORY_CARE_PROVIDER_SITE_OTHER): Payer: 59 | Admitting: Professional

## 2013-08-26 ENCOUNTER — Encounter (HOSPITAL_COMMUNITY): Payer: 59

## 2013-08-26 DIAGNOSIS — F4323 Adjustment disorder with mixed anxiety and depressed mood: Secondary | ICD-10-CM

## 2013-08-27 ENCOUNTER — Encounter (HOSPITAL_COMMUNITY): Payer: 59

## 2013-08-28 ENCOUNTER — Encounter (HOSPITAL_COMMUNITY)
Admission: RE | Admit: 2013-08-28 | Discharge: 2013-08-28 | Disposition: A | Discharge status: Home / Self Care | Payer: 59 | Source: Ambulatory Visit | Attending: Internal Medicine | Admitting: Internal Medicine

## 2013-08-29 ENCOUNTER — Other Ambulatory Visit (INDEPENDENT_AMBULATORY_CARE_PROVIDER_SITE_OTHER): Payer: 59

## 2013-08-29 ENCOUNTER — Encounter: Payer: Self-pay | Admitting: Internal Medicine

## 2013-08-29 ENCOUNTER — Ambulatory Visit (INDEPENDENT_AMBULATORY_CARE_PROVIDER_SITE_OTHER): Payer: 59 | Admitting: Internal Medicine

## 2013-08-29 VITALS — BP 140/88 | HR 88 | Ht 73.0 in | Wt 224.0 lb

## 2013-08-29 DIAGNOSIS — J8410 Pulmonary fibrosis, unspecified: Secondary | ICD-10-CM

## 2013-08-29 DIAGNOSIS — N189 Chronic kidney disease, unspecified: Secondary | ICD-10-CM

## 2013-08-29 DIAGNOSIS — J849 Interstitial pulmonary disease, unspecified: Secondary | ICD-10-CM

## 2013-08-29 LAB — BASIC METABOLIC PANEL
BUN: 26 mg/dL — ABNORMAL HIGH (ref 6–23)
CHLORIDE: 110 meq/L (ref 96–112)
CO2: 23 mEq/L (ref 19–32)
CREATININE: 1.8 mg/dL — AB (ref 0.4–1.5)
Calcium: 9 mg/dL (ref 8.4–10.5)
GFR: 47.6 mL/min — ABNORMAL LOW (ref >60.00)
Glucose, Bld: 111 mg/dL — ABNORMAL HIGH (ref 70–99)
Potassium: 4.3 mEq/L (ref 3.5–5.1)
SODIUM: 139 meq/L (ref 135–145)

## 2013-08-29 LAB — HEPATIC FUNCTION PANEL
ALT: 24 U/L (ref 0–53)
AST: 21 U/L (ref 0–37)
Albumin: 3.7 g/dL (ref 3.5–5.2)
Alkaline Phosphatase: 70 U/L (ref 39–117)
Bilirubin, Direct: 0.1 mg/dL (ref 0.0–0.3)
Total Bilirubin: 0.6 mg/dL (ref 0.3–1.2)
Total Protein: 7.2 g/dL (ref 6.0–8.3)

## 2013-08-29 MED ORDER — HYDROCOD POLST-CHLORPHEN POLST 10-8 MG/5ML PO LQCR: 5.0000 mL | Freq: Two times a day (BID) | ORAL | Status: DC | PRN

## 2013-08-29 MED ORDER — MUPIROCIN 2 % EX OINT: 1.0000 "application " | TOPICAL_OINTMENT | Freq: Two times a day (BID) | CUTANEOUS | Status: AC

## 2013-08-29 NOTE — Patient Instructions (Signed)
Cough:   - Continue tessalon 200mg three times a day in addition to tussionex 5ml twice daily  ILD:   - [facility] not [facility]  - try to reach out to [facility] and [facility]  (I cannot find website for the [facility])  #chronic pain (chest and joints/knees) - please try Acupuncture  - [address], [place], [place] [address]   - [phone] #Creatinine  - check bmet  #Others   - refill mupirocon  Followup  -2 months  - spirometry at followup  

## 2013-08-29 NOTE — Progress Notes (Signed)
Subjective:    Patient ID: [name], male    DOB: 12-26-1980, 33 y.o.   MRN: [id number]  HPI  PCP Sanda Linger, MD Heme-ONc - [name] - Marchelle Gearing + [facility] (Was followed by Dr Maple Hudson through Sept 2014 and then switched to Dr Elly Modena) Renal:  DR Gwenith Spitz your nephrologist  HPI  # asthma, allergic rhinitis NOS  # recurrent respiratory infections NOS  #Venous Thrombo-Embolism- Recurrent DVT: Hematologist at [facility]  - ? First episode after colectomy in 2012  - Doppler- Had L popliteal vein DVT on 07/30/12  - 02/26/13  = POSITIVE DVT: acute deep vein thrombosis involving the left popliteal and distal femoral veins.  - Rx coumadin changed to Eliquis oct/Nov 2014 at [facility]  #Chronic Renal Insufficiency  - Creat 2.3mg % - Nov 2014  - GFR 44 per patient Jan 2015 at [facility] scan [facility]  #obesity  - Body mass index is 28.4 kg/(m^2). on 07/17/2013: this is a result of aggressie weight loss program since fall 2014  - on coumadin   #REcurrent bronchitis/respiratory tract infection    #Chronic Pain ad chronic benzo  - fentanyl patch  #Hermansky-Pudlak Syndrome  #Inflammatory bowel disease; Chrons' - s/p Total colectomy 2012   # albinism,     - on TNF alpha - [facility]   #Pulmonary fibrosis. - new since spring 2014   -  April 2014: FVC 4.30/74%, FEV1 3.45/77%, FEV1/FVC 0.80. TLC 92%, RV 121%, DLCO 40%.   -  CT 10/03/12 : IMPRESSION: Slightly upper lobe predominant peribronchovascular fibrosis, as  detailed above. This is new since 07/28/2008   - May 2014: 3L with exertion  - - Aug 2014: 6L with exertion. sTarted pirfenidone from [place], 200mg  x 4, TID \   - Nov 2014: Tussionex for cough   - [facility]ected [facility] kidney transplant at [facility]An 2015   -  PFT 05/29/13  FVC  4.16L/69% -> post BD 4.26L/71%  (jn aprl Dr Maple Hudson noted it was 4.3L, so this is same) TLC 5.36L/71% (in April it says 92% but I do not have actual number with  me) DLCO 14.3/39% (in April Dr Maple Hudson noted it ws 40%, so I would say same) moderate severity ILD in Dec 2014 PFT. I am not sure if is worse than April 2014 but possibly same.   [name] 07/01/2013  Chief Complaint  Patient presents with  . Follow-up    for ILD.  Pt is c/o having pain in chest. He has been cutting down on fentanyl, he is currently not on any.    Here with dad  Overall stable. Main issue is that he just found out an hour ago that [facility] rejected him as high risk because he woul need lung and kidney transplant. He is upset. He says he wants to die fighthing on an operating table. He wants my help to look at other transplant centers esp [facility] and [facility]. He says he has done all he can to get a transplant.   He is off fent patches and his joint pain is worse; wants referral to acupuncture  In terms of ILD: he is having supply problems from [place]. Wants me to write to [facility] to see if they will give him pirfenidone. Cough persists. Wants refills    OV 08/29/2013  Chief Complaint  Patient presents with  . Follow-up    for ILD.     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 After last visit, I  called [facility] Dr Zola ButtonHunninghake and due to poor renal function he is not a candidate. Suggested he try to to get in with [facility] multidisciplinary clinic first; He wil try [facility] [place] too. [place]. Advised not to overs stretch with too many transplant evals. [name]. He wants a letter from me summarizing his medical problems: given. He gave copies of summary of his medical problems. Wants bmet check. Wants tussionex refill. Now back on regular supply of PIRFENEX from [place]. Tolerating well. Doing rehab: says his [employer] fitness is current best ever in his adult life.   No results found for this basename: HGB, HCT, WBC, PLT,  in the last 168 hours   Recent Labs Lab 08/29/13 1522  NA 139  K 4.3  CL 110   CO2 23  GLUCOSE 111*  BUN 26*  CREATININE 1.8*  CALCIUM 9.0  Estimated Creatinine Clearance: 73.8 ml/min (by C-G formula based on Cr of 1.8).   Recent Labs Lab 08/29/13 1522  AST 21  ALT 24  ALKPHOS 70  BILITOT 0.6  PROT 7.2  ALBUMIN 3.7     Review of Systems  Constitutional: Negative for fever and unexpected weight change.  HENT: Negative for congestion, dental problem, ear pain, nosebleeds, postnasal drip, rhinorrhea, sinus pressure, sneezing, sore throat and trouble swallowing.   Eyes: Negative for redness and itching.  Respiratory: Negative for cough, chest tightness, shortness of breath and wheezing.   Cardiovascular: Negative for palpitations and leg swelling.  Gastrointestinal: Negative for nausea and vomiting.  Genitourinary: Negative for dysuria.  Musculoskeletal: Negative for joint swelling.  Skin: Negative for rash.  Neurological: Negative for headaches.  Hematological: Does not bruise/bleed easily.  Psychiatric/Behavioral: Negative for dysphoric mood. The patient is not nervous/anxious.        Objective:   Physical Exam  ursing note and vitals reviewed. Constitutional: He is oriented to person, place, and time. He appears well-developed and well-nourished. No distress.  Estimated body mass index is 29.56 kg/(m^2) as calculated from the following:   Height as of this encounter: 6\' 1"  (1.854 m).   Weight as of this encounter: 224 lb (101.606 kg).  HENT:  Head: Normocephalic and atraumatic.  Right Ear: External ear normal.  Left Ear: External ear normal.  Mouth/Throat: Oropharynx is clear and moist. No oropharyngeal exudate.  Poor vision O2 on  Eyes: Conjunctivae and EOM are normal. Pupils are equal, round, and reactive to light. Right eye exhibits no discharge. Left eye exhibits no discharge. No scleral icterus.  Neck: Normal range of motion. Neck supple. No JVD present. No tracheal deviation present. No thyromegaly present.  Cardiovascular: Normal  rate, regular rhythm and intact distal pulses.  Exam reveals no gallop and no friction rub.   No murmur heard. Pulmonary/Chest: Effort normal and breath sounds normal. No respiratory distress. He has no wheezes. He has no rales. He exhibits no tenderness.  Some crackles  Abdominal: Soft. Bowel sounds are normal. He exhibits no distension and no mass. There is no tenderness. There is no rebound and no guarding.  Musculoskeletal: Normal range of motion. He exhibits no edema and no tenderness.  Lymphadenopathy:    He has no cervical adenopathy.  Neurological: He is alert and oriented to person, place, and time. He has normal reflexes. No cranial nerve deficit. Coordination normal.  Skin: Skin is warm and  dry. No rash noted. He is not diaphoretic. No erythema. No pallor.  albino  Psychiatric: He has a normal mood and affect. His behavior is normal. Judgment and thought content normal.            Assessment & Plan:

## 2013-08-29 NOTE — Assessment & Plan Note (Signed)
Cough:   - Continue tessalon 200mg  three times a day in addition to tussionex 5ml twice daily  ILD:   - [facility] not [facility]  - try to reach out to [facility] and [facility]  (I cannot find website for the [facility])  #chronic pain (chest and joints/knees) - please try Acupuncture  - [address] [facility] [address], [place], [place] [address]   - [phone] #Creatinine  - check bmet  #Others   - refill mupirocon  Followup  -2 months  - spirometry at followup

## 2013-09-02 ENCOUNTER — Encounter (HOSPITAL_COMMUNITY): Payer: 59

## 2013-09-02 ENCOUNTER — Ambulatory Visit (INDEPENDENT_AMBULATORY_CARE_PROVIDER_SITE_OTHER): Payer: 59 | Admitting: Professional

## 2013-09-02 DIAGNOSIS — F4323 Adjustment disorder with mixed anxiety and depressed mood: Secondary | ICD-10-CM

## 2013-09-03 ENCOUNTER — Encounter (HOSPITAL_COMMUNITY): Payer: 59

## 2013-09-04 ENCOUNTER — Encounter (HOSPITAL_COMMUNITY)
Admission: RE | Admit: 2013-09-04 | Discharge: 2013-09-04 | Disposition: A | Discharge status: Home / Self Care | Payer: 59 | Source: Ambulatory Visit | Attending: Internal Medicine | Admitting: Internal Medicine

## 2013-09-09 ENCOUNTER — Encounter (HOSPITAL_COMMUNITY): Admission: RE | Admit: 2013-09-09 | Payer: 59 | Source: Ambulatory Visit

## 2013-09-10 ENCOUNTER — Encounter (HOSPITAL_COMMUNITY): Payer: 59

## 2013-09-10 ENCOUNTER — Telehealth: Payer: Self-pay | Admitting: Internal Medicine

## 2013-09-10 DIAGNOSIS — Z79899 Other long term (current) drug therapy: Secondary | ICD-10-CM | POA: Insufficient documentation

## 2013-09-10 DIAGNOSIS — Z86718 Personal history of other venous thrombosis and embolism: Secondary | ICD-10-CM | POA: Insufficient documentation

## 2013-09-10 DIAGNOSIS — Z5189 Encounter for other specified aftercare: Secondary | ICD-10-CM | POA: Insufficient documentation

## 2013-09-10 DIAGNOSIS — D649 Anemia, unspecified: Secondary | ICD-10-CM

## 2013-09-10 DIAGNOSIS — N189 Chronic kidney disease, unspecified: Secondary | ICD-10-CM

## 2013-09-10 DIAGNOSIS — J45909 Unspecified asthma, uncomplicated: Secondary | ICD-10-CM | POA: Insufficient documentation

## 2013-09-10 NOTE — Telephone Encounter (Signed)
Spoke w/ pt. He reports he thinks he may be anemic and wants to have lab work to check this. He has been feeling more tired than usually. He was taking iron before and have been off of it for a while. Please advise MR thanks  --pt aware MR not in the office until tomorrow.

## 2013-09-11 ENCOUNTER — Encounter (HOSPITAL_COMMUNITY)
Admission: RE | Admit: 2013-09-11 | Discharge: 2013-09-11 | Disposition: A | Discharge status: Home / Self Care | Payer: 59 | Source: Ambulatory Visit | Attending: Internal Medicine | Admitting: Internal Medicine

## 2013-09-11 NOTE — Telephone Encounter (Signed)
lmtcb x1 

## 2013-09-11 NOTE — Telephone Encounter (Signed)
Last cbc was nov 2014: so repeat cbc with diff now  Last creat 08/29/13 and improving but to be on safe side do repeat bmet now  Also, did he get in touch with [facility]?  Thanks  Dr. Kalman ShanMurali Donna Silverman, M.D., [facility].P Pulmonary and Critical Care Medicine Staff Physician [place] [facility] [place] [facility] and Critical [facility] Pager: [phone], If no answer or between  15:00h - 7:00h: call [phone]  09/11/2013 9:52 AM

## 2013-09-15 ENCOUNTER — Telehealth: Payer: Self-pay | Admitting: Internal Medicine

## 2013-09-15 MED ORDER — MOXIFLOXACIN HCL 400 MG PO TABS: 400.0000 mg | ORAL_TABLET | Freq: Every day | ORAL | Status: DC

## 2013-09-15 NOTE — Telephone Encounter (Signed)
Pt is aware of RA's recs. Rx has been sent in. Nothing further is needed. 

## 2013-09-15 NOTE — Telephone Encounter (Signed)
avelox 500 daily x 7ds

## 2013-09-15 NOTE — Telephone Encounter (Signed)
LMTCB x2  

## 2013-09-15 NOTE — Telephone Encounter (Signed)
Pt is calling back to check on status.  Please call.  Concerned he may miss returned call if calling back after 5:00 pm.  Antionette FairyHolly D Pryor

## 2013-09-15 NOTE — Telephone Encounter (Signed)
Spoke with the pt  He is c/o prod cough with yellow/green to blood tinged sputum x 1 wk  He states that he thinks blood is coming from his sinuses, since he has had some blood tinged nasal d/c as well  No fever or other co's  He states yesterday he took 1 dose of avelox and 1 dose of cefdinir, but that was all he had  Please advise thanks! Allergies  Allergen Reactions  . Avocado   . Carrot [Daucus Carota]   . Cherry   . Metronidazole     REACTION: neuropathy  . Watermelon [Citrullus Vulgaris]

## 2013-09-16 ENCOUNTER — Encounter (HOSPITAL_COMMUNITY): Payer: 59

## 2013-09-16 ENCOUNTER — Ambulatory Visit: Payer: 59 | Admitting: Professional

## 2013-09-16 NOTE — Telephone Encounter (Signed)
Spoke with pt and advised of Dr Jane Canaryamaswamy's recommenations.  Orders placed for cbcd and bmet.  Also pt states that he is still working on the transplant work up.  [facility]  Will forward this to MR for his FYI.

## 2013-09-16 NOTE — Telephone Encounter (Signed)
Thanks. Will await results.

## 2013-09-17 ENCOUNTER — Encounter (HOSPITAL_COMMUNITY): Payer: 59

## 2013-09-18 ENCOUNTER — Encounter (HOSPITAL_COMMUNITY)
Admission: RE | Admit: 2013-09-18 | Discharge: 2013-09-18 | Disposition: A | Discharge status: Home / Self Care | Payer: 59 | Source: Ambulatory Visit | Attending: Internal Medicine | Admitting: Internal Medicine

## 2013-09-22 ENCOUNTER — Telehealth: Payer: Self-pay | Admitting: Internal Medicine

## 2013-09-22 NOTE — Telephone Encounter (Signed)
Pt returned call.  Holly D Pryor ° °

## 2013-09-22 NOTE — Telephone Encounter (Signed)
Not ok to change antibiotics but if he insits can change to Take doxycycline 100mg  po twice daily x 5 days; take after meals and avoid sunlight   Headache: do not know. He should talk to pcp [name], MD. Not sure what is going on. Can you please have [name] primary address his headache?   THanks  Dr. [name], M.D., [facility].P Pulmonary and Critical Care Medicine Staff Physician [place] [facility] [place] Pulmonary and Critical Care Pager: [phone], If no answer or between  15:00h - 7:00h: call [phone]  09/22/2013 4:26 PM

## 2013-09-22 NOTE — Telephone Encounter (Signed)
Do 3 more days avelox but see TP tomorrow. Might need steroid burst

## 2013-09-22 NOTE — Telephone Encounter (Signed)
Called, spoke with pt.  Informed him of below recs per MR.  He verbalized understanding.  However, pt believes he needs something else other than avelox as he has already been on it x 7 days.  MR, pls advise.    Also, pt states he has an "extremely, pounding HA."  He has tried mucinex with increased fluids, another decongestant, tylenol, and ibuprofen with no relief.  Pt states he "can't stand to wait to tomorrow" to have this addressed.  States he can't sleep d/t this.  Requesting further recs on this.  MR, pls advise.  Thank you.

## 2013-09-22 NOTE — Telephone Encounter (Signed)
lmomtcb x1 for pt 

## 2013-09-22 NOTE — Telephone Encounter (Signed)
lmtcb x1 

## 2013-09-22 NOTE — Telephone Encounter (Signed)
Called, spoke with pt:  On 09/15/13, avelox qd x 7 days was sent in for pt.  Pt reports he responded really well to the avelox.  The acute symptoms completely resolved x 3-4 days.  He finished last avelox dose yesterday.  However, over the weekend, he noticed chest congestion returned with sinus pressure, a "really bad HA," and increased cough with yellow mucus.  No increased SOB, wheezing, chest tightness, or fever.  Reports HR is WNL.  Reports bloody mucus has also resolved.  We have scheduled pt to see TP tomorrow at 4:15.  However, pt is requesting to start on something today.  MR, pls advise.  Thank you.  [facility]  Allergies  Allergen Reactions  . Avocado   . Carrot [Daucus Carota]   . Cherry   . Metronidazole     REACTION: neuropathy  . Watermelon [Citrullus Vulgaris]

## 2013-09-23 ENCOUNTER — Ambulatory Visit (INDEPENDENT_AMBULATORY_CARE_PROVIDER_SITE_OTHER)
Admission: RE | Admit: 2013-09-23 | Discharge: 2013-09-23 | Disposition: A | Discharge status: Home / Self Care | Payer: 59 | Source: Ambulatory Visit | Attending: Adult Health | Admitting: Adult Health

## 2013-09-23 ENCOUNTER — Encounter: Payer: Self-pay | Admitting: Adult Health

## 2013-09-23 ENCOUNTER — Ambulatory Visit: Payer: 59 | Admitting: Professional

## 2013-09-23 ENCOUNTER — Encounter (HOSPITAL_COMMUNITY): Payer: 59

## 2013-09-23 ENCOUNTER — Ambulatory Visit (INDEPENDENT_AMBULATORY_CARE_PROVIDER_SITE_OTHER): Payer: 59 | Admitting: Adult Health

## 2013-09-23 VITALS — BP 124/92 | HR 108 | Temp 98.2°F | Ht 73.0 in | Wt 224.2 lb

## 2013-09-23 DIAGNOSIS — J849 Interstitial pulmonary disease, unspecified: Secondary | ICD-10-CM

## 2013-09-23 DIAGNOSIS — J209 Acute bronchitis, unspecified: Secondary | ICD-10-CM

## 2013-09-23 DIAGNOSIS — J8410 Pulmonary fibrosis, unspecified: Secondary | ICD-10-CM

## 2013-09-23 MED ORDER — DOXYCYCLINE HYCLATE 100 MG PO TABS: 100.0000 mg | ORAL_TABLET | Freq: Two times a day (BID) | ORAL | Status: DC

## 2013-09-23 NOTE — Assessment & Plan Note (Signed)
Slow to resolve flare in setting of ILD and immunosuppressed pt.  Check cxr today  Check sputum cx   Plan  Doxcycline 100mg  Twice daily  For 7 days  Mucinex DM Twice daily  As needed  Cough/congestion  Fluids and rest  Saline nasal rinses As needed   Please contact office for sooner follow up if symptoms do not improve or worsen or seek emergency care  Chest xray today .

## 2013-09-23 NOTE — Progress Notes (Signed)
Subjective:    Patient ID: [name], male    DOB: [birth year], 33 y.o.   MRN: [id number]  HPI  PCP [name], MD Heme-ONc - [name] - [name] (Was followed by Dr [name] through Sept 2014 and then switched to Dr Elly Modenaamswamy) Renal:  DR [name] your nephrologist  HPI  # asthma, allergic rhinitis NOS  # recurrent respiratory infections NOS  #Venous Thrombo-Embolism- [facility]urrent [facility]  - ? First episode after colectomy in 2012  - Doppler- Had L popliteal vein DVT on 07/30/12  - 02/26/13  = POSITIVE DVT: acute deep vein thrombosis involving the left popliteal and distal femoral veins.  - [facility]  #Chronic Renal Insufficiency  - Creat 2.3mg % - Nov 2014  - [facility]  #obesity  - Body mass index is 28.4 kg/(m^2). on 07/17/2013: this is a result of aggressie weight loss program since fall 2014  - on coumadin   #REcurrent bronchitis/respiratory tract infection    #Chronic Pain ad chronic benzo  - fentanyl patch  #Hermansky-Pudlak Syndrome  #Inflammatory bowel disease; Chrons' - s/p Total colectomy 2012   # albinism,     - on TNF alpha - [facility]   #Pulmonary fibrosis. - new since spring 2014   -  April 2014: FVC 4.30/74%, FEV1 3.45/77%, FEV1/FVC 0.80. TLC 92%, RV 121%, DLCO 40%.   -  CT 10/03/12 : IMPRESSION: Slightly upper lobe predominant peribronchovascular fibrosis, as  detailed above. This is new since 07/28/2008   - May 2014: 3L with exertion  - - Aug 2014: 6L with exertion. sTarted pirfenidone from [place], 200mg  x 4, TID \   - Nov 2014: Tussionex for cough   - [facility]   -  PFT 05/29/13  FVC  4.16L/69% -> post BD 4.26L/71%  (jn aprl Dr [name] noted it was 4.3L, so this is same) TLC 5.36L/71% (in April it says 92% but I do not have actual number with  me) DLCO 14.3/39% (in April Dr [name] noted it ws 40%, so I would say same) moderate severity ILD in Dec 2014 PFT. I am not sure if is worse than April 2014 but possibly same.   [name].   OV 07/01/2013  Chief Complaint  Patient presents with  . Follow-up    for ILD.  Pt is c/o having pain in chest. He has been cutting down on fentanyl, he is currently not on any.    Here with dad  Overall stable. Main issue is that he just found out an hour ago [facility] because he woul need lung and kidney transplant. He is upset. He says he wants to die fighthing on an operating table. He wants my help to look at other transplant centers esp [facility] and [facility]. He says he has done all he can to get a transplant.   He is off fent patches and his joint pain is worse; wants referral to acupuncture  In terms of ILD: he is having supply problems from [place]. [facility] him pirfenidone. Cough persists. Wants refills    OV 08/29/2013  Chief Complaint  Patient presents with  . Follow-up    for ILD.   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 After last visit, I  called [facility] Dr Zola ButtonHunninghake and due to poor renal function he is not a candidate. Suggested he try to to get in with [facility] first; He wil try [facility] too. [place]. Advised not to overs stretch with too many transplant evals. [name]. He wants a letter from me summarizing his medical problems: given. He gave copies of summary of his medical problems. Wants bmet check. Wants tussionex refill. Now back on regular supply of PIRFENEX from [place]. Tolerating well. Doing rehab: says his [facility] fitness is current best ever in his adult life.   No results found for this basename: HGB, HCT, WBC, PLT,  in the last 168 hours   Recent Labs Lab 08/29/13 1522  NA 139  K 4.3  CL 110   CO2 23  GLUCOSE 111*  BUN 26*  CREATININE 1.8*  CALCIUM 9.0  Estimated Creatinine Clearance: 73.8 ml/min (by C-G formula based on Cr of 1.8).   Recent Labs Lab 08/29/13 1522  AST 21  ALT 24  ALKPHOS 70  BILITOT 0.6  PROT 7.2  ALBUMIN 3.7   09/23/2013 Acute OV  MR pt here for increased sinus pressure w/ minimal drainage, PND, prod cough with minimal yellow/green mucus, fever yesterday/today up to 101, N/D.  taking Mucinex, Sudafed, increased fluids/rest.     Review of Systems  Constitutional:   No  weight loss, night sweats,   +Fevers, chills,  +fatigue, or  lassitude.  HEENT:   No headaches,  Difficulty swallowing,  Tooth/dental problems, or  Sore throat,                No sneezing, itching, ear ache, + nasal congestion, post nasal drip,   CV:  No chest pain,  Orthopnea, PND, swelling in lower extremities, anasarca, dizziness, palpitations, syncope.   GI  No heartburn, indigestion, abdominal pain, nausea, vomiting, diarrhea, change in bowel habits, loss of appetite, bloody stools.   Resp:   No chest wall deformity  Skin: no rash or lesions.  GU: no dysuria, change in color of urine, no urgency or frequency.  No flank pain, no hematuria   MS:  No joint pain or swelling.  No decreased range of motion.  No back pain.  Psych:  No change in mood or affect. No depression or anxiety.  No memory loss.          Objective:   Physical Exam GEN: A/Ox3; pleasant , NAD  HEENT:  [place]/AT,  EACs-clear, TMs-wnl, NOSE-clear mild redness  w/ clear drainage , THROAT-clear, no lesions, no postnasal drip or exudate noted.   NECK:  Supple w/ fair ROM; no JVD; normal carotid impulses w/o bruits; no thyromegaly or nodules palpated; no lymphadenopathy.  RESP  Bibasilar crackles w/o, wheezes/ o accessory muscle use, no dullness to percussion  CARD:  RRR, no m/r/g  , no peripheral edema, pulses intact, no cyanosis or clubbing.  GI:   Soft & nt; nml bowel sounds; no organomegaly or  masses detected.ileostomy   Musco: Warm bil, no deformities or joint swelling noted.   Neuro: alert, no focal deficits noted.    Skin: Warm, no lesions or rashes, albino           Assessment & Plan:

## 2013-09-23 NOTE — Patient Instructions (Signed)
Doxcycline 100mg  Twice daily  For 7 days  Mucinex DM Twice daily  As needed  Cough/congestion  Fluids and rest  Saline nasal rinses As needed   Please contact office for sooner follow up if symptoms do not improve or worsen or seek emergency care  Chest xray today .

## 2013-09-23 NOTE — Telephone Encounter (Signed)
LMTCbx2 for pt. Jeremy CurieJennifer Nino Amano, CMA

## 2013-09-24 ENCOUNTER — Encounter (HOSPITAL_COMMUNITY): Payer: 59

## 2013-09-24 NOTE — Telephone Encounter (Signed)
Spoke w/ pt. He came in and saw NP

## 2013-09-25 ENCOUNTER — Encounter (HOSPITAL_COMMUNITY): Payer: 59

## 2013-09-26 ENCOUNTER — Encounter: Payer: Self-pay | Admitting: Internal Medicine

## 2013-09-26 ENCOUNTER — Ambulatory Visit (INDEPENDENT_AMBULATORY_CARE_PROVIDER_SITE_OTHER): Payer: 59 | Admitting: Internal Medicine

## 2013-09-26 ENCOUNTER — Other Ambulatory Visit (INDEPENDENT_AMBULATORY_CARE_PROVIDER_SITE_OTHER): Payer: 59

## 2013-09-26 ENCOUNTER — Telehealth: Payer: Self-pay | Admitting: Internal Medicine

## 2013-09-26 VITALS — BP 130/72 | HR 124 | Ht 73.0 in | Wt 216.8 lb

## 2013-09-26 DIAGNOSIS — J849 Interstitial pulmonary disease, unspecified: Secondary | ICD-10-CM

## 2013-09-26 DIAGNOSIS — J8410 Pulmonary fibrosis, unspecified: Secondary | ICD-10-CM

## 2013-09-26 LAB — BASIC METABOLIC PANEL
BUN: 25 mg/dL — AB (ref 6–23)
CALCIUM: 8.7 mg/dL (ref 8.4–10.5)
CO2: 20 mEq/L (ref 19–32)
CREATININE: 2.1 mg/dL — AB (ref 0.4–1.5)
Chloride: 108 mEq/L (ref 96–112)
GFR: 38.22 mL/min — AB (ref >60.00)
Glucose, Bld: 96 mg/dL (ref 70–99)
Potassium: 3.7 mEq/L (ref 3.5–5.1)
Sodium: 137 mEq/L (ref 135–145)

## 2013-09-26 LAB — CBC WITH DIFFERENTIAL/PLATELET
BASOS PCT: 0.4 % (ref 0.0–3.0)
Basophils Absolute: 0 10*3/uL (ref 0.0–0.1)
EOS ABS: 0 10*3/uL (ref 0.0–0.7)
Eosinophils Relative: 0.4 % (ref 0.0–5.0)
HCT: 43.8 % (ref 39.0–52.0)
HEMOGLOBIN: 14.6 g/dL (ref 13.0–17.0)
Lymphocytes Relative: 33.6 % (ref 12.0–46.0)
Lymphs Abs: 1 10*3/uL (ref 0.7–4.0)
MCHC: 33.4 g/dL (ref 30.0–36.0)
MCV: 91.8 fl (ref 78.0–100.0)
MONO ABS: 0.4 10*3/uL (ref 0.1–1.0)
Monocytes Relative: 13.4 % — ABNORMAL HIGH (ref 3.0–12.0)
NEUTROS ABS: 1.6 10*3/uL (ref 1.4–7.7)
NEUTROS PCT: 52.2 % (ref 43.0–77.0)
Platelets: 156 10*3/uL (ref 150.0–400.0)
RBC: 4.77 Mil/uL (ref 4.22–5.81)
RDW: 16.3 % — ABNORMAL HIGH (ref 11.5–14.6)
WBC: 3 10*3/uL — ABNORMAL LOW (ref 4.5–10.5)

## 2013-09-26 LAB — PHOSPHORUS: PHOSPHORUS: 3.2 mg/dL (ref 2.3–4.6)

## 2013-09-26 LAB — HEPATIC FUNCTION PANEL
ALT: 71 U/L — ABNORMAL HIGH (ref 0–53)
AST: 46 U/L — ABNORMAL HIGH (ref 0–37)
Albumin: 3.5 g/dL (ref 3.5–5.2)
Alkaline Phosphatase: 70 U/L (ref 39–117)
Bilirubin, Direct: 0.1 mg/dL (ref 0.0–0.3)
Total Bilirubin: 0.6 mg/dL (ref 0.3–1.2)
Total Protein: 7.6 g/dL (ref 6.0–8.3)

## 2013-09-26 LAB — MAGNESIUM: MAGNESIUM: 1.8 mg/dL (ref 1.5–2.5)

## 2013-09-26 MED ORDER — PREDNISONE 10 MG PO TABS
ORAL_TABLET | ORAL | Status: DC
Start: 2013-09-26 — End: 2013-10-03

## 2013-09-26 NOTE — Telephone Encounter (Signed)
Pt aware of results and recs.  I have placed future orders for these labs needed.  Nothing further needed at this time.

## 2013-09-26 NOTE — Telephone Encounter (Signed)
  Recent Labs Lab 09/26/13 1518  NA 137  K 3.7  CL 108  CO2 20  GLUCOSE 96  BUN 25*  CREATININE 2.1*  CALCIUM 8.7  MG 1.8  PHOS 3.2    Recent Labs Lab 09/26/13 1518  HGB 14.6  HCT 43.8  WBC 3.0*  PLT 156.0    Recent Labs Lab 09/26/13 1518  AST 46*  ALT 71*  ALKPHOS 70  BILITOT 0.6  PROT 7.6  ALBUMIN 3.5     Labs done today for acute visit  A) his creatinine high but stable  B) Liver enzymes bumping up - first time, Will keep an eye especially because he is in pirfenidone.   C) WBC low  At 3K  He should watch himself closely. If not better, go to ER or even call on call doc [phone] over weekend   He needs to do repeat cbc, bmet, lft in 1 week   Dr. Kalman ShanMurali Mitesh Rosendahl, M.D., [facility].P Pulmonary and Critical Care Medicine Staff Physician [facility] [place] Pulmonary and Critical Care Pager: [phone], If no answer or between  15:00h - 7:00h: call [phone]  09/26/2013 5:30 PM

## 2013-09-26 NOTE — Telephone Encounter (Signed)
(  cont'd from above)  Pt states it's ok to to LM.  Pt just wants to know that this is really @ pharmacy & ready for p/u.  Thanks!  Antionette FairyHolly D Pryor

## 2013-09-26 NOTE — Telephone Encounter (Signed)
Spoke with pharmacist, verified that the prednisone rx was received.  Pt aware of this.  Nothing further needed.

## 2013-09-26 NOTE — Progress Notes (Signed)
Subjective:    Patient ID: [name], male    DOB: Jun 25, 1980, 33 y.o.   MRN: [id number]  HPI  PCP [name], MD Heme-ONc - [name] (Was followed by Dr [name] through Sept 2014 and then switched to Dr Elly Modenaamswamy) Renal:  DR [name] your nephrologist  HPI  # asthma, allergic rhinitis NOS  # recurrent respiratory infections NOS  #Venous Thrombo-Embolism- [facility]  - ? First episode after colectomy in 2012  - Doppler- Had L popliteal vein DVT on 07/30/12  - 02/26/13  = POSITIVE DVT: acute deep vein thrombosis involving the left popliteal and distal femoral veins.  - [facility]  #Chronic Renal Insufficiency  - Creat 2.3mg % - Nov 2014  - [facility] 44 [facility]  #obesity  - Body mass index is 28.4 kg/(m^2). on 07/17/2013: this is a result of aggressie weight loss program since fall 2014  - on coumadin   #REcurrent bronchitis/respiratory tract infection    #Chronic Pain ad chronic benzo  - fentanyl patch  #Hermansky-Pudlak Syndrome  #Inflammatory bowel disease; Chrons' - s/p Total colectomy 2012   # albinism,     - on TNF alpha - [facility]   #Pulmonary fibrosis. - new since spring 2014   -  April 2014: FVC 4.30/74%, FEV1 3.45/77%, FEV1/FVC 0.80. TLC 92%, RV 121%, DLCO 40%.   -  CT 10/03/12 : IMPRESSION: Slightly upper lobe predominant peribronchovascular fibrosis, as  detailed above. This is new since 07/28/2008   - May 2014: 3L with exertion  - - Aug 2014: 6L with exertion. sTarted pirfenidone from [place], 200mg  x 4, TID \   - Nov 2014: Tussionex for cough   - [facility] at [facility]   -  PFT 05/29/13  FVC  4.16L/69% -> post BD 4.26L/71%  (jn aprl Dr [name] noted it was 4.3L, so this is same) TLC 5.36L/71% (in April it says 92% but I do not have actual number with  me) DLCO 14.3/39% (in April Dr [name] noted it ws 40%, so I would say same) moderate severity ILD in Dec 2014 PFT. I am not sure if is worse than April 2014 but possibly same.   [name].   OV 07/01/2013  Chief Complaint  Patient presents with  . Follow-up    for ILD.  Pt is c/o having pain in chest. He has been cutting down on fentanyl, he is currently not on any.    Here with dad  Overall stable. Main issue is that he just found out an hour ago [name] because he woul need lung and kidney transplant. He is upset. He says he wants to die fighthing on an operating table. He wants my help to look at other transplant centers esp [name]. He says he has done all he can to get a transplant.   He is off fent patches and his joint pain is worse; wants referral to acupuncture  In terms of ILD: he is having supply problems from [place]. W[facility] to [name] to see if they will give him pirfenidone. Cough persists. Wants refills    OV 08/29/2013  Chief Complaint  Patient presents with  . Follow-up    for ILD.   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 After last visit, I  called [facility] Dr Zola ButtonHunninghake and due to poor renal function he is not a candidate. Suggested he try to to get in with [facility] first; He wil try [facility] too. [place]. Advised not to overs stretch with too many transplant evals. [name]. He wants a letter from me summarizing his medical problems: given. He gave copies of summary of his medical problems. Wants bmet check. Wants tussionex refill. Now back on regular supply of PIRFENEX from [place]. Tolerating well. Doing rehab: says his [facility] fitness is current best ever in his adult life.   No results found for this basename: HGB, HCT, WBC, PLT,  in the last 168 hours    09/23/2013 Acute OV  MR pt here for increased sinus  pressure w/ minimal drainage, PND, prod cough with minimal yellow/green mucus, fever yesterday/today up to 101, N/D.  taking Mucinex, Sudafed, increased fluids/rest.   Doxcycline 100mg  Twice daily  For 7 days  Mucinex DM Twice daily  As needed  Cough/congestion  Fluids and rest  Saline nasal rinses As needed   Please contact office for sooner follow up if symptoms do not improve or worsen or seek emergency care  Chest xray today .    OV 09/26/2013  Chief Complaint  Patient presents with  . Acute Visit    c/o Increased SOB with any exertion, chest congestion, mostly nonprod cough with some clear mucous X2 days.     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 His entire family was sick. They all have recovered but he has not. He is completed 10 days of Avelox. Is currently on day 3 out of day 7 of doxycycline. Since seeing a nurse practitioner on 09/23/2013 he then has new shortness of breath. He is compliant with his anticoagulations her prior pulmonary embolism. Is extremely fatigued. He also has persistent cough. He has lost 12 pounds of weight through all this illness. His urine output is adequate. He is using 6 L of oxygen which is his baseline but he feels that instead of mid 90s he is at low 90s.  Walk test 185 feet x 3 laps on 6L [place]: Walk only 2 laps. Resting heart rate was 118 per minute. Resting pulse ox was 100%. At 2 laps he got extremely fatigued and stopped. His peak heart rate rose up 142 per minute while the saturation stayed at 99%.  Also orthostatic vital signs suggest that he might be mildly dehydrated. Systolic blood pressure 126 laying down. Heart rate of 93 per minute laying down. Standing systolic blood pressure went down to 118 and heart rate rose up to 118 per minute  Review of Systems  Constitutional: Negative for fever and unexpected weight change.  HENT: Positive  for congestion, postnasal drip, rhinorrhea and sinus pressure. Negative for dental problem, ear pain, nosebleeds, sneezing, sore throat and trouble swallowing.   Eyes: Negative for redness and itching.  Respiratory: Positive for cough and shortness of breath. Negative for chest tightness and wheezing.   Cardiovascular: Negative for palpitations and leg swelling.  Gastrointestinal: Negative for nausea and vomiting.  Genitourinary: Negative for dysuria.  Musculoskeletal: Negative for joint swelling.  Skin: Negative for rash.  Neurological: Negative for headaches.  Hematological: Does not bruise/bleed easily.  Psychiatric/Behavioral: Negative  for dysphoric mood. The patient is not nervous/anxious.        Objective:   Physical Exam  Physical Exam GEN: A/Ox3; pleasant , NAD/ HAS LOST WEIGHT  HEENT:  [place]/AT,  EACs-clear, TMs-wnl, NOSE-clear mild redness  w/ clear drainage , THROAT-clear, no lesions, no postnasal drip or exudate noted.   NECK:  Supple w/ fair ROM; no JVD; normal carotid impulses w/o bruits; no thyromegaly or nodules palpated; no lymphadenopathy.  RESP  NO  crackles w/o, wheezes/ o accessory muscle use, no dullness to percussion  CARD:  RRR, no m/r/g  , no peripheral edema, pulses intact, no cyanosis or clubbing.  GI:   Soft & nt; nml bowel sounds; no organomegaly or masses detected.ileostomy   Musco: Warm bil, no deformities or joint swelling noted.   Neuro: alert, no focal deficits noted.    Skin: Warm, no lesions or rashes, albino          Assessment & Plan:

## 2013-09-26 NOTE — Patient Instructions (Addendum)
Likely dehydration and post viral fatigue along with mild flare up of ILD Drink fluids like G2 Gatorade and take it easy Check BMET, Mag, phos, LFT, CBC Start prednisone 40 mg daily x 2 days, then 20mg  daily x 2 days, then 10mg  daily x 2 days, then 5mg  daily x 2 days and stop  Followup  1-2 weeks with me or NP

## 2013-09-28 NOTE — Assessment & Plan Note (Addendum)
Likely dehydration and post viral fatigue along with mild flare up of ILD Drink fluids like G2 Gatorade and take it easy Check BMET, Mag, phos, LFT, CBC Start prednisone 40 mg daily x 2 days, then 20mg  daily x 2 days, then 10mg  daily x 2 days, then 5mg  daily x 2 days and stop  Followup  1-2 weeks with me or NP   > 50% of this > 25 min visit spent in face to face counseling (15 min visit converted to 25 min)

## 2013-09-30 ENCOUNTER — Encounter (HOSPITAL_COMMUNITY): Payer: 59

## 2013-09-30 ENCOUNTER — Ambulatory Visit (INDEPENDENT_AMBULATORY_CARE_PROVIDER_SITE_OTHER): Payer: 59 | Admitting: Professional

## 2013-09-30 DIAGNOSIS — F4323 Adjustment disorder with mixed anxiety and depressed mood: Secondary | ICD-10-CM

## 2013-10-01 ENCOUNTER — Encounter (HOSPITAL_COMMUNITY): Payer: 59

## 2013-10-02 ENCOUNTER — Encounter (HOSPITAL_COMMUNITY)
Admission: RE | Admit: 2013-10-02 | Discharge: 2013-10-02 | Disposition: A | Discharge status: Home / Self Care | Payer: 59 | Source: Ambulatory Visit | Attending: Internal Medicine | Admitting: Internal Medicine

## 2013-10-03 ENCOUNTER — Encounter: Payer: Self-pay | Admitting: Adult Health

## 2013-10-03 ENCOUNTER — Ambulatory Visit (INDEPENDENT_AMBULATORY_CARE_PROVIDER_SITE_OTHER): Payer: 59 | Admitting: Adult Health

## 2013-10-03 VITALS — BP 110/84 | HR 84 | Temp 98.5°F | Ht 73.0 in | Wt 220.4 lb

## 2013-10-03 DIAGNOSIS — J209 Acute bronchitis, unspecified: Secondary | ICD-10-CM

## 2013-10-03 MED ORDER — HYDROCOD POLST-CHLORPHEN POLST 10-8 MG/5ML PO LQCR
5.0000 mL | Freq: Two times a day (BID) | ORAL | Status: DC | PRN
Start: 2013-10-11 — End: 2013-11-12

## 2013-10-03 NOTE — Patient Instructions (Signed)
Continue on current regimen  Follow up Dr. [name] 4 weeks and As needed   Please contact office for sooner follow up if symptoms do not improve or worsen or seek emergency care

## 2013-10-06 ENCOUNTER — Encounter: Payer: Self-pay | Admitting: Internal Medicine

## 2013-10-06 ENCOUNTER — Other Ambulatory Visit: Payer: Self-pay | Admitting: Internal Medicine

## 2013-10-07 ENCOUNTER — Ambulatory Visit (INDEPENDENT_AMBULATORY_CARE_PROVIDER_SITE_OTHER): Payer: 59 | Admitting: Professional

## 2013-10-07 ENCOUNTER — Telehealth: Payer: Self-pay | Admitting: Internal Medicine

## 2013-10-07 DIAGNOSIS — F4323 Adjustment disorder with mixed anxiety and depressed mood: Secondary | ICD-10-CM

## 2013-10-07 NOTE — Telephone Encounter (Signed)
Will cancel message. Victorino DikeJennifer came out and spoke to pt.

## 2013-10-08 ENCOUNTER — Encounter: Payer: Self-pay | Admitting: Internal Medicine

## 2013-10-08 ENCOUNTER — Encounter (HOSPITAL_COMMUNITY): Payer: 59

## 2013-10-08 ENCOUNTER — Ambulatory Visit (INDEPENDENT_AMBULATORY_CARE_PROVIDER_SITE_OTHER): Payer: 59 | Admitting: Internal Medicine

## 2013-10-08 VITALS — BP 132/90 | HR 98 | Temp 98.1°F | Resp 20 | Ht 73.0 in | Wt 222.8 lb

## 2013-10-08 DIAGNOSIS — N189 Chronic kidney disease, unspecified: Secondary | ICD-10-CM

## 2013-10-08 DIAGNOSIS — R000 Tachycardia, unspecified: Secondary | ICD-10-CM

## 2013-10-08 NOTE — Progress Notes (Signed)
Subjective:    Patient ID: Asa [name] A Reh, male    DOB: Jun 25, 1980, 33 y.o.   MRN: [id number]  HPI  PCP [name], MD Heme-ONc - [name] - [name] + [facility] (Was followed by Dr [name] through Sept 2014 and then switched to Dr Elly Modenaamswamy) Renal:  DR [name] your nephrologist  HPI  # asthma, allergic rhinitis NOS  # recurrent respiratory infections NOS  #Venous Thrombo-Embolism- Recurrent DVT: [facility]  - ? First episode after colectomy in 2012  - Doppler- Had L popliteal vein DVT on 07/30/12  - 02/26/13  = POSITIVE DVT: acute deep vein thrombosis involving the left popliteal and distal femoral veins.  - [facility]  #Chronic Renal Insufficiency  - Creat 2.3mg % - Nov 2014  - GFR 44 [facility] study  #obesity  - Body mass index is 28.4 kg/(m^2). on 07/17/2013: this is a result of aggressie weight loss program since fall 2014  - on coumadin   #REcurrent bronchitis/respiratory tract infection    #Chronic Pain ad chronic benzo  - fentanyl patch  #Hermansky-Pudlak Syndrome  #Inflammatory bowel disease; Chrons' - s/p Total colectomy 2012   # albinism,     - on TNF alpha - [facility]   #Pulmonary fibrosis. - new since spring 2014   -  April 2014: FVC 4.30/74%, FEV1 3.45/77%, FEV1/FVC 0.80. TLC 92%, RV 121%, DLCO 40%.   -  CT 10/03/12 : IMPRESSION: Slightly upper lobe predominant peribronchovascular fibrosis, as  detailed above. This is new since 07/28/2008   - May 2014: 3L with exertion  - - Aug 2014: 6L with exertion. sTarted pirfenidone from [place], 200mg  x 4, TID \   - Nov 2014: Tussionex for cough   - [facility]jenected for [facility] kidney transplant at [facility] JAn 2015   -  PFT 05/29/13  FVC  4.16L/69% -> post BD 4.26L/71%  (jn aprl Dr [name] noted it was 4.3L, so this is same) TLC 5.36L/71% (in April it says 92% but I do not have actual number with  me) DLCO 14.3/39% (in April Dr [name] noted it ws 40%, so I would say same) moderate severity ILD in Dec 2014 PFT. I am not sure if is worse than April 2014 but possibly same.   [name].   OV 07/01/2013  Chief Complaint  Patient presents with  . Follow-up    for ILD.  Pt is c/o having pain in chest. He has been cutting down on fentanyl, he is currently not on any.    Here with dad  Overall stable. Main issue is that he just found [facility] an hour ago [name] because he woul need lung and kidney transplant. He is upset. He says he wants to die fighthing on an operating table. He wants my help to look at other transplant centers esp [name]. He says he has done all he can to get a transplant.   He is off fent patches and his joint pain is worse; wants referral to acupuncture  In terms of ILD: he is having supply problems from [place]. [name] if [facility] will give him pirfenidone. Cough persists. Wants refills    OV 08/29/2013  Chief Complaint  Patient presents with  . Follow-up    for ILD.   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 After last visit, I  called [facility] Dr Zola ButtonHunninghake and due to poor renal function he is not a candidate. Suggested he try to to get in [facility] first; He wil try [place] too. [place]. Advised not to overs stretch with too many transplant evals. [name]. He wants a letter from me summarizing his medical problems: given. He gave copies of summary of his medical problems. Wants bmet check. Wants tussionex refill. Now back on regular supply of PIRFENEX from [place]. Tolerating well. Doing rehab: says his [facility] is current best ever in his adult life.   No results found for this basename: HGB, HCT, WBC, PLT,  in the last 168 hours    09/23/2013 Acute OV  MR pt here for increased sinus  pressure w/ minimal drainage, PND, prod cough with minimal yellow/green mucus, fever yesterday/today up to 101, N/D.  taking Mucinex, Sudafed, increased fluids/rest.  >tx w/ Doxcycline 100mg  Twice daily  For 7 days  Mucinex DM Twice daily  As needed  Cough/congestion  Fluids and rest , Saline nasal rinses As needed   CXR w/ chronic changes    OV 09/26/2013  Chief Complaint  Patient presents with  . Acute Visit    c/o Increased SOB with any exertion, chest congestion, mostly nonprod cough with some clear mucous X2 days.     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 His entire family was sick. They all have recovered but he has not. He is completed 10 days of Avelox. Is currently on day 3 out of day 7 of doxycycline. Since seeing a nurse practitioner on 09/23/2013 he then has new shortness of breath. He is compliant with his anticoagulations her prior pulmonary embolism. Is extremely fatigued. He also has persistent cough. He has lost 12 pounds of weight through all this illness. His urine output is adequate. He is using 6 L of oxygen which is his baseline but he feels that instead of mid 90s he is at low 90s.  Walk test 185 feet x 3 laps on 6L [place]: Walk only 2 laps. Resting heart rate was 118 per minute. Resting pulse ox was 100%. At 2 laps he got extremely fatigued and stopped. His peak heart rate rose up 142 per minute while the saturation stayed at 99%.  Also orthostatic vital signs suggest that he might be mildly dehydrated. Systolic blood pressure 126 laying down. Heart rate of 93 per minute laying down. Standing systolic blood pressure went down to 118 and heart rate rose up to 118 per minute >>pred taper , labs   10/03/13 Follow up  Pt returns for follow up for recent slow to resolve bronchitic flare . Tx w/ doxycycline and steroid taper.  CXR w/ no acute process, chronic changes.   Labs showed chronic changes with renal insuffciency. LFT w/ slight bump , WBC 3K .   Pt states breathing has improved. Pt states he has still had a productive cough with clear mucous. Denies CP. Denies SOB when wearing O2.  No hemoptysis, chest pain, hemoptysis, fever or n/v/d. Eating well.    Review of Systems  Constitutional: Negative for fever and unexpected weight change.  HENT: Positive for congestion,  Negative for dental problem, ear pain, nosebleeds, sneezing, sore throat and trouble swallowing.  Eyes: Negative for redness and itching.  Respiratory: Positive for  shortness of breath. Negative for chest tightness and wheezing.   Cardiovascular: Negative for palpitations and leg swelling.  Gastrointestinal: Negative for nausea and vomiting.  Genitourinary: Negative for dysuria.  Musculoskeletal: Negative for joint swelling.  Skin: Negative for rash.  Neurological: Negative for headaches.  Hematological: Does not bruise/bleed easily.  Psychiatric/Behavioral: Negative for dysphoric mood. The patient is not nervous/anxious.        Objective:   Physical Exam  Physical Exam GEN: A/Ox3; pleasant , NAD  HEENT:  [place]/AT,  EACs-clear, TMs-wnl, NOSE-clear mild redness  w/ clear drainage , THROAT-clear, no lesions, no postnasal drip or exudate noted.   NECK:  Supple w/ fair ROM; no JVD; normal carotid impulses w/o bruits; no thyromegaly or nodules palpated; no lymphadenopathy.  RESP  NO  crackles w/o, wheezes/ o accessory muscle use, no dullness to percussion  CARD:  RRR, no m/r/g  , no peripheral edema, pulses intact, no cyanosis or clubbing.  GI:   Soft & nt; nml bowel sounds; no organomegaly or masses detected.ileostomy   Musco: Warm bil, no deformities or joint swelling noted.   Neuro: alert, no focal deficits noted.    Skin: Warm, no lesions or rashes, albino          Assessment & Plan:

## 2013-10-08 NOTE — Assessment & Plan Note (Signed)
Resolving flare   Plan  Continue on current regimen  Follow up Dr. [name] 4 weeks and As needed   Please contact office for sooner follow up if symptoms do not improve or worsen or seek emergency care

## 2013-10-08 NOTE — Progress Notes (Signed)
Pre visit review using our clinic review tool, if applicable. No additional management support is needed unless otherwise documented below in the visit note. 

## 2013-10-08 NOTE — Progress Notes (Signed)
   Subjective:    Patient ID: [name], male    DOB: Feb 15, 1981, 33 y.o.   MRN: [id number]  HPI Comments: He returns today just to check in. He needs a form completed so that [place] cab [place] [facility] [place] [facility] [place] for [place] lung transplant. He feels at his baseline today with no new complaints.     Review of Systems  Constitutional: Positive for fatigue. Negative for fever, chills, diaphoresis and appetite change.  HENT: Negative.   Eyes: Negative.   Respiratory: Positive for shortness of breath. Negative for cough, choking, chest tightness and stridor.   Cardiovascular: Negative.  Negative for chest pain, palpitations and leg swelling.  Gastrointestinal: Negative for nausea, vomiting, abdominal pain, diarrhea and constipation.  Endocrine: Negative.   Genitourinary: Negative.   Musculoskeletal: Positive for arthralgias. Negative for back pain, gait problem, joint swelling, myalgias, neck pain and neck stiffness.  Skin: Negative.   Allergic/Immunologic: Negative.   Neurological: Negative.  Negative for dizziness, tremors, speech difficulty, weakness, light-headedness, numbness and headaches.  Hematological: Negative.  Negative for adenopathy. Does not bruise/bleed easily.  Psychiatric/Behavioral: Negative.        Objective:   Physical Exam  Vitals reviewed. Constitutional: He is oriented to person, place, and time. He appears well-developed and well-nourished.  Non-toxic appearance. He does not have a sickly appearance. He does not appear ill. No distress.  HENT:  Head: Normocephalic and atraumatic.  Mouth/Throat: Oropharynx is clear and moist. No oropharyngeal exudate.  Eyes: Conjunctivae are normal. Right eye exhibits no discharge. Left eye exhibits no discharge. No scleral icterus.  Neck: Normal range of motion. Neck supple. No JVD present. No tracheal deviation present. No thyromegaly present.  Cardiovascular: Normal rate, regular rhythm, normal heart sounds and intact  distal pulses.  Exam reveals no gallop and no friction rub.   No murmur heard. Pulmonary/Chest: Effort normal and breath sounds normal. No stridor. No respiratory distress. He has no wheezes. He has no rales. He exhibits no tenderness.  Abdominal: Soft. Bowel sounds are normal. He exhibits no distension and no mass. There is no tenderness. There is no rebound and no guarding.  Musculoskeletal: Normal range of motion. He exhibits no edema and no tenderness.  Lymphadenopathy:    He has no cervical adenopathy.  Neurological: He is oriented to person, place, and time.  Skin: Skin is warm and dry. No rash noted. He is not diaphoretic. No erythema. No pallor.  Psychiatric: He has a normal mood and affect. His behavior is normal. Judgment and thought content normal.     Lab Results  Component Value Date   WBC 3.0* 09/26/2013   HGB 14.6 09/26/2013   HCT 43.8 09/26/2013   PLT 156.0 09/26/2013   GLUCOSE 96 09/26/2013   CHOL 150 04/10/2013   TRIG 108.0 04/10/2013   HDL 47.60 04/10/2013   LDLCALC 81 04/10/2013   ALT 71* 09/26/2013   AST 46* 09/26/2013   NA 137 09/26/2013   K 3.7 09/26/2013   CL 108 09/26/2013   CREATININE 2.1* 09/26/2013   BUN 25* 09/26/2013   CO2 20 09/26/2013   TSH 0.964 06/21/2007   INR 2.6* 04/17/2013   HGBA1C 5.7 04/17/2013       Assessment & Plan:

## 2013-10-08 NOTE — Patient Instructions (Signed)
Chronic Kidney Disease Chronic kidney disease occurs when the kidneys are damaged over a long period. The kidneys are two organs that lie on either side of the spine between the middle of the back and the front of the abdomen. The kidneys:   Remove wastes and extra water from the blood.   Produce important hormones. These help keep bones strong, regulate blood pressure, and help create red blood cells.   Balance the fluids and chemicals in the blood and tissues. A small amount of kidney damage may not cause problems, but a large amount of damage may make it difficult or impossible for the kidneys to work the way they should. If steps are not taken to slow down the kidney damage or stop it from getting worse, the kidneys may stop working permanently. Most of the time, chronic kidney disease does not go away. However, it can often be controlled, and those with the disease can usually live normal lives. CAUSES  The most common causes of chronic kidney disease are diabetes and high blood pressure (hypertension). Chronic kidney disease may also be caused by:   Diseases that cause kidneys' filters to become inflamed.   Diseases that affect the immune system.   Genetic diseases.   Medicines that damage the kidneys, such as anti-inflammatory medicines.  Poisoning or exposure to toxic substances.   A reoccurring kidney or urinary infection.   A problem with urine flow. This may be caused by:   Cancer.   Kidney stones.   An enlarged prostate in males. SYMPTOMS  Because the kidney damage in chronic kidney disease occurs slowly, symptoms develop slowly and may not be obvious until the kidney damage becomes severe. A person may have a kidney disease for years without showing any symptoms. Symptoms can include:   Swelling (edema) of the legs, ankles, or feet.   Tiredness (lethargy).   Nausea or vomiting.   Confusion.   Problems with urination, such as:   Decreased urine  production.   Frequent urination, especially at night.   Frequent accidents in children who are potty trained.   Muscle twitches and cramps.   Shortness of breath.  Weakness.   Persistent itchiness.   Loss of appetite.  Metallic taste in the mouth.  Trouble sleeping.  Slowed development in children.  Short stature in children. DIAGNOSIS  Chronic kidney disease may be detected and diagnosed by tests, including blood, urine, imaging, or kidney biopsy tests.  TREATMENT  Most chronic kidney diseases cannot be cured. Treatment usually involves relieving symptoms and preventing or slowing the progression of the disease. Treatment may include:   A special diet. You may need to avoid alcohol and foods thatare salty and high in potassium.   Medicines. These may:   Lower blood pressure.   Relieve anemia.   Relieve swelling.   Protect the bones. HOME CARE INSTRUCTIONS   Follow your prescribed diet.   Only take over-the-counter or prescription medicines as directed by your caregiver.  Do not take any new medicines (prescription, over-the-counter, or nutritional supplements) unless approved by your caregiver. Many medicines can worsen your kidney damage or need to have the dose adjusted.   Quit smoking if you are a smoker. Talk to your caregiver about a smoking cessation program.   Keep all follow-up appointments as directed by your caregiver. SEEK IMMEDIATE MEDICAL CARE IF:  Your symptoms get worse or you develop new symptoms.   You develop symptoms of end-stage kidney disease. These include:   Headaches.     Abnormally dark or light skin.   Numbness in the hands or feet.   Easy bruising.   Frequent hiccups.   Menstruation stops.   You have a fever.   You have decreased urine production.   You havepain or bleeding when urinating. MAKE SURE YOU:  Understand these instructions.  Will watch your condition.  Will get help right  away if you are not doing well or get worse. FOR MORE INFORMATION  American Association of Kidney Patients: www.aakp.org National Kidney Foundation: www.kidney.org American Kidney Fund: www.akfinc.org Life Options Rehabilitation Program: www.lifeoptions.org and www.kidneyschool.org Document Released: 03/07/2008 Document Revised: 05/15/2012 Document Reviewed: 01/26/2012 ExitCare Patient Information 2014 [employer].  

## 2013-10-13 ENCOUNTER — Encounter: Payer: Self-pay | Admitting: Internal Medicine

## 2013-10-13 NOTE — Assessment & Plan Note (Signed)
This is stable 

## 2013-10-14 ENCOUNTER — Ambulatory Visit (INDEPENDENT_AMBULATORY_CARE_PROVIDER_SITE_OTHER): Payer: 59 | Admitting: Professional

## 2013-10-14 DIAGNOSIS — F4323 Adjustment disorder with mixed anxiety and depressed mood: Secondary | ICD-10-CM

## 2013-10-15 ENCOUNTER — Encounter (HOSPITAL_COMMUNITY): Payer: 59

## 2013-10-15 DIAGNOSIS — J45909 Unspecified asthma, uncomplicated: Secondary | ICD-10-CM | POA: Insufficient documentation

## 2013-10-15 DIAGNOSIS — Z79899 Other long term (current) drug therapy: Secondary | ICD-10-CM | POA: Insufficient documentation

## 2013-10-15 DIAGNOSIS — Z5189 Encounter for other specified aftercare: Secondary | ICD-10-CM | POA: Insufficient documentation

## 2013-10-15 DIAGNOSIS — Z86718 Personal history of other venous thrombosis and embolism: Secondary | ICD-10-CM | POA: Insufficient documentation

## 2013-10-21 ENCOUNTER — Ambulatory Visit: Payer: 59 | Admitting: Professional

## 2013-10-22 ENCOUNTER — Encounter (HOSPITAL_COMMUNITY): Payer: 59

## 2013-10-28 ENCOUNTER — Ambulatory Visit (INDEPENDENT_AMBULATORY_CARE_PROVIDER_SITE_OTHER): Payer: 59 | Admitting: Professional

## 2013-10-28 ENCOUNTER — Encounter (HOSPITAL_COMMUNITY)
Admission: RE | Admit: 2013-10-28 | Discharge: 2013-10-28 | Disposition: A | Discharge status: Home / Self Care | Payer: 59 | Source: Ambulatory Visit | Attending: Internal Medicine | Admitting: Internal Medicine

## 2013-10-28 DIAGNOSIS — F4323 Adjustment disorder with mixed anxiety and depressed mood: Secondary | ICD-10-CM

## 2013-10-29 ENCOUNTER — Encounter (HOSPITAL_COMMUNITY): Payer: 59

## 2013-10-31 ENCOUNTER — Ambulatory Visit: Payer: Self-pay | Admitting: Internal Medicine

## 2013-11-02 ENCOUNTER — Other Ambulatory Visit: Payer: Self-pay | Admitting: Internal Medicine

## 2013-11-04 ENCOUNTER — Ambulatory Visit: Payer: 59 | Admitting: Professional

## 2013-11-05 ENCOUNTER — Other Ambulatory Visit: Payer: Self-pay | Admitting: Emergency Medicine

## 2013-11-05 ENCOUNTER — Encounter (HOSPITAL_COMMUNITY): Payer: 59

## 2013-11-06 ENCOUNTER — Encounter (HOSPITAL_COMMUNITY)
Admission: RE | Admit: 2013-11-06 | Discharge: 2013-11-06 | Disposition: A | Discharge status: Home / Self Care | Payer: 59 | Source: Ambulatory Visit | Attending: Internal Medicine | Admitting: Internal Medicine

## 2013-11-11 ENCOUNTER — Other Ambulatory Visit: Payer: Self-pay | Admitting: Adult Health

## 2013-11-11 ENCOUNTER — Ambulatory Visit: Payer: 59 | Admitting: Professional

## 2013-11-12 ENCOUNTER — Encounter (HOSPITAL_COMMUNITY): Payer: 59

## 2013-11-12 DIAGNOSIS — Z79899 Other long term (current) drug therapy: Secondary | ICD-10-CM | POA: Insufficient documentation

## 2013-11-12 DIAGNOSIS — J45909 Unspecified asthma, uncomplicated: Secondary | ICD-10-CM | POA: Insufficient documentation

## 2013-11-12 DIAGNOSIS — Z86718 Personal history of other venous thrombosis and embolism: Secondary | ICD-10-CM | POA: Insufficient documentation

## 2013-11-12 DIAGNOSIS — Z5189 Encounter for other specified aftercare: Secondary | ICD-10-CM | POA: Insufficient documentation

## 2013-11-12 NOTE — Telephone Encounter (Signed)
Please advise MR. Pt requesting refill on cough syrup. Last giving 10/11/13 #240 ML x 0 refills Last OV with TP 10/03/13 and NS for appt 10/31/13. No penidng appt thanks

## 2013-11-18 ENCOUNTER — Ambulatory Visit (INDEPENDENT_AMBULATORY_CARE_PROVIDER_SITE_OTHER): Payer: 59 | Admitting: Professional

## 2013-11-18 DIAGNOSIS — F4323 Adjustment disorder with mixed anxiety and depressed mood: Secondary | ICD-10-CM

## 2013-11-18 MED ORDER — HYDROCOD POLST-CHLORPHEN POLST 10-8 MG/5ML PO LQCR: 5.0000 mL | Freq: Two times a day (BID) | ORAL | Status: DC | PRN

## 2013-11-18 NOTE — Telephone Encounter (Signed)
Per tp okay to refill. rx printed and signed. Giving to pt

## 2013-11-18 NOTE — Telephone Encounter (Signed)
TP last saw pt. Will send for recs. Please advise TP thanks

## 2013-11-18 NOTE — Telephone Encounter (Signed)
PT IS STILL WAITING ON THIS RX [phone] PT WILL BE IN PULM REHAB TILL ABOUT 12 HE HAS BEEN OUT FOR A WEEK  LEAVE A MSG

## 2013-11-19 ENCOUNTER — Encounter (HOSPITAL_COMMUNITY): Payer: 59

## 2013-11-20 ENCOUNTER — Encounter (HOSPITAL_COMMUNITY)
Admission: RE | Admit: 2013-11-20 | Discharge: 2013-11-20 | Disposition: A | Discharge status: Home / Self Care | Payer: 59 | Source: Ambulatory Visit | Attending: Internal Medicine | Admitting: Internal Medicine

## 2013-11-25 ENCOUNTER — Encounter (HOSPITAL_COMMUNITY)
Admission: RE | Admit: 2013-11-25 | Discharge: 2013-11-25 | Disposition: A | Discharge status: Home / Self Care | Payer: 59 | Source: Ambulatory Visit | Attending: Internal Medicine | Admitting: Internal Medicine

## 2013-11-25 ENCOUNTER — Ambulatory Visit (INDEPENDENT_AMBULATORY_CARE_PROVIDER_SITE_OTHER): Payer: 59 | Admitting: Professional

## 2013-11-25 ENCOUNTER — Telehealth: Payer: Self-pay | Admitting: General Practice

## 2013-11-25 DIAGNOSIS — F4323 Adjustment disorder with mixed anxiety and depressed mood: Secondary | ICD-10-CM

## 2013-11-26 ENCOUNTER — Encounter (HOSPITAL_COMMUNITY): Payer: 59

## 2013-11-27 ENCOUNTER — Encounter (HOSPITAL_COMMUNITY): Payer: 59

## 2013-12-02 ENCOUNTER — Ambulatory Visit: Payer: 59 | Admitting: Professional

## 2013-12-02 ENCOUNTER — Encounter (HOSPITAL_COMMUNITY): Payer: 59

## 2013-12-03 ENCOUNTER — Encounter (HOSPITAL_COMMUNITY): Payer: 59

## 2013-12-03 NOTE — Telephone Encounter (Signed)
Opened in error

## 2013-12-04 ENCOUNTER — Encounter (HOSPITAL_COMMUNITY)
Admission: RE | Admit: 2013-12-04 | Discharge: 2013-12-04 | Disposition: A | Discharge status: Home / Self Care | Payer: 59 | Source: Ambulatory Visit | Attending: Internal Medicine | Admitting: Internal Medicine

## 2013-12-04 ENCOUNTER — Ambulatory Visit: Payer: Self-pay | Admitting: Family Medicine

## 2013-12-09 ENCOUNTER — Ambulatory Visit (INDEPENDENT_AMBULATORY_CARE_PROVIDER_SITE_OTHER): Payer: 59 | Admitting: Professional

## 2013-12-09 ENCOUNTER — Encounter (HOSPITAL_COMMUNITY)
Admission: RE | Admit: 2013-12-09 | Discharge: 2013-12-09 | Disposition: A | Discharge status: Home / Self Care | Payer: 59 | Source: Ambulatory Visit | Attending: Internal Medicine | Admitting: Internal Medicine

## 2013-12-09 DIAGNOSIS — F4323 Adjustment disorder with mixed anxiety and depressed mood: Secondary | ICD-10-CM

## 2013-12-10 ENCOUNTER — Encounter (HOSPITAL_COMMUNITY): Payer: 59

## 2013-12-11 ENCOUNTER — Encounter (HOSPITAL_COMMUNITY)
Admission: RE | Admit: 2013-12-11 | Discharge: 2013-12-11 | Disposition: A | Discharge status: Home / Self Care | Payer: 59 | Source: Ambulatory Visit | Attending: Internal Medicine | Admitting: Internal Medicine

## 2013-12-11 DIAGNOSIS — Z79899 Other long term (current) drug therapy: Secondary | ICD-10-CM | POA: Diagnosis not present

## 2013-12-11 DIAGNOSIS — J45909 Unspecified asthma, uncomplicated: Secondary | ICD-10-CM | POA: Diagnosis not present

## 2013-12-11 DIAGNOSIS — Z86718 Personal history of other venous thrombosis and embolism: Secondary | ICD-10-CM | POA: Insufficient documentation

## 2013-12-11 DIAGNOSIS — Z5189 Encounter for other specified aftercare: Secondary | ICD-10-CM | POA: Insufficient documentation

## 2013-12-14 ENCOUNTER — Other Ambulatory Visit: Payer: Self-pay | Admitting: Internal Medicine

## 2013-12-16 ENCOUNTER — Encounter (HOSPITAL_COMMUNITY): Payer: 59

## 2013-12-16 ENCOUNTER — Ambulatory Visit: Payer: 59 | Admitting: Professional

## 2013-12-16 ENCOUNTER — Other Ambulatory Visit: Payer: Self-pay | Admitting: Internal Medicine

## 2013-12-16 ENCOUNTER — Other Ambulatory Visit: Payer: Self-pay | Admitting: Adult Health

## 2013-12-17 ENCOUNTER — Telehealth: Payer: Self-pay | Admitting: Internal Medicine

## 2013-12-17 ENCOUNTER — Encounter (HOSPITAL_COMMUNITY): Payer: 59

## 2013-12-17 NOTE — Telephone Encounter (Signed)
Pt last had refill on tussionex 11/18/13 #240 ML x0 refills by TP Tessalon last refilled 11/06/13 #180 x 0 refills 2 caps TID Last OV 10/03/13 No pending appt Please advise MR thanks

## 2013-12-18 ENCOUNTER — Encounter (HOSPITAL_COMMUNITY): Payer: 59

## 2013-12-18 NOTE — Telephone Encounter (Signed)
Ok for tussionex but I can only sign on 12/22/13 Monday when I am in office  Also, please let him know that I am going to [place] 12/23/13 and if he is interested in Pirfenex (pirfenidone of [place]). If so, how many days supply? I can try but cannot guarantee  Thanks  Dr. Kalman ShanMurali Coraline Talwar, M.D., [facility] Pulmonary and Critical Care Medicine Staff Physician [facility] [place] [facility] Pager: [phone], If no answer or between  15:00h - 7:00h: call [phone]  12/18/2013 6:52 PM

## 2013-12-19 NOTE — Telephone Encounter (Signed)
Called and spoke with pt and he stated that he just got a new RX for the pirfenidone and does not need anymore.    Pt stated that he will get the rx for the tussionex on Monday and would also like the tessalon called to the pharmacy.  MR please advise if ok to send in the same rx as before.  Thanks

## 2013-12-19 NOTE — Telephone Encounter (Signed)
lmomtcb x1 for pt 

## 2013-12-19 NOTE — Telephone Encounter (Signed)
Please leave a detailed msg on his phone [phone]

## 2013-12-20 NOTE — Telephone Encounter (Signed)
Ok for [employer] [employer]

## 2013-12-22 MED ORDER — HYDROCOD POLST-CHLORPHEN POLST 10-8 MG/5ML PO LQCR
5.0000 mL | Freq: Two times a day (BID) | ORAL | Status: DC | PRN
Start: 2013-12-22 — End: 2014-01-27

## 2013-12-22 MED ORDER — BENZONATATE 100 MG PO CAPS: ORAL_CAPSULE | ORAL | Status: DC

## 2013-12-22 NOTE — Telephone Encounter (Signed)
LMTCB regarding Tussionex--- rx left up front for pt to pick up.

## 2013-12-22 NOTE — Telephone Encounter (Signed)
Signed and gave to elise nagle 12/22/2013

## 2013-12-22 NOTE — Telephone Encounter (Signed)
RX for tess has been sent to the pharm. Rx for tussionex printed for MR to sign. Please advise once done thanks

## 2013-12-23 ENCOUNTER — Encounter (HOSPITAL_COMMUNITY): Payer: 59

## 2013-12-23 NOTE — Telephone Encounter (Signed)
[name] please advise if Rx has been placed up front to be picked up. Thanks.

## 2013-12-23 NOTE — Telephone Encounter (Signed)
LMTCB  Left message on machine that medication is at front to be picked up and to call back to inform us he has received the message.

## 2013-12-24 ENCOUNTER — Encounter (HOSPITAL_COMMUNITY): Payer: 59

## 2013-12-24 NOTE — Telephone Encounter (Signed)
Called and spoke to pt. Informed pt that the rx for tussionex is ready for pick up. Pt stated his dad, Delice BisonGary Opdahl, will pick it up for him. Rx envelope has been updated that Jillyn HiddenGary will be the person to pick up rx. Nothing further needed at this time.

## 2013-12-25 ENCOUNTER — Encounter (HOSPITAL_COMMUNITY): Payer: 59

## 2013-12-30 ENCOUNTER — Encounter (HOSPITAL_COMMUNITY): Payer: 59

## 2013-12-31 ENCOUNTER — Encounter (HOSPITAL_COMMUNITY): Payer: 59

## 2014-01-01 ENCOUNTER — Encounter (HOSPITAL_COMMUNITY): Payer: 59

## 2014-01-06 ENCOUNTER — Telehealth (HOSPITAL_COMMUNITY): Payer: Self-pay | Admitting: *Deleted

## 2014-01-06 ENCOUNTER — Encounter (HOSPITAL_COMMUNITY): Payer: 59

## 2014-01-07 ENCOUNTER — Encounter (HOSPITAL_COMMUNITY): Payer: 59

## 2014-01-08 ENCOUNTER — Encounter (HOSPITAL_COMMUNITY): Payer: 59

## 2014-01-13 ENCOUNTER — Encounter (HOSPITAL_COMMUNITY): Payer: 59 | Attending: Internal Medicine

## 2014-01-13 DIAGNOSIS — Z79899 Other long term (current) drug therapy: Secondary | ICD-10-CM | POA: Insufficient documentation

## 2014-01-13 DIAGNOSIS — Z5189 Encounter for other specified aftercare: Secondary | ICD-10-CM | POA: Insufficient documentation

## 2014-01-13 DIAGNOSIS — Z86718 Personal history of other venous thrombosis and embolism: Secondary | ICD-10-CM | POA: Diagnosis not present

## 2014-01-13 DIAGNOSIS — J45909 Unspecified asthma, uncomplicated: Secondary | ICD-10-CM | POA: Insufficient documentation

## 2014-01-14 ENCOUNTER — Encounter (HOSPITAL_COMMUNITY): Payer: 59

## 2014-01-15 ENCOUNTER — Encounter (HOSPITAL_COMMUNITY): Payer: 59

## 2014-01-16 ENCOUNTER — Other Ambulatory Visit: Payer: Self-pay | Admitting: Internal Medicine

## 2014-01-20 ENCOUNTER — Encounter (HOSPITAL_COMMUNITY): Payer: 59

## 2014-01-20 NOTE — Telephone Encounter (Signed)
Pt will need an appt before refilling medication.

## 2014-01-21 ENCOUNTER — Encounter (HOSPITAL_COMMUNITY): Payer: 59

## 2014-01-22 ENCOUNTER — Encounter (HOSPITAL_COMMUNITY): Payer: 59

## 2014-01-24 ENCOUNTER — Other Ambulatory Visit: Payer: Self-pay | Admitting: Internal Medicine

## 2014-01-27 ENCOUNTER — Telehealth: Payer: Self-pay | Admitting: Adult Health

## 2014-01-27 ENCOUNTER — Ambulatory Visit (INDEPENDENT_AMBULATORY_CARE_PROVIDER_SITE_OTHER): Payer: 59 | Admitting: Adult Health

## 2014-01-27 ENCOUNTER — Other Ambulatory Visit: Payer: 59

## 2014-01-27 ENCOUNTER — Encounter (HOSPITAL_COMMUNITY): Payer: 59

## 2014-01-27 ENCOUNTER — Encounter: Payer: Self-pay | Admitting: Adult Health

## 2014-01-27 VITALS — BP 104/70 | HR 109 | Temp 98.4°F | Ht 73.0 in | Wt 229.0 lb

## 2014-01-27 DIAGNOSIS — J069 Acute upper respiratory infection, unspecified: Secondary | ICD-10-CM | POA: Insufficient documentation

## 2014-01-27 DIAGNOSIS — J849 Interstitial pulmonary disease, unspecified: Secondary | ICD-10-CM

## 2014-01-27 DIAGNOSIS — J029 Acute pharyngitis, unspecified: Secondary | ICD-10-CM

## 2014-01-27 DIAGNOSIS — J8410 Pulmonary fibrosis, unspecified: Secondary | ICD-10-CM

## 2014-01-27 DIAGNOSIS — R509 Fever, unspecified: Secondary | ICD-10-CM

## 2014-01-27 LAB — BETA STREP SCREEN: STREPTOCOCCUS, GROUP A SCREEN (DIRECT): NEGATIVE

## 2014-01-27 MED ORDER — DOXYCYCLINE HYCLATE 100 MG PO TABS: 100.0000 mg | ORAL_TABLET | Freq: Two times a day (BID) | ORAL | Status: DC

## 2014-01-27 MED ORDER — HYDROCOD POLST-CHLORPHEN POLST 10-8 MG/5ML PO LQCR: 5.0000 mL | Freq: Two times a day (BID) | ORAL | Status: DC | PRN

## 2014-01-27 NOTE — Assessment & Plan Note (Signed)
Undergoing workup in [place] for possible transplant  PFT on return per pt request

## 2014-01-27 NOTE — Addendum Note (Signed)
Addended by: Ronny BaconWELCHEL, Demitria Hay C on: 01/27/2014 05:13 PM   Modules accepted: Orders

## 2014-01-27 NOTE — Progress Notes (Signed)
Subjective:    Patient ID: [name], male    DOB: Jun 25, 1980, 33 y.o.   MRN: [id number]  HPI  PCP [name], MD Heme-ONc - [name] - [name] + [facility] (Was followed by Dr [name] through Sept 2014 and then switched to Dr Elly Modenaamswamy) Renal:  DR [name] your nephrologist  HPI  # asthma, allergic rhinitis NOS  # recurrent respiratory infections NOS  #Venous Thrombo-Embolism- [facility]  - ? First episode after colectomy in 2012  - Doppler- Had L popliteal vein DVT on 07/30/12  - 02/26/13  = POSITIVE DVT: acute deep vein thrombosis involving the left popliteal and distal femoral veins.  - [facility]  #Chronic Renal Insufficiency  - Creat 2.3mg % - Nov 2014  - [facility]  #obesity  - Body mass index is 28.4 kg/(m^2). on 07/17/2013: this is a result of aggressie weight loss program since fall 2014  - on coumadin   #REcurrent bronchitis/respiratory tract infection    #Chronic Pain ad chronic benzo  - fentanyl patch  #Hermansky-Pudlak Syndrome  #Inflammatory bowel disease; Chrons' - s/p Total colectomy 2012   # albinism,     - on TNF alpha - [facility]oni throiugh [facility]   #Pulmonary fibrosis. - new since spring 2014   -  April 2014: FVC 4.30/74%, FEV1 3.45/77%, FEV1/FVC 0.80. TLC 92%, RV 121%, DLCO 40%.   -  CT 10/03/12 : IMPRESSION: Slightly upper lobe predominant peribronchovascular fibrosis, as  detailed above. This is new since 07/28/2008   - May 2014: 3L with exertion  - - Aug 2014: 6L with exertion. sTarted pirfenidone from [place], 200mg  x 4, TID \   - Nov 2014: Tussionex for cough   - [facility]ected for [facility] kidney transplant at [facility] 2015   -  PFT 05/29/13  FVC  4.16L/69% -> post BD 4.26L/71%  (jn aprl Dr [name] noted it was 4.3L, so this is same) TLC 5.36L/71% (in April it says 92% but I do not have actual number with  me) DLCO 14.3/39% (in April Dr [name] noted it ws 40%, so I would say same) moderate severity ILD in Dec 2014 PFT. I am not sure if is worse than April 2014 but possibly same.   [name].   OV 07/01/2013  Chief Complaint  Patient presents with  . Follow-up    for ILD.  Pt is c/o having pain in chest. He has been cutting down on fentanyl, he is currently not on any.    Here with dad  Overall stable. Main issue is that he just [facility] an [name] because he woul need lung and kidney transplant. He is upset. He says he wants to die fighthing on an operating table. He wants my help to look at other [name]. He says he has done all he can to get a transplant.   He is off fent patches and his joint pain is worse; wants referral to acupuncture  In terms of ILD: he is having supply problems from [place]. W[name] to see if they will give him pirfenidone. Cough persists. Wants refills    OV 08/29/2013  Chief Complaint  Patient presents with  . Follow-up    for ILD.   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 After last visit, I  called [facility] Dr Zola Button and due to poor renal function he is not a candidate. Suggested he try to to get in with [facility] multidisciplinary clinic first; He wil try [facility] [place] too. [place]. Advised not to overs stretch with too many transplant evals. [name] He wants a letter from me summarizing his medical problems: given. He gave copies of summary of his medical problems. Wants bmet check. Wants tussionex refill. Now back on regular supply of PIRFENEX from [place]. Tolerating well. Doing rehab: says his [facility] fitness is current best ever in his adult life.   No results found for this basename: HGB, HCT, WBC, PLT,  in the last 168 hours    09/23/2013 Acute OV  MR pt here for increased sinus  pressure w/ minimal drainage, PND, prod cough with minimal yellow/green mucus, fever yesterday/today up to 101, N/D.  taking Mucinex, Sudafed, increased fluids/rest.  >tx w/ Doxcycline 100mg  Twice daily  For 7 days  Mucinex DM Twice daily  As needed  Cough/congestion  Fluids and rest , Saline nasal rinses As needed   CXR w/ chronic changes    OV 09/26/2013  Chief Complaint  Patient presents with  . Acute Visit    c/o Increased SOB with any exertion, chest congestion, mostly nonprod cough with some clear mucous X2 days.     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 His entire family was sick. They all have recovered but he has not. He is completed 10 days of Avelox. Is currently on day 3 out of day 7 of doxycycline. Since seeing a nurse practitioner on 09/23/2013 he then has new shortness of breath. He is compliant with his anticoagulations her prior pulmonary embolism. Is extremely fatigued. He also has persistent cough. He has lost 12 pounds of weight through all this illness. His urine output is adequate. He is using 6 L of oxygen which is his baseline but he feels that instead of mid 90s he is at low 90s.  Walk test 185 feet x 3 laps on 6L [place]: Walk only 2 laps. Resting heart rate was 118 per minute. Resting pulse ox was 100%. At 2 laps he got extremely fatigued and stopped. His peak heart rate rose up 142 per minute while the saturation stayed at 99%.  Also orthostatic vital signs suggest that he might be mildly dehydrated. Systolic blood pressure 126 laying down. Heart rate of 93 per minute laying down. Standing systolic blood pressure went down to 118 and heart rate rose up to 118 per minute >>pred taper , labs   10/03/13 Follow up  Pt returns for follow up for recent slow to resolve bronchitic flare . Tx w/ doxycycline and steroid taper.  CXR w/ no acute process, chronic changes.   Labs showed chronic changes with renal insuffciency. LFT w/ slight bump , WBC 3K .   Pt states breathing has improved. Pt states he has still had a productive cough with clear mucous. Denies CP. Denies SOB when wearing O2.  No hemoptysis, chest pain, hemoptysis, fever or n/v/d. Eating well.  >>no changes   01/27/2014 Acute OV  Complains of 1 week of fever 101, cough , nasal stuffiness, chills. Cough is mainly dry , no discolored mucus. No hemoptysis, chest pain ,  rash, bloody stools.  Taking tylenol and advil , using heating blanket to stay warm.  Family members have been sick with URI symptoms.  Had a lot of exposure to public last week with fundraiser for his transplant . Raised $10,000.  Is currently undergoing [facility] for [facility] of transplant-  Requests PFT on return for monitoring for his possible upcoming transplant.  Does complain of slight sore throat.    Review of Systems  Constitutional: Negative for fever and unexpected weight change.  HENT:   Negative for dental problem, ear pain, nosebleeds,  and trouble swallowing.   Eyes: Negative for redness and itching.  Respiratory: Positive for  shortness of breath. Negative for chest tightness and wheezing.   Cardiovascular: Negative for palpitations and leg swelling.  Gastrointestinal: Negative for nausea and vomiting.  Genitourinary: Negative for dysuria.  Musculoskeletal: Negative for joint swelling.  Skin: Negative for rash.  Neurological: Negative for headaches.  Hematological: Does not bruise/bleed easily.  Psychiatric/Behavioral: Negative for dysphoric mood. The patient is not nervous/anxious.        Objective:   Physical Exam  Physical Exam GEN: A/Ox3; pleasant , NAD  HEENT:  [place]/AT,  EACs-clear, TMs-wnl, NOSE-clear mild redness  w/ clear drainage , THROAT-clear, no lesions, no postnasal drip or exudate noted.   NECK:  Supple w/ fair ROM; no JVD; normal carotid impulses  w/o bruits; no thyromegaly or nodules palpated; no lymphadenopathy.  RESP  NO  crackles w/o, wheezes/ o accessory muscle use, no dullness to percussion  CARD:  RRR, no m/r/g  , no peripheral edema, pulses intact, no cyanosis or clubbing.  GI:   Soft & nt; nml bowel sounds; no organomegaly or masses detected.ileostomy   Musco: Warm bil, no deformities or joint swelling noted.   Neuro: alert, no focal deficits noted.    Skin: Warm, no lesions or rashes, albino          Assessment & Plan:

## 2014-01-27 NOTE — Patient Instructions (Signed)
Strep test today  Doxcycline 100mg  Twice daily  For 7 days -to have on hold if symptoms worsen with discolored mucus .  Mucinex DM Twice daily  As needed  Cough/congestion  Fluids and rest  Saline nasal rinses As needed   Please contact office for sooner follow up if symptoms do not improve or worsen or seek emergency care  Follow up Dr. [name] in 6 weeks with PFT

## 2014-01-27 NOTE — Assessment & Plan Note (Signed)
Suspect Viral illness however , immunosuppressed pt  abx to have onhold  Check strep test   Plan  Strep test today  Doxcycline 100mg  Twice daily  For 7 days -to have on hold if symptoms worsen with discolored mucus .  Mucinex DM Twice daily  As needed  Cough/congestion  Fluids and rest  Saline nasal rinses As needed   Please contact office for sooner follow up if symptoms do not improve or worsen or seek emergency care  Follow up Dr. [name] in 6 weeks with PFT

## 2014-01-27 NOTE — Telephone Encounter (Signed)
Per Tammy P, let pt know strep test is negative.  He should continue with today's OV recs and call if needed.  Called, spoke with pt.  Informed him of above per TP.  He verbalized understanding, is in agreement with this plan, and voiced no further questions or concerns at this time.

## 2014-01-28 ENCOUNTER — Encounter (HOSPITAL_COMMUNITY): Payer: 59

## 2014-01-29 ENCOUNTER — Encounter (HOSPITAL_COMMUNITY): Payer: 59

## 2014-01-30 ENCOUNTER — Telehealth: Payer: Self-pay | Admitting: Internal Medicine

## 2014-01-30 MED ORDER — AMOXICILLIN-POT CLAVULANATE 875-125 MG PO TABS: 1.0000 | ORAL_TABLET | Freq: Two times a day (BID) | ORAL | Status: DC

## 2014-01-30 NOTE — Telephone Encounter (Signed)
Augmentin 875 mg take one pill twice daily  X 10 days - take at breakfast and supper with large glass of water.  It would help reduce the usual side effects (diarrhea and yeast infections) if you ate cultured yogurt at lunch.  

## 2014-01-30 NOTE — Telephone Encounter (Signed)
Pt aware of recs. RX called in. Nothing further needed 

## 2014-01-30 NOTE — Telephone Encounter (Signed)
Per 8/18 OV w/ TP: Patient Instructions      Strep test today   Doxcycline 100mg  Twice daily  For 7 days -to have on hold if symptoms worsen with discolored mucus .   Mucinex DM Twice daily  As needed  Cough/congestion   Fluids and rest   Saline nasal rinses As needed    Please contact office for sooner follow up if symptoms do not improve or worsen or seek emergency care   Follow up Dr. [name] in 6 weeks with PFT       Called spoke with pt. He reports he started the ABX doxy on Monday. He reports he still coughing up green phlem, chest congestion, off and on fever of 100-101. Taking tylenol PRN. No wheezing, no chest tx.  He is requesting augmentin. MR is off. Please advise MW thanks  Allergies  Allergen Reactions  . Avocado   . Carrot [Daucus Carota]   . Cherry   . Metronidazole     REACTION: neuropathy  . Nsaids Other (See Comments)    Plate defect  . Watermelon [Citrullus Vulgaris]

## 2014-01-31 ENCOUNTER — Other Ambulatory Visit: Payer: Self-pay | Admitting: Internal Medicine

## 2014-02-03 ENCOUNTER — Encounter (HOSPITAL_COMMUNITY): Payer: 59

## 2014-02-05 ENCOUNTER — Encounter (HOSPITAL_COMMUNITY): Payer: 59

## 2014-02-06 NOTE — Telephone Encounter (Signed)
Pt requesting refill on Tessalon. Pt last seen on 01/27/14 by TP. Tessalon last refilled on 12/22/13 for 180 tablets with no refills.   MR please advise if ok to fill.

## 2014-02-09 ENCOUNTER — Other Ambulatory Visit: Payer: Self-pay | Admitting: Internal Medicine

## 2014-02-10 ENCOUNTER — Encounter (HOSPITAL_COMMUNITY): Payer: 59 | Attending: Internal Medicine

## 2014-02-10 DIAGNOSIS — J45909 Unspecified asthma, uncomplicated: Secondary | ICD-10-CM | POA: Insufficient documentation

## 2014-02-10 DIAGNOSIS — Z86718 Personal history of other venous thrombosis and embolism: Secondary | ICD-10-CM | POA: Diagnosis not present

## 2014-02-10 DIAGNOSIS — Z79899 Other long term (current) drug therapy: Secondary | ICD-10-CM | POA: Diagnosis not present

## 2014-02-10 DIAGNOSIS — Z5189 Encounter for other specified aftercare: Secondary | ICD-10-CM | POA: Diagnosis present

## 2014-02-10 MED ORDER — BENZONATATE 100 MG PO CAPS: ORAL_CAPSULE | ORAL | Status: DC

## 2014-02-12 ENCOUNTER — Encounter (HOSPITAL_COMMUNITY): Payer: 59

## 2014-02-17 ENCOUNTER — Encounter (HOSPITAL_COMMUNITY): Payer: 59

## 2014-02-19 ENCOUNTER — Encounter (HOSPITAL_COMMUNITY): Payer: 59

## 2014-02-24 ENCOUNTER — Encounter (HOSPITAL_COMMUNITY): Admission: RE | Admit: 2014-02-24 | Payer: 59 | Source: Ambulatory Visit

## 2014-02-26 ENCOUNTER — Encounter (HOSPITAL_COMMUNITY): Payer: 59

## 2014-03-03 ENCOUNTER — Encounter (HOSPITAL_COMMUNITY): Admission: RE | Admit: 2014-03-03 | Payer: 59 | Source: Ambulatory Visit

## 2014-03-05 ENCOUNTER — Encounter (HOSPITAL_COMMUNITY): Payer: 59

## 2014-03-10 ENCOUNTER — Other Ambulatory Visit (INDEPENDENT_AMBULATORY_CARE_PROVIDER_SITE_OTHER): Payer: 59

## 2014-03-10 ENCOUNTER — Encounter: Payer: Self-pay | Admitting: Internal Medicine

## 2014-03-10 ENCOUNTER — Ambulatory Visit (INDEPENDENT_AMBULATORY_CARE_PROVIDER_SITE_OTHER): Payer: 59 | Admitting: Internal Medicine

## 2014-03-10 ENCOUNTER — Telehealth (HOSPITAL_COMMUNITY): Payer: Self-pay | Admitting: *Deleted

## 2014-03-10 ENCOUNTER — Encounter (HOSPITAL_COMMUNITY): Payer: 59

## 2014-03-10 VITALS — BP 102/82 | HR 118 | Ht 73.0 in | Wt 223.0 lb

## 2014-03-10 DIAGNOSIS — J849 Interstitial pulmonary disease, unspecified: Secondary | ICD-10-CM

## 2014-03-10 DIAGNOSIS — R0902 Hypoxemia: Secondary | ICD-10-CM

## 2014-03-10 DIAGNOSIS — E7089 Other disorders of aromatic amino-acid metabolism: Secondary | ICD-10-CM

## 2014-03-10 DIAGNOSIS — Z23 Encounter for immunization: Secondary | ICD-10-CM

## 2014-03-10 DIAGNOSIS — D649 Anemia, unspecified: Secondary | ICD-10-CM

## 2014-03-10 DIAGNOSIS — J8410 Pulmonary fibrosis, unspecified: Secondary | ICD-10-CM

## 2014-03-10 DIAGNOSIS — E70331 Hermansky-Pudlak syndrome: Secondary | ICD-10-CM

## 2014-03-10 DIAGNOSIS — N189 Chronic kidney disease, unspecified: Secondary | ICD-10-CM

## 2014-03-10 DIAGNOSIS — E708 Other disorders of aromatic amino-acid metabolism: Secondary | ICD-10-CM

## 2014-03-10 DIAGNOSIS — E559 Vitamin D deficiency, unspecified: Secondary | ICD-10-CM

## 2014-03-10 DIAGNOSIS — J9610 Chronic respiratory failure, unspecified whether with hypoxia or hypercapnia: Secondary | ICD-10-CM

## 2014-03-10 DIAGNOSIS — J9611 Chronic respiratory failure with hypoxia: Secondary | ICD-10-CM | POA: Insufficient documentation

## 2014-03-10 LAB — PULMONARY FUNCTION TEST
DL/VA % pred: 68 %
DL/VA: 3.29 ml/min/mmHg/L
DLCO unc % pred: 40 %
DLCO unc: 14.79 ml/min/mmHg
FEF 25-75 Post: 2.92 L/sec
FEF 25-75 Pre: 1.71 L/sec
FEF2575-%Change-Post: 70 %
FEF2575-%PRED-PRE: 36 %
FEF2575-%Pred-Post: 62 %
FEV1-%Change-Post: 15 %
FEV1-%PRED-PRE: 56 %
FEV1-%Pred-Post: 65 %
FEV1-Post: 3.15 L
FEV1-Pre: 2.71 L
FEV1FVC-%CHANGE-POST: 10 %
FEV1FVC-%PRED-PRE: 85 %
FEV6-%Change-Post: 5 %
FEV6-%PRED-PRE: 65 %
FEV6-%Pred-Post: 69 %
FEV6-PRE: 3.84 L
FEV6-Post: 4.07 L
FEV6FVC-%Change-Post: 0 %
FEV6FVC-%PRED-POST: 101 %
FEV6FVC-%PRED-PRE: 100 %
FVC-%Change-Post: 4 %
FVC-%PRED-POST: 68 %
FVC-%Pred-Pre: 65 %
FVC-POST: 4.09 L
FVC-Pre: 3.9 L
POST FEV1/FVC RATIO: 77 %
PRE FEV1/FVC RATIO: 69 %
PRE FEV6/FVC RATIO: 98 %
Post FEV6/FVC ratio: 99 %
RV % pred: 74 %
RV: 1.37 L
TLC % pred: 67 %
TLC: 5.07 L

## 2014-03-10 LAB — CBC WITH DIFFERENTIAL/PLATELET
BASOS ABS: 0 10*3/uL (ref 0.0–0.1)
Basophils Relative: 0.3 % (ref 0.0–3.0)
Eosinophils Absolute: 0.1 10*3/uL (ref 0.0–0.7)
Eosinophils Relative: 0.7 % (ref 0.0–5.0)
HEMATOCRIT: 41.8 % (ref 39.0–52.0)
Hemoglobin: 14 g/dL (ref 13.0–17.0)
Lymphocytes Relative: 27.8 % (ref 12.0–46.0)
Lymphs Abs: 3 10*3/uL (ref 0.7–4.0)
MCHC: 33.6 g/dL (ref 30.0–36.0)
MCV: 84.4 fl (ref 78.0–100.0)
Monocytes Absolute: 1 10*3/uL (ref 0.1–1.0)
Monocytes Relative: 9.6 % (ref 3.0–12.0)
Neutro Abs: 6.6 10*3/uL (ref 1.4–7.7)
Neutrophils Relative %: 61.6 % (ref 43.0–77.0)
PLATELETS: 176 10*3/uL (ref 150.0–400.0)
RBC: 4.94 Mil/uL (ref 4.22–5.81)
RDW: 14 % (ref 11.5–15.5)
WBC: 10.7 10*3/uL — ABNORMAL HIGH (ref 4.0–10.5)

## 2014-03-10 MED ORDER — HYDROCOD POLST-CHLORPHEN POLST 10-8 MG/5ML PO LQCR: 5.0000 mL | Freq: Two times a day (BID) | ORAL | Status: DC | PRN

## 2014-03-10 MED ORDER — ALBUTEROL SULFATE HFA 108 (90 BASE) MCG/ACT IN AERS: 2.0000 | INHALATION_SPRAY | Freq: Four times a day (QID) | RESPIRATORY_TRACT | Status: AC | PRN

## 2014-03-10 NOTE — Progress Notes (Signed)
PFT done today. 

## 2014-03-10 NOTE — Patient Instructions (Signed)
#  ILD  - mild to no decline in lung functiin since dec 2014  - continue o2  - continue pirfenex 200mg  tablet but reduce to 3tab , 3 times a day with meals (reduction due to renal issues)  - keep up the hard work on transplant front  - glad uptodate with vaccines  -refill tussionex for cough   #Vit D Def  - recheck levels today  - samples I have given you are high dose, so be careful about taking it without level monitoring   #FOllowup  3 months with Dr [name]

## 2014-03-10 NOTE — Progress Notes (Signed)
Subjective:    Patient ID: [name], male    DOB: 1980-07-21, 33 y.o.   MRN: [id number]  HPI    PCP [name], MD Heme-ONc - [name] - [name] [facility] (Was followed by Dr [name] through Sept 2014 and then switched to Dr Elly Modenaamswamy) Renal:  DR [name] your nephrologist  PROB LIST  # asthma, allergic rhinitis NOS  # recurrent respiratory infections NOS  #Venous Thrombo-Embolism- Recurrent DVT: [facility]  - ? First episode after colectomy in 2012  - Doppler- Had L popliteal vein DVT on 07/30/12  - 02/26/13  = POSITIVE DVT: acute deep vein thrombosis involving the left popliteal and distal femoral veins.  - [facility]  #Chronic Renal Insufficiency  - Creat 2.3mg % - Nov 2014, 2.1mg % nin April 2015  - [facility] 44 [facility]  #obesity  - Body mass index is 28.4 kg/(m^2). on 07/17/2013: this is a result of aggressie weight loss program since fall 2014  - Body mass index is 29.43 kg/(m^2). on 03/11/2014      #REcurrent bronchitis/respiratory tract infection    #Chronic Pain ad chronic benzo  - fentanyl patch  #Hermansky-Pudlak Syndrome  #Inflammatory bowel disease; Chrons'    - s/p Total colectomy 2012   - on TNF [facility] - [facility]ugh [facility]: changed [facility] 2015 as part of lung transplant eval trial    # albinism,     -   #Pulmonary fibrosis/ILD - new since spring 2014   -  April 2014: FVC 4.30/74%, FEV1 3.45/77%, FEV1/FVC 0.80. TLC 92%, RV 121%, DLCO 40%.   -  CT 10/03/12 : IMPRESSION: Slightly upper lobe predominant peribronchovascular fibrosis, as  detailed above. This is new since 07/28/2008   - May 2014: 3L with exertion  - - Aug 2014: 6L with exertion. sTarted pirfenidone from [place], 200mg  x 4, TID 0> ch anged to 200mg  x 3, tid Sept 2015 due to renal insuff\   - Nov 2014: Tussionex for cough   -  [facility] with Dr Finis BudG [place] - unlikely tx candidate)   - [facility] [place] [facility]      PFT FVC fev1 ratio BD fev1 TLC DLCO Walk test RX  April 2014 4.3L/74% 3.45L/77% 0.8 x xL/92% x/40%    Aug 2014        Start pirfenex from [place]  Dec 2014 (post bd value) 4.26L/71%    5.36/71% 14.3/39%    April 2015       13685ft x 3 laps on 6L : did not desat   03/10/14 (post bd value) 4.09L/68% 3.15L/65% 0.77 x 5.07/67% 14.8/40%        .  OV 03/10/2014  Chief Complaint  Patient presents with  . Follow-up    Pt here after PFT. Pt states he has had a slight increase in dry cough. Pt denies CP/tightness.    [age] yea old male with complicated medical problems as above. Accompanied by wife and father. A Very remarkable young man who will never give up. I personally not seen him in many months. He has spetn the summer trying to get [facility] [place]. Reports that initially they were concerned about his ability to  Handle post tx immunomodulators esp  with IBD and simponi Rx. So, they hae him on empiric prograf instead of simponi (12 week Rx ended yesterday but he is continuing) to see if it will keep IBD in remission. Per patient it seems to be helping IBD but says his recent creat tested at [facility] (results NA to me) is high at 3mg %. So now they have rejected him for lung tx but are evaluting him for lung-kidney at same time. HE is pessmistic that he will be  Rejected. [facility]. He is continuing to push and advocate for his health care  In terms of his ILD: cough worse past few or several day but only mild and is part of cycle. His PFT show a 5% decline in TLC and 4% with FVC but unchanged DLCO. These might be clinically significant or not. Trend line not fully established; Atleast DLCO is unchaned. He still uses 6L [place]. HE has supply of 8 months of Pirfenex from [place]. HE  is still trying [place] get [place] pulmonary [place] in [place]. He wants refill on tussionex. Toelrating pirfenex well without sunburn or gi issues despite rise in creatinine. Explained that pirfenex levels go up in setting of renal insuff  Other issues  - wants vit d checked - uptodate with vaccines - wil have me refer him to [facility] if [place] option does not work for transplant    Review of Systems  Constitutional: Negative for fever and unexpected weight change.  HENT: Negative for congestion, dental problem, ear pain, nosebleeds, postnasal drip, rhinorrhea, sinus pressure, sneezing, sore throat and trouble swallowing.   Eyes: Negative for redness and itching.  Respiratory: Positive for cough and shortness of breath. Negative for chest tightness and wheezing.   Cardiovascular: Negative for palpitations and leg swelling.  Gastrointestinal: Negative for nausea and vomiting.  Genitourinary: Negative for dysuria.  Musculoskeletal: Negative for joint swelling.  Skin: Negative for rash.  Neurological: Negative for headaches.  Hematological: Does not bruise/bleed easily.  Psychiatric/Behavioral: Negative for dysphoric mood. The patient is not nervous/anxious.    Current outpatient prescriptions:apixaban (ELIQUIS) 5 MG TABS tablet, Take 5 mg by mouth 2 (two) times daily., Disp: , Rfl: ;  baclofen (LIORESAL) 20 MG tablet, Take 20 mg by mouth daily., Disp: , Rfl: ;  benzonatate (TESSALON) 100 MG capsule, TAKE 2 CAPSULES (200 MG TOTAL) BY MOUTH 3 (THREE) TIMES DAILY., Disp: 180 capsule, Rfl: 2 chlorpheniramine-HYDROcodone (TUSSIONEX PENNKINETIC ER) 10-8 MG/5ML LQCR, Take 5 mLs by mouth every 12 (twelve) hours as needed., Disp: 240 mL, Rfl: 0;  clobetasol ointment (TEMOVATE) 0.05 %, daily. , Disp: , Rfl: ;  esomeprazole (NEXIUM) 40 MG capsule, Take 40 mg by mouth daily before breakfast.  , Disp: , Rfl: ;  fentaNYL (DURAGESIC - DOSED MCG/HR) 12 MCG/HR, 1 patch every other day,  alternating with 25mg /ptch, Disp: , Rfl:  fentaNYL (DURAGESIC - DOSED MCG/HR) 25 MCG/HR patch, 1 patch every other day, alternating with 12mg /patch, Disp: , Rfl: ;  Fluticasone-Salmeterol (ADVAIR) 250-50 MCG/DOSE AEPB, Inhale 1 puff into the lungs every 12 (twelve) hours., Disp: , Rfl: ;  hydrocortisone (CORTEF) 5 MG tablet, Take 5 mg by mouth daily. 15 mg in AM and 5 mg in PM, Disp: , Rfl: ;  lidocaine (XYLOCAINE) 5 % ointment, Apply 1 application topically as needed., Disp: , Rfl:  mupirocin ointment (BACTROBAN) 2 %, Apply 1 application topically 2 (two) times daily., Disp: 22 g, Rfl: 3;  PIRFENIDONE  PO, 200mg , take 4 tablets three times a day, Disp: , Rfl: ;  sulfamethoxazole-trimethoprim (BACTRIM DS) 800-160 MG per tablet, Take 1 tablet by mouth every other day., Disp: , Rfl: ;  tacrolimus (PROGRAF) 1 MG capsule, Take 1 mg by mouth 2 (two) times daily. , Disp: , Rfl:  topiramate (TOPAMAX) 25 MG tablet, Take 25 mg by mouth 2 (two) times daily., Disp: , Rfl: ;  Vilazodone HCl (VIIBRYD) 40 MG TABS, Take 40 mg by mouth daily., Disp: , Rfl: ;  albuterol (PROAIR HFA) 108 (90 BASE) MCG/ACT inhaler, Inhale 2 puffs into the lungs 4 (four) times daily as needed for wheezing or shortness of breath., Disp: 1 Inhaler, Rfl: prn;  gabapentin (NEURONTIN) 300 MG capsule, Take 900 mg by mouth at bedtime. , Disp: , Rfl:  Vitamin D, Ergocalciferol, (DRISDOL) 50000 UNITS CAPS capsule, Take 1 capsule by mouth every 7 (seven) days., Disp: , Rfl:      Objective:   Physical Exam  Nursing note and vitals reviewed. Constitutional: He is oriented to person, place, and time. He appears well-developed and well-nourished. No distress.  albino  HENT:  Head: Normocephalic and atraumatic.  Right Ear: External ear normal.  Left Ear: External ear normal.  Mouth/Throat: Oropharynx is clear and moist. No oropharyngeal exudate.  o2 on  Eyes: Conjunctivae and EOM are normal. Pupils are equal, round, and reactive to light. Right eye  exhibits no discharge. Left eye exhibits no discharge. No scleral icterus.  Neck: Normal range of motion. Neck supple. No JVD present. No tracheal deviation present. No thyromegaly present.  Cardiovascular: Normal rate, regular rhythm and intact distal pulses.  Exam reveals no gallop and no friction rub.   No murmur heard. Pulmonary/Chest: Effort normal and breath sounds normal. No respiratory distress. He has no wheezes. He has no rales. He exhibits no tenderness.  occ scattered crackles + - baseline  Abdominal: Soft. Bowel sounds are normal. He exhibits no distension and no mass. There is no tenderness. There is no rebound and no guarding.  Musculoskeletal: Normal range of motion. He exhibits no edema and no tenderness.  Lymphadenopathy:    He has no cervical adenopathy.  Neurological: He is alert and oriented to person, place, and time. He has normal reflexes. No cranial nerve deficit. Coordination normal.  Skin: Skin is warm and dry. No rash noted. He is not diaphoretic. No erythema. No pallor.  Psychiatric: He has a normal mood and affect. His behavior is normal. Judgment and thought content normal.    Filed Vitals:   03/10/14 1625  BP: 102/82  Pulse: 118  Height: 6\' 1"  (1.854 m)  Weight: 223 lb (101.152 kg)  SpO2: 98%         Assessment & Plan:  #ILD  - mild to no decline in lung functiin since dec 2014  - continue o2  - continue pirfenex 200mg  tablet but reduce to 3tab , 3 times a day with meals (reduction due to renal issues)  - keep up the hard work on transplant front  - glad uptodate with vaccines  -refill tussionex for cough   #Vit D Def  - recheck levels today  - samples I have given you are high dose, so be careful about taking it without level monitoring   #FOllowup  3 months with Dr Marchelle Gearing   > 50% of this > 25 min visit spent in face to face counseling (15 min visit converted to 25 min)   Dr. Kalman Shan, M.D., [facility].C.P  Pulmonary and Critical  Care Medicine Staff Physician  System [place] [facility] Pager: [phone], If no answer or between  15:00h - 7:00h: call [phone]  03/11/2014 9:49 AM

## 2014-03-11 ENCOUNTER — Telehealth: Payer: Self-pay | Admitting: Internal Medicine

## 2014-03-11 LAB — BASIC METABOLIC PANEL
BUN: 32 mg/dL — AB (ref 6–23)
CO2: 20 mEq/L (ref 19–32)
Calcium: 9 mg/dL (ref 8.4–10.5)
Chloride: 111 mEq/L (ref 96–112)
Creatinine, Ser: 3 mg/dL — ABNORMAL HIGH (ref 0.4–1.5)
GFR: 26.21 mL/min — AB (ref >60.00)
Glucose, Bld: 90 mg/dL (ref 70–99)
Potassium: 4.2 mEq/L (ref 3.5–5.1)
SODIUM: 140 meq/L (ref 135–145)

## 2014-03-11 LAB — HEPATIC FUNCTION PANEL
ALK PHOS: 74 U/L (ref 39–117)
ALT: 18 U/L (ref 0–53)
AST: 26 U/L (ref 0–37)
Albumin: 3.9 g/dL (ref 3.5–5.2)
BILIRUBIN DIRECT: 0 mg/dL (ref 0.0–0.3)
Total Bilirubin: 0.3 mg/dL (ref 0.2–1.2)
Total Protein: 8.7 g/dL — ABNORMAL HIGH (ref 6.0–8.3)

## 2014-03-11 LAB — VITAMIN D 25 HYDROXY (VIT D DEFICIENCY, FRACTURES): VITD: 28.1 ng/mL — ABNORMAL LOW (ref 30.00–100.00)

## 2014-03-11 NOTE — Telephone Encounter (Signed)
Jeremy RuizElise  Rodriquez [name] has an extensive care team. Please send my note to all at different insituutions. They all need to be in epic so next time is easy to send my note  Thanks  Dr. Kalman ShanMurali Sharnae Winfree, M.D., [facility]C.P Pulmonary and Critical Care Medicine Staff Physician [place] [facility] Pager: [phone], If no answer or between  15:00h - 7:00h: call [phone]  03/11/2014 9:50 AM

## 2014-03-17 ENCOUNTER — Encounter (HOSPITAL_COMMUNITY): Admission: RE | Admit: 2014-03-17 | Payer: 59 | Source: Ambulatory Visit

## 2014-03-17 ENCOUNTER — Telehealth (HOSPITAL_COMMUNITY): Payer: Self-pay

## 2014-03-17 NOTE — Telephone Encounter (Signed)
Called patient to inquire about extended absence from rehab.  Left message on voicemail.

## 2014-03-18 NOTE — Telephone Encounter (Signed)
lmtcb for pt.  

## 2014-03-19 ENCOUNTER — Encounter (HOSPITAL_COMMUNITY)
Admission: RE | Admit: 2014-03-19 | Discharge: 2014-03-19 | Disposition: A | Discharge status: Home / Self Care | Payer: Self-pay | Source: Ambulatory Visit | Attending: Internal Medicine | Admitting: Internal Medicine

## 2014-03-19 DIAGNOSIS — Z86718 Personal history of other venous thrombosis and embolism: Secondary | ICD-10-CM | POA: Insufficient documentation

## 2014-03-19 DIAGNOSIS — Z79899 Other long term (current) drug therapy: Secondary | ICD-10-CM | POA: Insufficient documentation

## 2014-03-19 DIAGNOSIS — J45909 Unspecified asthma, uncomplicated: Secondary | ICD-10-CM | POA: Insufficient documentation

## 2014-03-19 DIAGNOSIS — Z5189 Encounter for other specified aftercare: Secondary | ICD-10-CM | POA: Insufficient documentation

## 2014-03-24 ENCOUNTER — Encounter (HOSPITAL_COMMUNITY)
Admission: RE | Admit: 2014-03-24 | Discharge: 2014-03-24 | Disposition: A | Discharge status: Home / Self Care | Payer: Self-pay | Source: Ambulatory Visit | Attending: Internal Medicine | Admitting: Internal Medicine

## 2014-03-25 NOTE — Progress Notes (Signed)
Quick Note:  lmtcb ______ 

## 2014-03-26 ENCOUNTER — Encounter (HOSPITAL_COMMUNITY): Payer: 59

## 2014-03-26 NOTE — Progress Notes (Signed)
Quick Note:  lmtcb x 2 ______ 

## 2014-03-27 NOTE — Telephone Encounter (Signed)
Pt said if he didn't answer, just to leave him a message.Caren GriffinsStanley A Dalton

## 2014-03-31 ENCOUNTER — Encounter (HOSPITAL_COMMUNITY): Payer: 59

## 2014-04-02 ENCOUNTER — Encounter (HOSPITAL_COMMUNITY)
Admission: RE | Admit: 2014-04-02 | Discharge: 2014-04-02 | Disposition: A | Discharge status: Home / Self Care | Payer: Self-pay | Source: Ambulatory Visit | Attending: Internal Medicine | Admitting: Internal Medicine

## 2014-04-07 ENCOUNTER — Encounter (HOSPITAL_COMMUNITY)
Admission: RE | Admit: 2014-04-07 | Discharge: 2014-04-07 | Disposition: A | Discharge status: Home / Self Care | Payer: Self-pay | Source: Ambulatory Visit | Attending: Internal Medicine | Admitting: Internal Medicine

## 2014-04-09 ENCOUNTER — Encounter (HOSPITAL_COMMUNITY): Payer: 59

## 2014-04-13 ENCOUNTER — Telehealth: Payer: Self-pay | Admitting: Internal Medicine

## 2014-04-13 NOTE — Telephone Encounter (Signed)
Pt last had refill tussionex 03/10/14 #300 ml Pt wants this mailed to him. Please advise MR thanks

## 2014-04-14 ENCOUNTER — Encounter (HOSPITAL_COMMUNITY): Payer: 59 | Attending: Internal Medicine

## 2014-04-14 DIAGNOSIS — Z79899 Other long term (current) drug therapy: Secondary | ICD-10-CM | POA: Insufficient documentation

## 2014-04-14 DIAGNOSIS — J45909 Unspecified asthma, uncomplicated: Secondary | ICD-10-CM | POA: Insufficient documentation

## 2014-04-14 DIAGNOSIS — Z86718 Personal history of other venous thrombosis and embolism: Secondary | ICD-10-CM | POA: Insufficient documentation

## 2014-04-14 DIAGNOSIS — Z5189 Encounter for other specified aftercare: Secondary | ICD-10-CM | POA: Insufficient documentation

## 2014-04-14 MED ORDER — HYDROCOD POLST-CHLORPHEN POLST 10-8 MG/5ML PO LQCR: 5.0000 mL | Freq: Two times a day (BID) | ORAL | Status: DC | PRN

## 2014-04-14 NOTE — Telephone Encounter (Signed)
LMTCB- left detailed message per pt's request. Informed pt on the message that he will need to call back to provide the information (phone, fax, name and institution) of all the different providers and institutions the pt is involved with so I can send MR's notes. Will await call.

## 2014-04-14 NOTE — Telephone Encounter (Signed)
Spoke with pt and advised that MR is ok to refill Tussionex for 3 separate months.  MR not back in office until 04/20/14.  Pt states he will be out before then.  Will ask doc of day if he will sign rx for Tussionex to send. Rx printed and signed by Dr Kriste BasqueNadel and placed in mail per pt request.  Other 2 post dated rx for Tussionex printed and placed for MR to sign and then will mail to pt.

## 2014-04-14 NOTE — Telephone Encounter (Signed)
That is fine. You can do 3 months with post date. Also, let him know that I am likely going to [place] next week and if he wants me to pick up ind[place] pirfenidone (called pirfenex) for him. ?  Thanks  Dr. Kalman ShanMurali Reiana Poteet, M.D., [facility].C.P Pulmonary and Critical Care Medicine Staff Physician [facility] [place] [facility] Pager: [phone], If no answer or between  15:00h - 7:00h: call [phone]  04/14/2014 8:51 AM

## 2014-04-16 ENCOUNTER — Encounter (HOSPITAL_COMMUNITY): Payer: 59

## 2014-04-20 NOTE — Telephone Encounter (Signed)
LMTCB for pt 

## 2014-04-21 ENCOUNTER — Telehealth (HOSPITAL_COMMUNITY): Payer: Self-pay | Admitting: *Deleted

## 2014-04-21 ENCOUNTER — Encounter (HOSPITAL_COMMUNITY): Payer: 59

## 2014-04-21 NOTE — Telephone Encounter (Signed)
Rx signed by MR and placed in outgoing mail. Nothing further needed.

## 2014-04-23 ENCOUNTER — Encounter (HOSPITAL_COMMUNITY): Payer: 59

## 2014-04-23 ENCOUNTER — Encounter: Payer: Self-pay | Admitting: Emergency Medicine

## 2014-04-23 NOTE — Telephone Encounter (Signed)
ATC pt x 2-- "all circuits are busy, please try your call again later."   Letter sent for pt to call placed in outgoing mail. Will sign off.

## 2014-04-28 ENCOUNTER — Encounter (HOSPITAL_COMMUNITY): Payer: 59

## 2014-04-30 ENCOUNTER — Encounter (HOSPITAL_COMMUNITY): Payer: 59

## 2014-04-30 ENCOUNTER — Telehealth: Payer: Self-pay | Admitting: Internal Medicine

## 2014-04-30 NOTE — Telephone Encounter (Signed)
I can see him 05/04/14 momnday add on for morning. IF cannot wait, see Tammy Parrett or Clnton young in interim - both know him well  Thanks  Dr. Kalman ShanMurali Jude Linck, M.D., [facility] Pulmonary and Critical Care Medicine Staff Physician [facility] [place] [facility] and Critical Care Pager: [phone], If no answer or between  15:00h - 7:00h: call [phone]  04/30/2014 11:28 AM

## 2014-04-30 NOTE — Telephone Encounter (Signed)
lmtcb x1 Needs 30 min OV with MR (05/04/14 if can wait)

## 2014-04-30 NOTE — Telephone Encounter (Signed)
appt set for tomorrow with TP at 10:30am.[name], CMA

## 2014-04-30 NOTE — Telephone Encounter (Signed)
lmtcb x 1 (detailed)  Will forward to [place] per her request as she is trying to open this day for appts per MR

## 2014-04-30 NOTE — Telephone Encounter (Signed)
Pt states that he traveled out of state to [place] for a conference and feels like he has caught a virus Pt c/o "spasmatic" coughing - incr more than normal Pt feels that the O2 concentrator he was traveling with was not putting out adequate amounts of O2 (too much or not enough O2) -- [facility] Pt reports falling asleep standing up and was overly tired and unable to stay alert, moments of unconsciousness-pt reports trying to change the battery and he would "black out/fall asleep" doing so and wife would wake him up. Pt states that his cognitive abilities were absent during the latter part of the trip. Pt states that since being back on his home O2, he is improving but is not 100%. Cough is still severe. Pt reports choking and throwing up clear liquid at times. Requests rec's and OV with MR.

## 2014-04-30 NOTE — Telephone Encounter (Signed)
Pt returned call - [phone]

## 2014-05-01 ENCOUNTER — Other Ambulatory Visit: Payer: Self-pay | Admitting: Adult Health

## 2014-05-01 ENCOUNTER — Encounter: Payer: Self-pay | Admitting: Adult Health

## 2014-05-01 ENCOUNTER — Ambulatory Visit (INDEPENDENT_AMBULATORY_CARE_PROVIDER_SITE_OTHER)
Admission: RE | Admit: 2014-05-01 | Discharge: 2014-05-01 | Disposition: A | Discharge status: Home / Self Care | Payer: Self-pay | Source: Ambulatory Visit | Attending: Adult Health | Admitting: Adult Health

## 2014-05-01 ENCOUNTER — Ambulatory Visit (INDEPENDENT_AMBULATORY_CARE_PROVIDER_SITE_OTHER): Payer: 59 | Admitting: Adult Health

## 2014-05-01 VITALS — BP 104/74 | HR 107 | Temp 97.0°F | Ht 73.0 in | Wt 226.0 lb

## 2014-05-01 DIAGNOSIS — J849 Interstitial pulmonary disease, unspecified: Secondary | ICD-10-CM

## 2014-05-01 DIAGNOSIS — J069 Acute upper respiratory infection, unspecified: Secondary | ICD-10-CM

## 2014-05-01 MED ORDER — AMOXICILLIN-POT CLAVULANATE 875-125 MG PO TABS: 1.0000 | ORAL_TABLET | Freq: Two times a day (BID) | ORAL | Status: DC

## 2014-05-01 MED ORDER — BENZONATATE 200 MG PO CAPS: 200.0000 mg | ORAL_CAPSULE | Freq: Three times a day (TID) | ORAL | Status: AC | PRN

## 2014-05-01 NOTE — Progress Notes (Signed)
Subjective:    Patient ID: [name], male    DOB: 1980-07-21, 33 y.o.   MRN: [id number]  HPI    PCP [name], MD Heme-ONc - [name] - [name] + [facility] (Was followed by Dr [name] through Sept 2014 and then switched to Dr Elly Modenaamswamy) Renal:  DR [name] your nephrologist  PROB LIST  # asthma, allergic rhinitis NOS  # recurrent respiratory infections NOS  #Venous Thrombo-Embolism- Recurrent D[facility]  - ? First episode after colectomy in 2012  - Doppler- Had L popliteal vein DVT on 07/30/12  - 02/26/13  = POSITIVE DVT: acute deep vein thrombosis involving the left popliteal and distal femoral veins.  - [facility]Nov 2014 [facility]  #Chronic Renal Insufficiency  - Creat 2.3mg % - Nov 2014, 2.1mg % nin April 2015  - GFR 44 [facility]  #obesity  - Body mass index is 28.4 kg/(m^2). on 07/17/2013: this is a result of aggressie weight loss program since fall 2014  - Body mass index is 29.43 kg/(m^2). on 03/11/2014      #REcurrent bronchitis/respiratory tract infection    #Chronic Pain ad chronic benzo  - fentanyl patch  #Hermansky-Pudlak Syndrome  #Inflammatory bowel disease; Chrons'    - s/p Total colectomy 2012   - on TNF alpha - [facility]oni [facility]: changed to empiric [facility]f [facility] - summer 2015 as part of lung transplant eval trial    # albinism,     -   #Pulmonary fibrosis/ILD - new since spring 2014   -  April 2014: FVC 4.30/74%, FEV1 3.45/77%, FEV1/FVC 0.80. TLC 92%, RV 121%, DLCO 40%.   -  CT 10/03/12 : IMPRESSION: Slightly upper lobe predominant peribronchovascular fibrosis, as  detailed above. This is new since 07/28/2008   - May 2014: 3L with exertion  - - Aug 2014: 6L with exertion. sTarted pirfenidone from [place], 200mg  x 4, TID 0> ch anged to 200mg  x 3, tid Sept 2015 due to renal insuff\   - Nov 2014: Tussionex for cough   -  [facility]jen[facility] kidney [facility] phone call with Dr Finis BudG [place] - unlikely tx candidate)   - [facility]      PFT FVC fev1 ratio BD fev1 TLC DLCO Walk test RX  April 2014 4.3L/74% 3.45L/77% 0.8 x xL/92% x/40%    Aug 2014        Start pirfenex from [place]  Dec 2014 (post bd value) 4.26L/71%    5.36/71% 14.3/39%    April 2015       13685ft x 3 laps on 6L : did not desat   03/10/14 (post bd value) 4.09L/68% 3.15L/65% 0.77 x 5.07/67% 14.8/40%        .  OV 03/10/2014  Chief Complaint  Patient presents with  . Follow-up    Pt here after PFT. Pt states he has had a slight increase in dry cough. Pt denies CP/tightness.    [age] yea old male with complicated medical problems as above. Accompanied by wife and father. A Very remarkable young man who will never give up. I personally not seen him in many months. He has spetn the summer trying [facility] [place]. Reports that initially they were concerned about his ability to  Handle post tx immunomodulators esp  with IBD and simponi Rx. So, they hae him on empiric prograf instead of simponi (12 week Rx ended yesterday but he is continuing) to see if it will keep IBD in remission. Per patient it seems to be helping IBD but says his recent creat tested at [facility] (results NA to me) is high at 3mg %. So now they have rejected him for lung tx but are evaluting him for lung-kidney at same time. HE is pessmistic that he will be  Rejected. Their fall back option os [address] and [employer]. He is continuing to push and advocate for his health care  In terms of his ILD: cough worse past few or several day but only mild and is part of cycle. His PFT show a 5% decline in TLC and 4% with FVC but unchanged DLCO. These might be clinically significant or not. Trend line not fully established; Atleast DLCO is unchaned. He still uses 6L [place]. HE has supply of 8 months of Pirfenex from [place]. HE  is still trying to get a pulmonary doc in [place] to write him [place] esbriet. He wants refill on tussionex. Toelrating pirfenex well without sunburn or gi issues despite rise in creatinine. Explained that pirfenex levels go up in setting of renal insuff  Other issues  - wants vit d checked - uptodate with vaccines - wil have me refer him to [facility] if [place] option does not work for transplant   05/01/2014 Acute OV  Presents for increased cough over last 3 days.  Mainly dry in nature , no discolored mucus.  Has returned from [facility].  Denies f/c/s, n/v/d, wheezing, tightness, purulent sputum.  Request cxr today .   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  He says this is from his hx of colitis (s/p colectomy 2012)  Says this is a concern if he gets transplant with immunosuppression .  Now on prednisone 60mg  (plan for taper over 6 weeks)  Also on prograf as well.   Says he has to move to [place] to get listed on the Transplant list. Has not been officially accepted into the program. Plans for Kidney /Lung transplant.  The hold up for now is the Pyoderma get totally resolved before he will be accepted.   Remains on Pirfenidone for pulmonary fibrosis    He denies any fever, chest pain, orthopnea, PND, or increased leg swelling      Review of Systems  Constitutional:   No  weight loss, night sweats,  Fevers, chills,  +fatigue, or  lassitude.  HEENT:   No headaches,  Difficulty swallowing,  Tooth/dental problems, or  Sore throat,                No sneezing, itching, ear ache, nasal congestion, post nasal drip,   CV:  No chest pain,  Orthopnea, PND, swelling in lower extremities, anasarca, dizziness, palpitations, syncope.   GI  No heartburn, indigestion, abdominal pain, nausea, vomiting, diarrhea, change in bowel habits, loss of appetite, bloody stools.   Resp:  Skin: no rash or lesions.  GU: no dysuria, change in color  of urine, no urgency or frequency.  No flank pain, no hematuria   MS:  No joint pain or swelling.  No decreased range of motion.  No back pain.  Psych:  No change in mood or affect. No depression or anxiety.  No memory loss.         Objective:  Physical Exam  GEN: A/Ox3; pleasant , NAD, chronically ill appearing on O2  [place]   HEENT:  [place]/AT,  EACs-clear, TMs-wnl, NOSE-clear, THROAT-clear, no lesions, no postnasal drip or exudate noted.   NECK:  Supple w/ fair ROM; no JVD; normal carotid impulses w/o bruits; no thyromegaly or nodules palpated; no lymphadenopathy.  RESP  Faint bibasilar crackles no accessory muscle use, no dullness to percussion  CARD:  RRR, no m/r/g  , no peripheral edema, pulses intact, no cyanosis or clubbing.  GI:   Soft & nt; nml bowel sounds; no organomegaly or masses detected.  Musco: Warm bil, no deformities or joint swelling noted.   Neuro: alert, no focal deficits noted.    Skin: Warm, no lesions or rashes         Assessment & Plan:

## 2014-05-01 NOTE — Patient Instructions (Signed)
Delsym 2 tsp Twice daily  As needed cough  Tessalon Three times a day  As needed  Cough  Chest xray today .  Fluids and rest  Saline nasal rinses As needed   Please contact office for sooner follow up if symptoms do not improve or worsen or seek emergency care  Follow up Dr. [name] as planned and As needed

## 2014-05-01 NOTE — Progress Notes (Signed)
Quick Note:  Patient returned call. MR not in the office December 2015, appt scheduled with TP 12.14.15 w/ repeat cxr. First available scheduled with MR 1.8.16. Pt aware to contact the office for sooner follow up if needed for worsening symptoms. Augmentin already sent to pharmacy. ______

## 2014-05-04 ENCOUNTER — Telehealth: Payer: Self-pay | Admitting: Internal Medicine

## 2014-05-04 MED ORDER — CEPHALEXIN 500 MG PO CAPS: 500.0000 mg | ORAL_CAPSULE | Freq: Two times a day (BID) | ORAL | Status: DC

## 2014-05-04 NOTE — Assessment & Plan Note (Signed)
Suspect URI with underlying pulmonary fibrosis  He is immunosupressed , check cxr today   Plan  Delsym 2 tsp Twice daily  As needed cough  Tessalon Three times a day  As needed  Cough  Chest xray today .  Fluids and rest  Saline nasal rinses As needed   Please contact office for sooner follow up if symptoms do not improve or worsen or seek emergency care  Follow up Dr. [name] as planned and As needed     Late add : spoke with pt on 05/01/14  CXR with ? Early PNA with LUL increaesd markings  Augmentin x 10 d sent to pharm  cxr in 2-3 weeks  Please contact office for sooner follow up if symptoms do not improve or worsen or seek emergency care

## 2014-05-04 NOTE — Telephone Encounter (Signed)
PT seen [name] on 05/01/14 and was dx with ? Early pneumonia and started on Augmentin.  Pt states he is not feelign much better.  C/o some mild blood tinnged sputum this weekend and oxygen dropped to 80"s on 4L and had to increased to 6L to bring sats up to 94%.  Also has some concerns about being on Pred 60 mg/day from another physician.  Wants to know if MR thinks he should start another abx with the Augmentin to make sure this is taken care of.  Pt is very concerned due to his lung condition.  Please advise.

## 2014-05-04 NOTE — Telephone Encounter (Signed)
Forgot he is on prograf.  He can try cephalexin 500mg  bid x 7 days  Thanks  Dr. Kalman ShanMurali Humna Moorehouse, M.D., [facility].P Pulmonary and Critical Care Medicine Staff Physician [place] [facility] [place] Pulmonary and Critical Care Pager: [phone], If no answer or between  15:00h - 7:00h: call [phone]  05/04/2014 4:46 PM

## 2014-05-04 NOTE — Telephone Encounter (Signed)
Levaquin 500mg daily x 7 days

## 2014-05-04 NOTE — Telephone Encounter (Signed)
Called pt and is aware of recs. RX sent in. Nothing further needed 

## 2014-05-04 NOTE — Telephone Encounter (Signed)
The levaquin interacts with pt's prograf causing prolonged QT and could be life threatening. This is why Tammy chose augmentin. Pt wants me to send this back to MR. pleaase advise thanks

## 2014-05-04 NOTE — Telephone Encounter (Signed)
Called and spoke to pt. Pt stated he is taking the anticoag correctly and is taking pred taper starting with 60mg  for 3 weeks. Pt is requesting to change augment to Levaquin-rx sent to preferred pharmacy.   Pt stated because of the holiday and the office closed certain days, he will call on Wednesday with an update.   MR please advise on duration of Levaquin.

## 2014-05-04 NOTE — Telephone Encounter (Signed)
Is he taking his anticoagulation correctly?   Augmenting is very strong. He could try levaquin 500mg  daily for same duration Tammy gave instead. IF problem persists, worsens: He needs to get admitted via ER or direct admit  For now continue same prednisone dosage - how long is he supposed to take prednisone 60mg  daily?  Thanks  Dr. Kalman ShanMurali Alicya Bena, M.D., [facility].P Pulmonary and Critical Care Medicine Staff Physician [facility] [place] [facility]monary and Critical Care Pager: [phone], If no answer or between  15:00h - 7:00h: call [phone]  05/04/2014 3:48 PM

## 2014-05-05 ENCOUNTER — Encounter (HOSPITAL_COMMUNITY): Payer: 59

## 2014-05-05 ENCOUNTER — Telehealth: Payer: Self-pay | Admitting: Internal Medicine

## 2014-05-05 NOTE — Telephone Encounter (Signed)
Spoke with pt, wanting to know who will be over his care while Dr [name] is out of office. Advised the patient that Dr [name] will be in the hospital working most of the month of Dec. Few vacations/off days here and there. Pt also advised that TP will see him for anything that he needs while MR out of office. Pt states that he does not want to see TP if at all possible d/t having multiple issues and MR knowing his case. Pt states that he will let us know if anything needed from us.  Will send to MR as [facility]

## 2014-05-07 ENCOUNTER — Encounter (HOSPITAL_COMMUNITY): Payer: 59

## 2014-05-12 ENCOUNTER — Encounter (HOSPITAL_COMMUNITY): Payer: 59 | Attending: Internal Medicine

## 2014-05-12 DIAGNOSIS — Z5189 Encounter for other specified aftercare: Secondary | ICD-10-CM | POA: Insufficient documentation

## 2014-05-12 DIAGNOSIS — Z79899 Other long term (current) drug therapy: Secondary | ICD-10-CM | POA: Diagnosis not present

## 2014-05-12 DIAGNOSIS — J45909 Unspecified asthma, uncomplicated: Secondary | ICD-10-CM | POA: Diagnosis not present

## 2014-05-12 DIAGNOSIS — Z86718 Personal history of other venous thrombosis and embolism: Secondary | ICD-10-CM | POA: Diagnosis not present

## 2014-05-14 ENCOUNTER — Encounter (HOSPITAL_COMMUNITY): Payer: 59

## 2014-05-19 ENCOUNTER — Encounter (HOSPITAL_COMMUNITY): Payer: 59

## 2014-05-19 NOTE — Telephone Encounter (Signed)
Left detailed message of MR's recs and to call back if anything else is needed.  Nothing further needed.

## 2014-05-19 NOTE — Telephone Encounter (Signed)
Let him know that Dr [name] in addition can always provide backup because he knows him as well

## 2014-05-21 ENCOUNTER — Encounter (HOSPITAL_COMMUNITY): Payer: 59

## 2014-05-25 ENCOUNTER — Ambulatory Visit: Payer: Self-pay | Admitting: Internal Medicine

## 2014-05-25 ENCOUNTER — Ambulatory Visit: Payer: Self-pay | Admitting: Pulmonary Disease

## 2014-05-26 ENCOUNTER — Encounter (HOSPITAL_COMMUNITY): Payer: 59

## 2014-05-28 ENCOUNTER — Encounter (HOSPITAL_COMMUNITY): Payer: 59

## 2014-06-02 ENCOUNTER — Encounter (HOSPITAL_COMMUNITY): Payer: 59

## 2014-06-04 ENCOUNTER — Encounter (HOSPITAL_COMMUNITY): Payer: 59

## 2014-06-09 ENCOUNTER — Encounter (HOSPITAL_COMMUNITY): Payer: 59

## 2014-06-11 ENCOUNTER — Encounter (HOSPITAL_COMMUNITY): Payer: 59

## 2014-06-16 ENCOUNTER — Encounter (HOSPITAL_COMMUNITY): Payer: 59 | Attending: Internal Medicine

## 2014-06-16 DIAGNOSIS — Z79899 Other long term (current) drug therapy: Secondary | ICD-10-CM | POA: Insufficient documentation

## 2014-06-16 DIAGNOSIS — J45909 Unspecified asthma, uncomplicated: Secondary | ICD-10-CM | POA: Insufficient documentation

## 2014-06-16 DIAGNOSIS — Z86718 Personal history of other venous thrombosis and embolism: Secondary | ICD-10-CM | POA: Diagnosis not present

## 2014-06-16 DIAGNOSIS — Z5189 Encounter for other specified aftercare: Secondary | ICD-10-CM | POA: Insufficient documentation

## 2014-06-18 ENCOUNTER — Encounter (HOSPITAL_COMMUNITY): Payer: 59

## 2014-06-19 ENCOUNTER — Encounter: Payer: Self-pay | Admitting: Internal Medicine

## 2014-06-19 ENCOUNTER — Ambulatory Visit (INDEPENDENT_AMBULATORY_CARE_PROVIDER_SITE_OTHER): Payer: 59 | Admitting: Internal Medicine

## 2014-06-19 VITALS — BP 102/78 | HR 115 | Ht 73.0 in | Wt 231.0 lb

## 2014-06-19 DIAGNOSIS — J849 Interstitial pulmonary disease, unspecified: Secondary | ICD-10-CM | POA: Diagnosis not present

## 2014-06-19 DIAGNOSIS — J9611 Chronic respiratory failure with hypoxia: Secondary | ICD-10-CM | POA: Diagnosis not present

## 2014-06-19 MED ORDER — HYDROCOD POLST-CHLORPHEN POLST 10-8 MG/5ML PO LQCR: 5.0000 mL | Freq: Two times a day (BID) | ORAL | Status: DC | PRN

## 2014-06-19 MED ORDER — HYDROCOD POLST-CHLORPHEN POLST 10-8 MG/5ML PO LQCR: 5.0000 mL | Freq: Two times a day (BID) | ORAL | Status: AC | PRN

## 2014-06-19 MED ORDER — CEFDINIR 300 MG PO CAPS: 300.0000 mg | ORAL_CAPSULE | Freq: Two times a day (BID) | ORAL | Status: DC

## 2014-06-19 NOTE — Patient Instructions (Addendum)
Possible PFT is worse Let us treat for an acute infection with  -  take omnicef 300mg  po twice daily x 5 days Do full PFT next week at /[place]  - call after done so we can give you results Refill tessalon and tussionex for cough  - will avoid increased prednisone, lyrica for the same due to transplant need Pred[facility]one [facility] Continue Pirfenex as before   Followup  2 months or sooner if needed; LFT at followup

## 2014-06-19 NOTE — Progress Notes (Signed)
Subjective:    Patient ID: [name], male    DOB: December 26, 1980, 34 y.o.   MRN: [id number]  HPI    PCP Sanda Linger, MD Heme-ONc - [name] - [facility] + [facility] (Was followed by Dr Maple Hudson through Sept 2014 and then switched to Dr Elly Modena) Renal:  DR Gwenith Spitz your nephrologist  PROB LIST  # asthma, allergic rhinitis NOS  # recurrent respiratory infections NOS  #Venous Thrombo-Embolism- Recurrent DVT: Hematologist at [facility]  - ? First episode after colectomy in 2012  - Doppler- Had L popliteal vein DVT on 07/30/12  - 02/26/13  = POSITIVE DVT: acute deep vein thrombosis involving the left popliteal and distal femoral veins.  - Rx coumadin changed to Eliquis oct/Nov 2014 at [facility]  #Chronic Renal Insufficiency  - Creat 2.3mg % - Nov 2014, 2.1mg % nin April 2015 -  - GFR 44 per patient Jan 2015 at [facility] Nculear scan study  - worsening creatinine to 3mg % 03/10/14  #obesity  - Body mass index is 28.4 kg/(m^2). on 07/17/2013: this is a result of aggressie weight loss program since fall 2014  - Body mass index is 29.43 kg/(m^2). on 03/11/2014  = Body mass index is 30.48 kg/(m^2).  06/19/2014      #REcurrent bronchitis/respiratory tract infection    #Chronic Pain ad chronic benzo  - fentanyl patch  #Hermansky-Pudlak Syndrome  #Inflammatory bowel disease; Chrons'    - s/p Total colectomy 2012   - on TNF alpha - simponi throiugh [facility]: changed to empiric Prograf by [facility] Session - summer 2015 as part of lung transplant eval trial    # albinism,     -   #Pulmonary fibrosis/ILD - new since spring 2014   -  April 2014: FVC 4.30/74%, FEV1 3.45/77%, FEV1/FVC 0.80. TLC 92%, RV 121%, DLCO 40%.   -  CT 10/03/12 : IMPRESSION: Slightly upper lobe predominant peribronchovascular fibrosis, as  detailed above. This is new since 07/28/2008   - May 2014: 3L with exertion  - - Aug 2014: 6L with exertion. sTarted pirfenidone from [place], 200mg  x 4, TID 0> ch anged to  200mg  x 3, tid Sept 2015 due to renal insuff\   - Nov 2014: Tussionex for cough   - Rjenected for lung and kidney transplant at [facility] 2015 [facility] phone call with Dr Finis Bud - unlikely tx candidate)   - Undergoing Tx eval at [facility] - summer 2015      PFT FVC fev1 ratio BD fev1 TLC DLCO Walk test RX  April 2014 4.3L/74% 3.45L/77% 0.8 x xL/92% x/40%    Aug 2014        Start pirfenex from [place]  Dec 2014 (post bd value) 4.26L/71%    5.36/71% 14.3/39%    April 2015       123ft x 3 laps on 6L : did not desat   03/10/14 (post bd value) 4.09L/68% 3.15L/65% 0.77 x 5.07/67% 14.8/40%    06/19/2014 (office spiro, pre BD) 3.65L/63% 2.58L/55% 0.71 x          .  OV 03/10/2014  Chief Complaint  Patient presents with  . Follow-up    Pt here after PFT. Pt states he has had a slight increase in dry cough. Pt denies CP/tightness.    33 yea old male with complicated medical problems as above. Accompanied by wife and father. A Very remarkable young man who will never give up. I personally not seen him in  many months. He has spetn the summer trying to get a lung tx [facility]. Reports that initially they were concerned about his ability to  Handle post tx immunomodulators esp with IBD and simponi Rx. So, they hae him on empiric prograf instead of simponi (12 week Rx ended yesterday but he is continuing) to see if it will keep IBD in remission. Per patient it seems to be helping IBD but says his recent creat tested at [facility] (results NA to me) is high at 3mg %. So now they have rejected him for lung tx but are evaluting him for lung-kidney at same time. HE is pessmistic that he will be  Rejected. [facility]. He is continuing to push and advocate for his health care  In terms of his ILD: cough worse past few or several day but only mild and is part of cycle. His PFT show a 5% decline in TLC and 4% with FVC but unchanged DLCO. These might be clinically  significant or not. Trend line not fully established; Atleast DLCO is unchaned. He still uses 6L [place]. HE has supply of 8 months of Pirfenex from [place]. HE is still trying to get a pulmonary doc in [place] es[place]. He wants refill on tussionex. Toelrating pirfenex well without sunburn or gi issues despite rise in creatinine. Explained that pirfenex levels go up in setting of renal insuff  Other issues  - wants vit d checked - uptodate with vaccines - wil have me refer [facility] if [place] option does not work for transplant   05/01/2014 Acute OV  Presents for increased cough over last 3 days.  Mainly dry in nature , no discolored mucus.  [facility].  Denies f/c/s, n/v/d, wheezing, tightness, purulent sputum.  Request cxr today .   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  He says this is from his hx of colitis (s/p colectomy 2012)  Says this is a concern if he gets transplant with immunosuppression .  Now on prednisone 60mg  (plan for taper over 6 weeks)  Also on prograf as well.   S[place] Transplant list. Has not been officially accepted into the program. Plans for Kidney /Lung transplant.  The hold up for now is the Pyoderma get totally resolved before he will be accepted.   Remains on Pirfenidone for pulmonary fibrosis    He denies any fever, chest pain, orthopnea, PND, or increased leg swelling   OV 06/19/2014  Chief Complaint  Patient presents with  . Follow-up    Pt stated he is tapering pred down to 10mg  per day. Pt stated he feels his breathing has worsened recently. Pt c/o increase in SOB, prod cough with clear mucus and chest discomfort.     Follow-up interstitial lung disease [facility] P[facility] syndrome with associated chronic respiratory failure 6 L oxygen  Last seen by me in September 2015. Since then he has had one  follow-up visit with my nurse practitioner. In the interim he is [facility]. He believes that he is a candidate for both lung and kidney transplant. 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 He returned from dialysis in November 2015 and at that time was treated for pneumonia not otherwise specified but at the same time  was started on prednisone 60 mg for his pyoderma. He is now on a prednisone taper and is on his final dose of prednisone 10 mg per day and according to him his pyoderma in the groin area as well controlled. He believes that he will stay on 10 mg of prednisone daily. Once this is cleared up at [facility] and the transplant team meets he will relocate to [facility] in the next few to several weeks. Otherwise he continues his immunomodulators of Prograf, anticoagulation with Eliquis   In terms of his respiratory status he says the prednisone high-dose started in November 2015 for his pyoderma really helped his chronic cough but now that he is tapered down to 10 mg per day his cough is return almost to the baseline severely. It is associated with white sputum. This no cold sputum there is no fever or chills. He also feels a little bit more dyspneic. He says that he is struggling to figure out if his interstitial lung disease is worse. Spirometry today shows a 10% decline in his forced vital capacity however. He also doubts whether the spirometry is accurate. He is somewhat keen on trying an antibiotic course.      Review of Systems  Constitutional: Positive for fatigue. Negative for fever and unexpected weight change.  HENT: Negative for congestion, dental problem, ear pain, nosebleeds, postnasal drip, rhinorrhea, sinus pressure, sneezing, sore throat and trouble swallowing.   Eyes: Negative for redness and itching.  Respiratory: Positive for cough and shortness of breath. Negative for chest tightness and wheezing.   Cardiovascular: Positive  for chest pain. Negative for palpitations and leg swelling.  Gastrointestinal: Negative for nausea and vomiting.  Genitourinary: Negative for dysuria.  Musculoskeletal: Negative for joint swelling.  Skin: Negative for rash.  Neurological: Negative for headaches.  Hematological: Does not bruise/bleed easily.  Psychiatric/Behavioral: Negative for dysphoric mood. The patient is not nervous/anxious.    Current outpatient prescriptions: albuterol (PROAIR HFA) 108 (90 BASE) MCG/ACT inhaler, Inhale 2 puffs into the lungs 4 (four) times daily as needed for wheezing or shortness of breath., Disp: 1 Inhaler, Rfl: 5;  apixaban (ELIQUIS) 5 MG TABS tablet, Take 5 mg by mouth 2 (two) times daily., Disp: , Rfl: ;  baclofen (LIORESAL) 20 MG tablet, Take 20 mg by mouth daily., Disp: , Rfl:  benzonatate (TESSALON) 200 MG capsule, Take 1 capsule (200 mg total) by mouth 3 (three) times daily as needed for cough., Disp: 60 capsule, Rfl: 3;  chlorpheniramine-HYDROcodone (TUSSIONEX PENNKINETIC ER) 10-8 MG/5ML LQCR, Take 5 mLs by mouth every 12 (twelve) hours as needed., Disp: 300 mL, Rfl: 0;  Cholecalciferol (VITAMIN D3) 1000 UNITS CAPS, Take 1 capsule by mouth daily., Disp: , Rfl:  esomeprazole (NEXIUM) 40 MG capsule, Take 40 mg by mouth daily before breakfast.  , Disp: , Rfl: ;  fentaNYL (DURAGESIC - DOSED MCG/HR) 25 MCG/HR patch, 1 patch every other day, alternating with 12mg /patch, Disp: , Rfl: ;  Fluticasone-Salmeterol (ADVAIR) 250-50 MCG/DOSE AEPB, Inhale 1 puff into the lungs every 12 (twelve) hours., Disp: , Rfl: ;  gabapentin (NEURONTIN) 300 MG capsule, Take 600 mg by mouth 2 (two) times daily. , Disp: , Rfl:  mupirocin ointment (BACTROBAN) 2 %, Apply 1 application topically 2 (two) times daily., Disp: 22 g, Rfl: 3;  PIRFENIDONE PO, 200mg , take 4 tablets three times a day, Disp: , Rfl: ;  predniSONE (DELTASONE) 20 MG tablet, Take 20 mg by mouth daily. , Disp: , Rfl: ;  sulfamethoxazole-trimethoprim (BACTRIM DS)  800-160 MG per tablet, Take 1 tablet by mouth every other day., Disp: , Rfl:  tacrolimus (PROGRAF) 1 MG capsule, Take 2 mg by mouth 2 (two) times daily. , Disp: , Rfl: ;  topiramate (TOPAMAX) 25 MG tablet, Take 25 mg by mouth 2 (two) times daily., Disp: , Rfl: ;  Vilazodone HCl (VIIBRYD) 40 MG TABS, Take 40 mg by mouth daily., Disp: , Rfl: ;  hydrocortisone (CORTEF) 5 MG tablet, Take 5 mg by mouth daily. 15 mg in AM and 5 mg in PM, Disp: , Rfl:      Objective:   Physical Exam   Filed Vitals:   06/19/14 1601  BP: 102/78  Pulse: 115  Height:  (1.854 m)  Weight: 231 lb (104.781 kg)  SpO2: 98%    onstitutional: He is oriented to person, place, and time. He appears well-developed and well-nourished. No distress.  albino  HENT:  Head: Normocephalic and atraumatic.  Right Ear: External ear normal.  Left Ear: External ear normal.  Mouth/Throat: Oropharynx is clear and moist. No oropharyngeal exudate.  o2 on  Eyes: Conjunctivae and EOM are normal. Pupils are equal, round, and reactive to light. Right eye exhibits no discharge. Left eye exhibits no discharge. No scleral icterus.  Neck: Normal range of motion. Neck supple. No JVD present. No tracheal deviation present. No thyromegaly present.  Cardiovascular: Normal rate, regular rhythm and intact distal pulses.  Exam reveals no gallop and no friction rub.   No murmur heard. Pulmonary/Chest: Effort normal and breath sounds normal. No respiratory distress. He has no wheezes. He has no rales. He exhibits no tenderness.  occ scattered crackles + - baseline  no change  Abdominal: Soft. Bowel sounds are normal. He exhibits no distension and no mass. There is no tenderness. There is no rebound and no guarding.  Musculoskeletal: Normal range of motion. He exhibits no edema and no tenderness.  Lymphadenopathy:    He has no cervical adenopathy.  Neurological: He is alert and oriented to person, place, and time. He has normal reflexes. No cranial  nerve deficit. Coordination normal.  Skin: Skin is warm and dry. No rash noted. He is not diaphoretic. No erythema. No pallor.  Psychiatric: He has a normal mood and affect. His behavior is normal. Judgment and thought content normal.      Assessment & Plan:     ICD-9-CM ICD-10-CM   1. ILD (interstitial lung disease) - NSIP Fibrotic Pattern on CT April 2014 associated with Hermansy Pudlak Syndrome 515 J84.9 Spirometry with Graph  2. Chronic respiratory failure with hypoxia 518.83 J96.11    799.02       Possible PFT is worse Let us treat for an acute infection with  -  Take omnicef 332m po twice daily x 5 days Do full PFT next week at [place]  - call after done so we can give you results Refill tessalon and tussionex for cough  - will avoid increased prednisone, lyrica for the same due to transplant need Prednisone taper for pyoderma per [facility] [place] Followup with [facility] transplant program Continue Pirfenex as before- reduced dose due to renal insuff   Followup  2 months or sooner if needed; LFT at followup   > 50% of this > 25 min visit spent in face to face counseling or coordination of care (15 min visit converted to 25 min)   Dr. Kalman Shan, M.D., [facility].C.P Pulmonary and Critical Care Medicine Staff Physician [facility] [place] Pager: [phone], If no answer or between  15:00h - 7:00h: call [phone]  06/19/2014 4:50 PM

## 2014-06-23 ENCOUNTER — Encounter (HOSPITAL_COMMUNITY): Payer: 59

## 2014-06-23 ENCOUNTER — Ambulatory Visit (HOSPITAL_COMMUNITY)
Admission: RE | Admit: 2014-06-23 | Discharge: 2014-06-23 | Disposition: A | Discharge status: Home / Self Care | Payer: 59 | Source: Ambulatory Visit | Attending: Internal Medicine | Admitting: Internal Medicine

## 2014-06-23 ENCOUNTER — Encounter (HOSPITAL_COMMUNITY): Payer: Self-pay

## 2014-06-23 DIAGNOSIS — J9611 Chronic respiratory failure with hypoxia: Secondary | ICD-10-CM | POA: Diagnosis not present

## 2014-06-23 DIAGNOSIS — J849 Interstitial pulmonary disease, unspecified: Secondary | ICD-10-CM | POA: Diagnosis present

## 2014-06-23 LAB — PULMONARY FUNCTION TEST
DL/VA % PRED: 56 %
DL/VA: 2.74 ml/min/mmHg/L
DLCO UNC % PRED: 35 %
DLCO UNC: 12.77 ml/min/mmHg
FEF 25-75 PRE: 1.48 L/s
FEF2575-%Pred-Pre: 32 %
FEV1-%Pred-Pre: 51 %
FEV1-Pre: 2.45 L
FEV1FVC-%Pred-Pre: 82 %
FEV6-%Pred-Pre: 61 %
FEV6-Pre: 3.59 L
FEV6FVC-%PRED-PRE: 99 %
FVC-%Pred-Pre: 61 %
FVC-Pre: 3.68 L
PRE FEV1/FVC RATIO: 67 %
PRE FEV6/FVC RATIO: 98 %
RV % PRED: 86 %
RV: 1.6 L
TLC % pred: 68 %
TLC: 5.12 L

## 2014-06-23 MED ORDER — ALBUTEROL SULFATE (2.5 MG/3ML) 0.083% IN NEBU: 2.5000 mg | INHALATION_SOLUTION | Freq: Once | RESPIRATORY_TRACT | Status: DC

## 2014-06-25 ENCOUNTER — Encounter (HOSPITAL_COMMUNITY): Payer: 59

## 2014-06-25 ENCOUNTER — Encounter: Payer: Self-pay | Admitting: Internal Medicine

## 2014-06-25 NOTE — Telephone Encounter (Signed)
Was given fax # [phone]. Records faxed.

## 2014-06-30 ENCOUNTER — Encounter (HOSPITAL_COMMUNITY): Payer: 59

## 2014-07-02 ENCOUNTER — Encounter (HOSPITAL_COMMUNITY): Payer: 59

## 2014-07-07 ENCOUNTER — Encounter (HOSPITAL_COMMUNITY): Payer: 59

## 2014-07-08 ENCOUNTER — Telehealth: Payer: Self-pay | Admitting: Internal Medicine

## 2014-07-08 MED ORDER — AZITHROMYCIN 250 MG PO TABS: ORAL_TABLET | ORAL | Status: DC

## 2014-07-08 NOTE — Telephone Encounter (Signed)
Give him Zpak; hopefully no interactions or allergies

## 2014-07-08 NOTE — Telephone Encounter (Signed)
Called and spoke to pt. Informed pt of the recs per MR. Rx sent to preferred pharmacy. Pt verbalized understanding and denied any further questions or concerns at this time.  

## 2014-07-08 NOTE — Telephone Encounter (Signed)
Called and spoke to pt. Pt c/o increase in chest congestion, prod cough with mostly yellow thick mucus with streaks of blood in mucus, increase in sinus pressure, PND and general malaise x 2-3 days. Pt stated his daughter has a cold. Pt is not taking anything OTC. Pt stated he cannot tell if his SOB has worsened. Pt denies CP/tightness and f/c/s.   Dr. [name] please advise.   Allergies  Allergen Reactions  . Avocado   . Carrot [Daucus Carota]   . Cherry   . Metronidazole     REACTION: neuropathy  . Nsaids Other (See Comments)    Plate defect  . Watermelon [Citrullus Vulgaris]

## 2014-07-09 ENCOUNTER — Encounter (HOSPITAL_COMMUNITY): Payer: 59

## 2014-07-10 ENCOUNTER — Encounter: Payer: Self-pay | Admitting: Internal Medicine

## 2014-07-10 ENCOUNTER — Telehealth: Payer: Self-pay | Admitting: Internal Medicine

## 2014-07-10 MED ORDER — CEFDINIR 300 MG PO CAPS: 300.0000 mg | ORAL_CAPSULE | Freq: Two times a day (BID) | ORAL | Status: DC

## 2014-07-10 NOTE — Telephone Encounter (Signed)
Sorr what I meant was I did not want to bump it up due current resp issue because I know he is trying to come off it slowly or cut dose down slowly to get ready for transplant

## 2014-07-10 NOTE — Telephone Encounter (Signed)
Pt states that he was given ZPAK on Wednesday 07/08/14. Reports no improvement since being on antibiotic. States that he is still getting up yellow mucus, no blood today in mucus as seen on Wednesday. Pt reports now having yellow mucus coming from sinuses, feels sinus infection is worsening. Taking Mucinex daily. Would like to know what to do next?  [facility]  Allergies  Allergen Reactions  . Avocado   . Carrot [Daucus Carota]   . Cherry   . Metronidazole     REACTION: neuropathy  . Nsaids Other (See Comments)    Plate defect  . Watermelon [Citrullus Vulgaris]    Please advise Dr [name]. Thanks.

## 2014-07-10 NOTE — Telephone Encounter (Signed)
lmomtcb x1 

## 2014-07-10 NOTE — Telephone Encounter (Signed)
1.29.16 mychart email from pt: Message     Dr. [name],        As you know, I seem to have another respiratory infection. In the midst of this, I forgot the most important thing:        I have been officially accepted for [facility]! I will be moving down there around February 16! I will be listed as soon as I am in town.        Thought you might want to know!        [name]    Message routed to MR as Lorain ChildesFYI

## 2014-07-10 NOTE — Telephone Encounter (Signed)
Try omnicef 300mg  twice daily x 7 days Still avoiding prednisone

## 2014-07-10 NOTE — Telephone Encounter (Signed)
LMTCB x 1 

## 2014-07-10 NOTE — Telephone Encounter (Signed)
Called and spoke with pt and he is aware of rx that has been sent in for the omnicef.  Pt was confused about the comment about still avoiding the prednisone. Pt stated that he is now on prednisone 10mg  daily.  He stated that this is not something that he can come off of.  He wanted MR to know this. Is there anything that we should change on his prednisone MR?   Thanks  Allergies  Allergen Reactions  . Avocado   . Carrot [Daucus Carota]   . Cherry   . Metronidazole     REACTION: neuropathy  . Nsaids Other (See Comments)    Plate defect  . Watermelon [Citrullus Vulgaris]     Current Outpatient Prescriptions on File Prior to Visit  Medication Sig Dispense Refill  . albuterol (PROAIR HFA) 108 (90 BASE) MCG/ACT inhaler Inhale 2 puffs into the lungs 4 (four) times daily as needed for wheezing or shortness of breath. 1 Inhaler 5  . apixaban (ELIQUIS) 5 MG TABS tablet Take 5 mg by mouth 2 (two) times daily.    [name]. azithromycin (ZITHROMAX Z-PAK) 250 MG tablet Take as directed. 6 each 0  . baclofen (LIORESAL) 20 MG tablet Take 20 mg by mouth daily.    . benzonatate (TESSALON) 200 MG capsule Take 1 capsule (200 mg total) by mouth 3 (three) times daily as needed for cough. 60 capsule 3  . chlorpheniramine-HYDROcodone (TUSSIONEX PENNKINETIC ER) 10-8 MG/5ML LQCR Take 5 mLs by mouth every 12 (twelve) hours as needed. 300 mL 0  . chlorpheniramine-HYDROcodone (TUSSIONEX PENNKINETIC ER) 10-8 MG/5ML LQCR Take 5 mLs by mouth every 12 (twelve) hours as needed for cough. 300 mL 0  . Cholecalciferol (VITAMIN D3) 1000 UNITS CAPS Take 1 capsule by mouth daily.    [name]. esomeprazole (NEXIUM) 40 MG capsule Take 40 mg by mouth daily before breakfast.      . fentaNYL (DURAGESIC - DOSED MCG/HR) 25 MCG/HR patch 1 patch every other day, alternating with 12mg /patch    . Fluticasone-Salmeterol (ADVAIR) 250-50 MCG/DOSE AEPB Inhale 1 puff into the lungs every 12 (twelve) hours.    . gabapentin (NEURONTIN) 300 MG capsule Take 600  mg by mouth 2 (two) times daily.     . hydrocortisone (CORTEF) 5 MG tablet Take 5 mg by mouth daily. 15 mg in AM and 5 mg in PM    . mupirocin ointment (BACTROBAN) 2 % Apply 1 application topically 2 (two) times daily. 22 g 3  . PIRFENIDONE PO 200mg , take 4 tablets three times a day    . predniSONE (DELTASONE) 20 MG tablet Take 20 mg by mouth daily.     [name]. sulfamethoxazole-trimethoprim (BACTRIM DS) 800-160 MG per tablet Take 1 tablet by mouth every other day.    . tacrolimus (PROGRAF) 1 MG capsule Take 2 mg by mouth 2 (two) times daily.     [name]. topiramate (TOPAMAX) 25 MG tablet Take 25 mg by mouth 2 (two) times daily.    . Vilazodone HCl (VIIBRYD) 40 MG TABS Take 40 mg by mouth daily.     No current facility-administered medications on file prior to visit.

## 2014-07-10 NOTE — Telephone Encounter (Signed)
Please let patient know my best wishes and prayers. They and he can reach me on my email [email or web address] - when he is there. I have a cousin in [place] or if I go for meeting will try to visit him

## 2014-07-13 ENCOUNTER — Ambulatory Visit (INDEPENDENT_AMBULATORY_CARE_PROVIDER_SITE_OTHER)
Admission: RE | Admit: 2014-07-13 | Discharge: 2014-07-13 | Disposition: A | Discharge status: Home / Self Care | Payer: 59 | Source: Ambulatory Visit | Attending: Critical Care Medicine | Admitting: Critical Care Medicine

## 2014-07-13 ENCOUNTER — Ambulatory Visit (INDEPENDENT_AMBULATORY_CARE_PROVIDER_SITE_OTHER): Payer: 59 | Admitting: Critical Care Medicine

## 2014-07-13 ENCOUNTER — Encounter: Payer: Self-pay | Admitting: Critical Care Medicine

## 2014-07-13 ENCOUNTER — Telehealth: Payer: Self-pay | Admitting: Internal Medicine

## 2014-07-13 VITALS — BP 122/78 | HR 121 | Temp 98.1°F | Ht 73.0 in | Wt 231.8 lb

## 2014-07-13 DIAGNOSIS — J0191 Acute recurrent sinusitis, unspecified: Secondary | ICD-10-CM

## 2014-07-13 DIAGNOSIS — J849 Interstitial pulmonary disease, unspecified: Secondary | ICD-10-CM

## 2014-07-13 NOTE — Telephone Encounter (Signed)
After speaking with pt he advised me that he has already been scheduled to see us. Nothing further was needed.

## 2014-07-13 NOTE — Telephone Encounter (Signed)
Called pt and he reports he is coughing up discolored mucus now and also having some pain. appt scheduled to see PW this AM at 11

## 2014-07-13 NOTE — Patient Instructions (Signed)
Chest xray today Stay on omniceph We will call results

## 2014-07-13 NOTE — Progress Notes (Signed)
Subjective:    Patient ID: [name], male    DOB: [birth year], 34 y.o.   MRN: [id number]  HPI    PCP Sanda Linger, MD Heme-ONc - [name] - [facility] + [facility] (Was followed by Dr Maple Hudson through Sept 2014 and then switched to Dr Elly Modena) Renal:  DR Gwenith Spitz your nephrologist  PROB LIST  # asthma, allergic rhinitis NOS  # recurrent respiratory infections NOS  #Venous Thrombo-Embolism- Recurrent DVT: Hematologist at [facility]  - ? First episode after colectomy in 2012  - Doppler- Had L popliteal vein DVT on 07/30/12  - 02/26/13  = POSITIVE DVT: acute deep vein thrombosis involving the left popliteal and distal femoral veins.  - Rx coumadin changed to Eliquis oct/Nov 2014 at [facility]  #Chronic Renal Insufficiency  - Creat 2.3mg % - Nov 2014, 2.1mg % nin April 2015 -  - GFR 44 per patient Jan 2015 at [facility] Nculear scan study  - worsening creatinine to % 03/10/14  #obesity  - Body mass index is 28.4 kg/(m^2). on 07/17/2013: this is a result of aggressie weight loss program since fall 2014  - Body mass index is 29.43 kg/(m^2). on 03/11/2014  = Body mass index is 30.59 kg/(m^2).  07/14/2014      #REcurrent bronchitis/respiratory tract infection    #Chronic Pain ad chronic benzo  - fentanyl patch  #Hermansky-Pudlak Syndrome  #Inflammatory bowel disease; Chrons'    - s/p Total colectomy 2012   - on TNF alpha - simponi throiugh [facility]: changed to empiric Prograf by [facility] Session - summer 2015 as part of lung transplant eval trial    # albinism,     -   #Pulmonary fibrosis/ILD - new since spring 2014   -  April 2014: FVC 4.30/74%, FEV1 3.45/77%, FEV1/FVC 0.80. TLC 92%, RV 121%, DLCO 40%.   -  CT 10/03/12 : IMPRESSION: Slightly upper lobe predominant peribronchovascular fibrosis, as  detailed above. This is new since 07/28/2008   - May 2014: 3L with exertion  - - Aug 2014: 6L with exertion. sTarted pirfenidone from [place],  x 4, TID 0> ch anged to   x 3, tid Sept 2015 due to renal insuff\   - Nov 2014: Tussionex for cough   - Rjenected for lung and kidney transplant at [facility] phone call with Dr Finis Bud - unlikely tx candidate)   - Undergoing Tx eval at [facility] - summer 2015      PFT FVC fev1 ratio BD fev1 TLC DLCO Walk test RX  April 2014 4.3L/74% 3.45L/77% 0.8 x xL/92% x/40%    Aug 2014        Start pirfenex from [place]  Dec 2014 (post bd value) 4.26L/71%    5.36/71% 14.3/39%    April 2015       174ft x 3 laps on 6L : did not desat   03/10/14 (post bd value) 4.09L/68% 3.15L/65% 0.77 x 5.07/67% 14.8/40%    07/14/2014 (office spiro, pre BD) 3.65L/63% 2.58L/55% 0.71 x          .  OV 03/10/2014  Chief Complaint  Patient presents with  . Follow-up    Pt here after PFT. Pt states he has had a slight increase in dry cough. Pt denies CP/tightness.    34 yea old male with complicated medical problems as above. Accompanied by wife and father. A Very remarkable young man who will never give up. I personally not seen him in  many months. He has spetn the summer trying to get a lung tx at [facility]. Reports that initially they were concerned about his ability to  Handle post tx immunomodulators esp with IBD and simponi Rx. So, they hae him on empiric prograf instead of simponi (12 week Rx ended yesterday but he is continuing) to see if it will keep IBD in remission. Per patient it seems to be helping IBD but says his recent creat tested at [facility] (results NA to me) is high at 3mg %. So now they have rejected him for lung tx but are evaluting him for lung-kidney at same time. HE is pessmistic that he will be  Rejected. Their fall back option os [address] and [employer]. He is continuing to push and advocate for his health care  In terms of his ILD: cough worse past few or several day but only mild and is part of cycle. His PFT show a 5% decline in TLC and 4% with FVC but unchanged DLCO. These might be clinically  significant or not. Trend line not fully established; Atleast DLCO is unchaned. He still uses 6L [place]. HE has supply of 8 months of Pirfenex from [place]. HE is still trying to get a pulmonary doc in [place] to [place] esbriet. He wants refill on tussionex. Toelrating pirfenex well without sunburn or gi issues despite rise in creatinine. Explained that pirfenex levels go up in setting of renal insuff  Other issues  - wants vit d checked - uptodate with vaccines - wil have me refer him to [facility] if [place] option does not work for transplant   05/01/2014 Acute OV  Presents for increased cough over last 3 days.  Mainly dry in nature , no discolored mucus.  Has returned from [facility].  Denies f/c/s, n/v/d, wheezing, tightness, purulent sputum.  Request cxr today .   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  He says this is from his hx of colitis (s/p colectomy 2012)  Says this is a concern if he gets transplant with immunosuppression .  Now on prednisone 60mg  (plan for taper over 6 weeks)  Also on prograf as well.   Says he has to move to [place] to get listed on the Transplant list. Has not been officially accepted into the program. Plans for Kidney /Lung transplant.  The hold up for now is the Pyoderma get totally resolved before he will be accepted.   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 Since then he has had one follow-up visit with my nurse practitioner. In the interim he is registered with industry of [facility]. He believes that he is a candidate for both lung and kidney transplant. 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 He  returned from dialysis in November 2015 and at that time was treated for pneumonia not otherwise specified but at the same time was started on prednisone 60 mg for his pyoderma. He is now on a prednisone taper and is on his final dose of prednisone 10 mg per day and according to him his pyoderma in the groin area as well controlled. He believes that he will stay on  10 mg of prednisone daily. Once this is cleared up at [facility] and the transplant team meets he will relocate to [facility] in the next few to several weeks. Otherwise he continues his immunomodulators of Prograf, anticoagulation with Eliquis   In terms of his respiratory status he says the prednisone high-dose started in November 2015 for his pyoderma really helped his chronic cough but now that he is tapered down to 10 mg per day his cough is return almost to the baseline severely. It is associated with white sputum. This no cold sputum there is no fever or chills. He also feels a little bit more dyspneic. He says that he is struggling to figure out if his interstitial lung disease is worse. Spirometry today shows a 10% decline in his forced vital capacity however. He also doubts whether the spirometry is accurate. He is somewhat keen on trying an antibiotic course.   07/13/2014 Chief Complaint  Patient presents with  . Acute Visit    MR pt; prod cough w/mucus changing from yellow to clear, fever Sat of 100.5, wheezing last night, this am sharp pain in upper RT chest when inhaling. No c/o n/v/d.  Pt notes sudden onset cough and pain for 7days.  Hx of pulm fibrosis and hx of IPF and normally coughs.  Pt has sinus pressure and yellow out of the nose.  Pt called one week ago and Rx Zpak: no help.  Then Friday last week Rx omniceph and helped a bit. But now sputum yellow to be clearer and less.  Now awakened pain in R lung area.  Pain is worse with deep breath.     Review of Systems  Constitutional: Positive for fatigue. Negative for fever and  unexpected weight change.  HENT: Negative for congestion, dental problem, ear pain, nosebleeds, postnasal drip, rhinorrhea, sinus pressure, sneezing, sore throat and trouble swallowing.   Eyes: Negative for redness and itching.  Respiratory: Positive for cough and shortness of breath. Negative for chest tightness and wheezing.   Cardiovascular: Positive for chest pain. Negative for palpitations and leg swelling.  Gastrointestinal: Negative for nausea and vomiting.  Genitourinary: Negative for dysuria.  Musculoskeletal: Negative for joint swelling.  Skin: Negative for rash.  Neurological: Negative for headaches.  Hematological: Does not bruise/bleed easily.  Psychiatric/Behavioral: Negative for dysphoric mood. The patient is not nervous/anxious.     Current outpatient prescriptions:  .  albuterol (PROAIR HFA) 108 (90 BASE) MCG/ACT inhaler, Inhale 2 puffs into the lungs 4 (four) times daily as needed for wheezing or shortness of breath., Disp: 1 Inhaler, Rfl: 5 .  apixaban (ELIQUIS) 5 MG TABS tablet, Take 5 mg by mouth 2 (two) times daily., Disp: , Rfl:  .  baclofen (LIORESAL) 20 MG tablet, Take 20 mg by mouth daily., Disp: , Rfl:  .  benzonatate (TESSALON) 200 MG capsule, Take 1 capsule (200 mg total) by mouth 3 (three) times daily as needed for cough., Disp: 60 capsule, Rfl: 3 .  cefdinir (OMNICEF) 300 MG capsule, Take 1 capsule (300 mg total) by mouth 2 (two) times daily., Disp: 14 capsule, Rfl: 0 .  chlorpheniramine-HYDROcodone (TUSSIONEX PENNKINETIC ER) 10-8 MG/5ML LQCR, Take 5 mLs by mouth every 12 (twelve) hours as needed., Disp: 300 mL, Rfl: 0 .  chlorpheniramine-HYDROcodone (TUSSIONEX PENNKINETIC ER) 10-8 MG/5ML LQCR, Take 5 mLs by mouth every 12 (twelve) hours as needed for cough., Disp: 300 mL, Rfl: 0 .  Cholecalciferol (VITAMIN D3) 1000 UNITS CAPS, Take 1 capsule by mouth daily., Disp: ,  Rfl:  .  esomeprazole (NEXIUM) 40 MG capsule, Take 40 mg by mouth daily before breakfast.  ,  Disp: , Rfl:  .  fentaNYL (DURAGESIC - DOSED MCG/HR) 25 MCG/HR patch, 1 patch every other day, alternating with /patch, Disp: , Rfl:  .  Fluticasone-Salmeterol (ADVAIR) 250-50 MCG/DOSE AEPB, Inhale 1 puff into the lungs every 12 (twelve) hours., Disp: , Rfl:  .  gabapentin (NEURONTIN) 300 MG capsule, Take 600 mg by mouth 2 (two) times daily. , Disp: , Rfl:  .  mupirocin ointment (BACTROBAN) 2 %, Apply 1 application topically 2 (two) times daily., Disp: 22 g, Rfl: 3 .  PIRFENIDONE PO, , take 4 tablets three times a day, Disp: , Rfl:  .  predniSONE (DELTASONE) 10 MG tablet, Take 10 mg by mouth daily with breakfast., Disp: , Rfl:  .  sulfamethoxazole-trimethoprim (BACTRIM DS) 800-160 MG per tablet, Take 1 tablet by mouth every other day., Disp: , Rfl:  .  tacrolimus (PROGRAF) 1 MG capsule, Take 2 mg by mouth 2 (two) times daily. , Disp: , Rfl:  .  topiramate (TOPAMAX) 25 MG tablet, Take 25 mg by mouth 2 (two) times daily., Disp: , Rfl:  .  Vilazodone HCl (VIIBRYD) 40 MG TABS, Take 40 mg by mouth daily., Disp: , Rfl:  .  hydrocortisone (CORTEF) 5 MG tablet, Take 5 mg by mouth daily. 15 mg in AM and 5 mg in PM, Disp: , Rfl:      Objective:   Physical Exam   Filed Vitals:   07/13/14 1116  BP: 122/78  Pulse: 121  Temp: 98.1 F (36.7 C)  TempSrc: Oral  Height:  (1.854 m)  Weight: 231 lb 12.8 oz (105.144 kg)  SpO2: 94%    Filed Vitals:   07/13/14 1116  BP: 122/78  Pulse: 121  Temp: 98.1 F (36.7 C)  TempSrc: Oral  Height:  (1.854 m)  Weight: 231 lb 12.8 oz (105.144 kg)  SpO2: 94%    Gen: Pleasant, well-nourished, in no distress,  normal affect  ENT: No lesions,  mouth clear,  oropharynx clear, no postnasal drip, mild nasal purulence  Neck: No JVD, no TMG, no carotid bruits  Lungs: No use of accessory muscles, no dullness to percussion, bilateral dry rales  Cardiovascular: RRR, heart sounds normal, no murmur or gallops, no peripheral edema  Abdomen: soft  and NT, no HSM,  BS normal  Musculoskeletal: No deformities, no cyanosis or clubbing  Neuro: alert, non focal  Skin: Warm, no lesions or rashes, albino skin  Dg Chest 2 View  07/13/2014   CLINICAL DATA:  34 year old male with a history of interstitial fibrosis, as a manifestation of Hermansky-Pudlak Syndrome.  EXAM: CHEST - 2 VIEW  COMPARISON:  05/01/2014, 09/23/2013, 07/01/2013, CT 10/02/2012  FINDINGS: Cardiomediastinal silhouette is unchanged in size and contour, partially obscured by overlying lung and pleural disease.  Similar distribution of mixed interstitial and airspace opacities involving the upper and lower lungs. No pneumothorax. Lung disease partially obscures the heart borders and the bilateral hemidiaphragms.  No displaced fracture.  Similar appearance of the configuration of the spine with mild S-shaped scoliosis and accentuated kyphotic curvature.  IMPRESSION: Similar appearance and distribution of mixed interstitial and airspace opacities, compatible with the patient's known history of interstitial lung fibrosis. Overlying acute component of pneumonitis/pneumonia cannot be excluded.  Signed,  Yvone Neu. Loreta Ave, DO  [facility]  [facility]   Electronically Signed   By: Gilmer Mor D.O.  On: 07/13/2014 14:25    Assessment & Plan:     ICD-9-CM ICD-10-CM   1. ILD (interstitial lung disease) - NSIP Fibrotic Pattern on CT April 2014 associated with Hermansy Pudlak Syndrome 515 J84.9 DG Chest 2 View   ILD (interstitial lung disease) - NSIP Fibrotic Pattern on CT April 2014 associated with Hermansy Pudlak Syndrome Interstitial lung disease with associated acute sinusitis but no evidence of pneumonia on current chest x-ray Chest pain syndrome likely from cyclical cough Plan The patient is to finish current course of Omnicef No additional change in medication profile is recommended    Updated Medication List Outpatient Encounter  Prescriptions as of 07/13/2014  Medication Sig  . albuterol (PROAIR HFA) 108 (90 BASE) MCG/ACT inhaler Inhale 2 puffs into the lungs 4 (four) times daily as needed for wheezing or shortness of breath.  [name]. apixaban (ELIQUIS) 5 MG TABS tablet Take 5 mg by mouth 2 (two) times daily.  . baclofen (LIORESAL) 20 MG tablet Take 20 mg by mouth daily.  . benzonatate (TESSALON) 200 MG capsule Take 1 capsule (200 mg total) by mouth 3 (three) times daily as needed for cough.  . cefdinir (OMNICEF) 300 MG capsule Take 1 capsule (300 mg total) by mouth 2 (two) times daily.  . chlorpheniramine-HYDROcodone (TUSSIONEX PENNKINETIC ER) 10-8 MG/5ML LQCR Take 5 mLs by mouth every 12 (twelve) hours as needed.  . chlorpheniramine-HYDROcodone (TUSSIONEX PENNKINETIC ER) 10-8 MG/5ML LQCR Take 5 mLs by mouth every 12 (twelve) hours as needed for cough.  . Cholecalciferol (VITAMIN D3) 1000 UNITS CAPS Take 1 capsule by mouth daily.  [name]. esomeprazole (NEXIUM) 40 MG capsule Take 40 mg by mouth daily before breakfast.    . fentaNYL (DURAGESIC - DOSED MCG/HR) 25 MCG/HR patch 1 patch every other day, alternating with 12mg /patch  . Fluticasone-Salmeterol (ADVAIR) 250-50 MCG/DOSE AEPB Inhale 1 puff into the lungs every 12 (twelve) hours.  . gabapentin (NEURONTIN) 300 MG capsule Take 600 mg by mouth 2 (two) times daily.   . mupirocin ointment (BACTROBAN) 2 % Apply 1 application topically 2 (two) times daily.  [name]. PIRFENIDONE PO 200mg , take 4 tablets three times a day  . predniSONE (DELTASONE) 10 MG tablet Take 10 mg by mouth daily with breakfast.  . sulfamethoxazole-trimethoprim (BACTRIM DS) 800-160 MG per tablet Take 1 tablet by mouth every other day.  . tacrolimus (PROGRAF) 1 MG capsule Take 2 mg by mouth 2 (two) times daily.   [name]. topiramate (TOPAMAX) 25 MG tablet Take 25 mg by mouth 2 (two) times daily.  . Vilazodone HCl (VIIBRYD) 40 MG TABS Take 40 mg by mouth daily.  . [DISCONTINUED] predniSONE (DELTASONE) 20 MG tablet Take 10 mg by  mouth daily.   . hydrocortisone (CORTEF) 5 MG tablet Take 5 mg by mouth daily. 15 mg in AM and 5 mg in PM  . [DISCONTINUED] azithromycin (ZITHROMAX Z-PAK) 250 MG tablet Take as directed. (Patient not taking: Reported on 07/13/2014)

## 2014-07-14 ENCOUNTER — Encounter (HOSPITAL_COMMUNITY): Payer: 59

## 2014-07-14 NOTE — Assessment & Plan Note (Signed)
Interstitial lung disease with associated acute sinusitis but no evidence of pneumonia on current chest x-ray Chest pain syndrome likely from cyclical cough Plan The patient is to finish current course of Omnicef No additional change in medication profile is recommended

## 2014-07-15 ENCOUNTER — Encounter: Payer: Self-pay | Admitting: Internal Medicine

## 2014-07-15 DIAGNOSIS — J849 Interstitial pulmonary disease, unspecified: Secondary | ICD-10-CM

## 2014-07-15 DIAGNOSIS — J9611 Chronic respiratory failure with hypoxia: Secondary | ICD-10-CM

## 2014-07-15 NOTE — Telephone Encounter (Signed)
Please do High Resolution CT chest without contrast on ILD protocol. Only  Dr Leanna BattlesMelinda Blietz or Dr. Trudie Reedaniel Entrikin to read  See if you can get his HRCT chest 07/16/14   I can see him Friday at noon - [place] census is light right now

## 2014-07-15 NOTE — Telephone Encounter (Signed)
Per 07/13/14 acute OV w/ PW: Patient Instructions     Chest xray today Stay on omniceph We will call results   Please advise MR on patient advice thanks  Allergies  Allergen Reactions  . Avocado   . Carrot [Daucus Carota]   . Cherry   . Metronidazole     REACTION: neuropathy  . Nsaids Other (See Comments)    Plate defect  . Watermelon [Citrullus Vulgaris]      Current Outpatient Prescriptions on File Prior to Visit  Medication Sig Dispense Refill  . albuterol (PROAIR HFA) 108 (90 BASE) MCG/ACT inhaler Inhale 2 puffs into the lungs 4 (four) times daily as needed for wheezing or shortness of breath. 1 Inhaler 5  . apixaban (ELIQUIS) 5 MG TABS tablet Take 5 mg by mouth 2 (two) times daily.    . baclofen (LIORESAL) 20 MG tablet Take 20 mg by mouth daily.    . benzonatate (TESSALON) 200 MG capsule Take 1 capsule (200 mg total) by mouth 3 (three) times daily as needed for cough. 60 capsule 3  . cefdinir (OMNICEF) 300 MG capsule Take 1 capsule (300 mg total) by mouth 2 (two) times daily. 14 capsule 0  . chlorpheniramine-HYDROcodone (TUSSIONEX PENNKINETIC ER) 10-8 MG/5ML LQCR Take 5 mLs by mouth every 12 (twelve) hours as needed. 300 mL 0  . chlorpheniramine-HYDROcodone (TUSSIONEX PENNKINETIC ER) 10-8 MG/5ML LQCR Take 5 mLs by mouth every 12 (twelve) hours as needed for cough. 300 mL 0  . Cholecalciferol (VITAMIN D3) 1000 UNITS CAPS Take 1 capsule by mouth daily.    [name]. esomeprazole (NEXIUM) 40 MG capsule Take 40 mg by mouth daily before breakfast.      . fentaNYL (DURAGESIC - DOSED MCG/HR) 25 MCG/HR patch 1 patch every other day, alternating with 12mg /patch    . Fluticasone-Salmeterol (ADVAIR) 250-50 MCG/DOSE AEPB Inhale 1 puff into the lungs every 12 (twelve) hours.    . gabapentin (NEURONTIN) 300 MG capsule Take 600 mg by mouth 2 (two) times daily.     . hydrocortisone (CORTEF) 5 MG tablet Take 5 mg by mouth daily. 15 mg in AM and 5 mg in PM    . mupirocin ointment (BACTROBAN) 2 % Apply 1  application topically 2 (two) times daily. 22 g 3  . PIRFENIDONE PO 200mg , take 4 tablets three times a day    . predniSONE (DELTASONE) 10 MG tablet Take 10 mg by mouth daily with breakfast.    . sulfamethoxazole-trimethoprim (BACTRIM DS) 800-160 MG per tablet Take 1 tablet by mouth every other day.    . tacrolimus (PROGRAF) 1 MG capsule Take 2 mg by mouth 2 (two) times daily.     [name]. topiramate (TOPAMAX) 25 MG tablet Take 25 mg by mouth 2 (two) times daily.    . Vilazodone HCl (VIIBRYD) 40 MG TABS Take 40 mg by mouth daily.     No current facility-administered medications on file prior to visit.

## 2014-07-16 ENCOUNTER — Other Ambulatory Visit: Payer: Self-pay | Admitting: Internal Medicine

## 2014-07-16 ENCOUNTER — Ambulatory Visit (INDEPENDENT_AMBULATORY_CARE_PROVIDER_SITE_OTHER)
Admission: RE | Admit: 2014-07-16 | Discharge: 2014-07-16 | Disposition: A | Discharge status: Home / Self Care | Payer: 59 | Source: Ambulatory Visit | Attending: Internal Medicine | Admitting: Internal Medicine

## 2014-07-16 ENCOUNTER — Inpatient Hospital Stay: Admission: RE | Admit: 2014-07-16 | Payer: Self-pay | Source: Ambulatory Visit

## 2014-07-16 ENCOUNTER — Encounter (HOSPITAL_COMMUNITY): Payer: 59

## 2014-07-16 DIAGNOSIS — R0600 Dyspnea, unspecified: Secondary | ICD-10-CM

## 2014-07-17 ENCOUNTER — Telehealth: Payer: Self-pay | Admitting: Internal Medicine

## 2014-07-17 ENCOUNTER — Ambulatory Visit (INDEPENDENT_AMBULATORY_CARE_PROVIDER_SITE_OTHER): Payer: 59 | Admitting: Internal Medicine

## 2014-07-17 ENCOUNTER — Encounter: Payer: Self-pay | Admitting: Internal Medicine

## 2014-07-17 VITALS — BP 138/98 | HR 115 | Ht 73.0 in | Wt 231.0 lb

## 2014-07-17 DIAGNOSIS — J849 Interstitial pulmonary disease, unspecified: Secondary | ICD-10-CM

## 2014-07-17 DIAGNOSIS — J9611 Chronic respiratory failure with hypoxia: Secondary | ICD-10-CM

## 2014-07-17 MED ORDER — PREDNISONE 10 MG PO TABS: ORAL_TABLET | ORAL | Status: AC

## 2014-07-17 MED ORDER — AMOXICILLIN-POT CLAVULANATE 875-125 MG PO TABS: 1.0000 | ORAL_TABLET | Freq: Two times a day (BID) | ORAL | Status: AC

## 2014-07-17 NOTE — Telephone Encounter (Signed)
Spoke with Aurther Lofterry with [facility] Needing clarification of order placed today - Aurther Lofterry states that she sees that 15L/min O2 is required to use during strenuous activities, Aurther Lofterry states that their Portable tanks only go up to 10L/mins and they cannot accommodate the 15L/min.  Aurther Lofterry states a few questions needs to be answered in order to figure out how to best work with patient,  --What is classified as "strenuous" activity? --How often will the patient need to use 15L/min?  New order will be placed with this information in order.  Please advise Dr [name]. Thanks.

## 2014-07-17 NOTE — Telephone Encounter (Addendum)
Just start with 10L - aim to keep pulse ox > 88%. HE cannot and should nopt be doing "strenouus" activity. STrenuous for him is > 2 or 3 flights of stairs, lifiting more than > 20-40 pounds weight etc,  If this does not work they need to figure out how to get him more o2

## 2014-07-17 NOTE — Telephone Encounter (Signed)
Robynn Panelise  CT chest is worse. [place] census suddenly got real busy. Check with me at 11am if I can make it to office by noon. He will need a spirometry in office as well. If I cannot make it by noon, I will start at 13.15 or so  Thanks  Dr. Kalman ShanMurali Emmet Messer, M.D., [facility]P Pulmonary and Critical Care Medicine Staff Physician [facility] [place] [facility] Pager: [phone], If no answer or between  15:00h - 7:00h: call [phone]  07/17/2014 7:26 AM

## 2014-07-17 NOTE — Patient Instructions (Addendum)
ICD-9-CM ICD-10-CM   1. ILD (interstitial lung disease) - NSIP Fibrotic Pattern on CT April 2014 associated with Hermansy Pudlak Syndrome 515 J84.9 Spirometry with Graph  2. Chronic respiratory failure with hypoxia 518.83 J96.11    799.02     ILD is definitely worse - Pirfenex not working anymore Alternate possibility is opportunistiic infection Too high risk for bronch prior to [place] trip to [place] for transplant Transplant is next best options Let us treat empirically for current worsening  - Please take Take prednisone 40mg  once daily x 3 days, then 30mg  once daily x 3 days, then 20mg  once daily x 3 days, then prednisone 10mg  once daily  To continue  - Augmentin 875mg  twice daily x 5 days Tessalon and tussionex for cough  - ensure refill [facility]  Followup   - be in touch as needed anytime

## 2014-07-17 NOTE — Progress Notes (Signed)
Subjective:    Patient ID: [name], male    DOB: June 23, 1980, 34 y.o.   MRN: [id number]  HPI   PCP Sanda Linger, MD Heme-ONc - [name] - [facility] + [facility] (Was followed by Dr Maple Hudson through Sept 2014 and then switched to Dr Elly Modena) Renal:  DR Gwenith Spitz your nephrologist  PROB LIST  # asthma, allergic rhinitis NOS  # recurrent respiratory infections NOS  #Venous Thrombo-Embolism- Recurrent DVT: Hematologist at [facility]  - ? First episode after colectomy in 2012  - Doppler- Had L popliteal vein DVT on 07/30/12  - 02/26/13  = POSITIVE DVT: acute deep vein thrombosis involving the left popliteal and distal femoral veins.  - Rx coumadin changed to Eliquis oct/Nov 2014 at [facility]  #Chronic Renal Insufficiency  - Creat 2.3mg % - Nov 2014, 2.1mg % nin April 2015 -  - GFR 44 per patient Jan 2015 at [facility] Nculear scan study  - worsening creatinine to % 03/10/14  #obesity  - Body mass index is 28.4 kg/(m^2). on 07/17/2013: this is a result of aggressie weight loss program since fall 2014  - Body mass index is 29.43 kg/(m^2). on 03/11/2014  = Body mass index is 30.59 kg/(m^2).  07/14/2014      #REcurrent bronchitis/respiratory tract infection    #Chronic Pain ad chronic benzo  - fentanyl patch  #Hermansky-Pudlak Syndrome  #Inflammatory bowel disease; Chrons'    - s/p Total colectomy 2012   - on TNF alpha - simponi throiugh [facility]: changed to empiric Prograf by Waldron Session - summer 2015 as part of lung transplant eval trial    # albinism,     -   #Pulmonary fibrosis/ILD - new since spring 2014   -  April 2014: FVC 4.30/74%, FEV1 3.45/77%, FEV1/FVC 0.80. TLC 92%, RV 121%, DLCO 40%.   -  CT 10/03/12 : IMPRESSION: Slightly upper lobe predominant peribronchovascular fibrosis, as  detailed above. This is new since 07/28/2008   - May 2014: 3L with exertion  - - Aug 2014: 6L with exertion. sTarted pirfenidone from [place],  x 4, TID 0> ch anged to   x 3, tid Sept 2015 due to renal insuff\   - Nov 2014: Tussionex for cough   - Rjenected for lung and kidney transplant at [facility] 2015 [facility] phone call with Dr Finis Bud - unlikely tx candidate)   - Undergoing Tx eval at [facility] - summer 2015      PFT FVC fev1 ratio BD fev1 TLC DLCO Walk test RX CT CHEST  April 2014 4.3L/74% 3.45L/77% 0.8 x xL/92% x/40%   NOn-UIP pattern  Aug 2014        Start pirfenex from [place]   Dec 2014 (post bd value) 4.26L/71%    5.36/71% 14.3/39%     April 2015       116ft x 3 laps on 6L : did not desat    03/10/14 (post bd value) 4.09L/68% 3.15L/65% 0.77 x 5.07/67% 14.8/40%     06/23/2014 full PFT 3.68L/61% 2.45L/55% 0.71 x 5.12/68% 12.77/25%     07/17/2014  3.31/57% 2.36L/50%  0.71 x     Worse 07/26/14      .  OV 03/10/2014  Chief Complaint  Patient presents with  . Follow-up    Pt here after PFT. Pt states he has had a slight increase in dry cough. Pt denies CP/tightness.    74 yea old male with complicated medical problems as above. Accompanied  by wife and father. A Very remarkable young man who will never give up. I personally not seen him in many months. He has spetn the summer trying to get a lung tx at [facility]. Reports that initially they were concerned about his ability to  Handle post tx immunomodulators esp with IBD and simponi Rx. So, they hae him on empiric prograf instead of simponi (12 week Rx ended yesterday but he is continuing) to see if it will keep IBD in remission. Per patient it seems to be helping IBD but says his recent creat tested at [facility] (results NA to me) is high at %. So now they have rejected him for lung tx but are evaluting him for lung-kidney at same time. HE is pessmistic that he will be  Rejected. Their fall back option os [address] and [employer]. He is continuing to push and advocate for his health care  In terms of his ILD: cough worse past few or several day but only mild and is part of cycle. His  PFT show a 5% decline in TLC and 4% with FVC but unchanged DLCO. These might be clinically significant or not. Trend line not fully established; Atleast DLCO is unchaned. He still uses 6L [place]. HE has supply of 8 months of Pirfenex from [place]. HE is still trying to get a pulmonary doc in [place] to [place] him [place] esbriet. He wants refill on tussionex. Toelrating pirfenex well without sunburn or gi issues despite rise in creatinine. Explained that pirfenex levels go up in setting of renal insuff  Other issues  - wants vit d checked - uptodate with vaccines - wil have me refer him to [facility] if [place] option does not work for transplant   05/01/2014 Acute OV  Presents for increased cough over last 3 days.  Mainly dry in nature , no discolored mucus.  Has returned from [facility]  Denies f/c/s, n/v/d, wheezing, tightness, purulent sputum.  Request cxr today .   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  He says this is from his hx of colitis (s/p colectomy 2012)  Says this is a concern if he gets transplant with immunosuppression .  Now on prednisone  (plan for taper over 6 weeks)  Also on prograf as well.   Says he has to move to [place] to get listed on the Transplant list. Has not been officially accepted into the program. Plans for Kidney /Lung transplant.  The hold up for now is the Pyoderma get totally resolved before he will be accepted.   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 Since then he has had one follow-up visit with my nurse practitioner. In the interim he is registered with [facility]. He believes that he is a  candidate for both lung and kidney transplant. 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 He returned from dialysis in November 2015 and at that time was treated for pneumonia not otherwise specified but at the same time was started on prednisone 60 mg for his pyoderma. He is now on a prednisone taper and is on his final dose of prednisone 10 mg per day  and according to him his pyoderma in the groin area as well controlled. He believes that he will stay on 10 mg of prednisone daily. Once this is cleared up at [facility] and the transplant team meets he will relocate to [facility] in the next few to several weeks. Otherwise he continues his immunomodulators of Prograf, anticoagulation with Eliquis   In terms of his respiratory status he says the prednisone high-dose started in November 2015 for his pyoderma really helped his chronic cough but now that he is tapered down to 10 mg per day his cough is return almost to the baseline severely. It is associated with white sputum. This no cold sputum there is no fever or chills. He also feels a little bit more dyspneic. He says that he is struggling to figure out if his interstitial lung disease is worse. Spirometry today shows a 10% decline in his forced vital capacity however. He also doubts whether the spirometry is accurate. He is somewhat keen on trying an antibiotic course.   07/13/2014 Chief Complaint  Patient presents with  . Acute Visit    MR pt; prod cough w/mucus changing from yellow to clear, fever Sat of 100.5, wheezing last night, this am sharp pain in upper RT chest when inhaling. No c/o n/v/d.  Pt notes sudden onset cough and pain for 7days.  Hx of pulm fibrosis and hx of IPF and normally coughs.  Pt has sinus pressure and yellow out of the nose.  Pt called one week ago and Rx Zpak: no help.  Then Friday last week Rx omniceph and helped a bit. But now sputum yellow to be clearer and less.  Now awakened pain in R lung area.  Pain is worse with deep  breath.    OV 07/17/2014  Chief Complaint  Patient presents with  . Follow-up    Pt stated his breathing has slightly imporved since he saw PW. Pt c/o cough that was clear and now starting to turn to a yellow color. Pt states he his sinus pressure has improved but his ears still feel clogged. Pt states he also has yellow nasal mucus.    Follow-up interstitial lung disease, chronic respiratory failure 6 L baseline oxygen use in the setting of her Hermansky Puldak syndrome. On prednisone chronic and Prograf  He says that he now has an apartment in [facility] and is planning to move there on 08/01/2014. Once he gets that he says that he will be and listed in a lung and kidney transplant program. However since early part of this year he has had progressive worsening of dyspnea and exertion associated with what sounds like respiratory exacerbations requiring frequent antibiotic therapy. He is completed several courses of antibody therapy. Last month we notice his pulmonary function test worsened compared to September 2015. Since then he's been on 2 antibiotic   courses at least per his history. He tells me that despite these antibiotic courses and some and some clearing of the sputum his sputum continues to have a low color periodically. He also has progressive worsening of dyspnea. Only notices dyspnea for even going from one room to the other. Overall it is a marked decline. We did a spirometry today in the office and his FVC is 3.3 L which is 368 mL decline compared to one month ago he had CT scan of the chest yesterday shows progressive groundglass opacities compared to 2014. And definitely worse compared to 2010  No active fever. He continues on the [place]  version of Esbriet. He is tolerating that fine. He is also on anticoagulation apixaban   Review of Systems  Constitutional: Negative for fever and unexpected weight change.  HENT: Positive for congestion and sinus pressure. Negative for dental  problem, ear pain, nosebleeds, postnasal drip, rhinorrhea, sneezing, sore throat and trouble swallowing.   Eyes: Negative for redness and itching.  Respiratory: Positive for cough and shortness of breath. Negative for chest tightness and wheezing.   Cardiovascular: Negative for palpitations and leg swelling.  Gastrointestinal: Negative for nausea and vomiting.  Genitourinary: Negative for dysuria.  Musculoskeletal: Negative for joint swelling.  Skin: Negative for rash.  Neurological: Negative for headaches.  Hematological: Does not bruise/bleed easily.  Psychiatric/Behavioral: Negative for dysphoric mood. The patient is not nervous/anxious.     Current outpatient prescriptions:  .  albuterol (PROAIR HFA) 108 (90 BASE) MCG/ACT inhaler, Inhale 2 puffs into the lungs 4 (four) times daily as needed for wheezing or shortness of breath., Disp: 1 Inhaler, Rfl: 5 .  apixaban (ELIQUIS) 5 MG TABS tablet, Take 5 mg by mouth 2 (two) times daily., Disp: , Rfl:  .  baclofen (LIORESAL) 20 MG tablet, Take 20 mg by mouth daily., Disp: , Rfl:  .  benzonatate (TESSALON) 200 MG capsule, Take 1 capsule (200 mg total) by mouth 3 (three) times daily as needed for cough., Disp: 60 capsule, Rfl: 3 .  chlorpheniramine-HYDROcodone (TUSSIONEX PENNKINETIC ER) 10-8 MG/5ML LQCR, Take 5 mLs by mouth every 12 (twelve) hours as needed., Disp: 300 mL, Rfl: 0 .  Cholecalciferol (VITAMIN D3) 1000 UNITS CAPS, Take 1 capsule by mouth daily., Disp: , Rfl:  .  dextromethorphan-guaiFENesin (MUCINEX DM) 30-600 MG per 12 hr tablet, Take 1 tablet by mouth 2 (two) times daily as needed for cough., Disp: , Rfl:  .  esomeprazole (NEXIUM) 40 MG capsule, Take 40 mg by mouth daily before breakfast.  , Disp: , Rfl:  .  fentaNYL (DURAGESIC - DOSED MCG/HR) 25 MCG/HR patch, 1 patch every other day, alternating with 12mg /patch, Disp: , Rfl:  .  Fluticasone-Salmeterol (ADVAIR) 250-50 MCG/DOSE AEPB, Inhale 1 puff into the lungs every 12 (twelve)  hours., Disp: , Rfl:  .  gabapentin (NEURONTIN) 300 MG capsule, Take 600 mg by mouth 2 (two) times daily. , Disp: , Rfl:  .  hydrocortisone (CORTEF) 5 MG tablet, Take 5 mg by mouth daily. 15 mg in AM and 5 mg in PM, Disp: , Rfl:  .  mupirocin ointment (BACTROBAN) 2 %, Apply 1 application topically 2 (two) times daily., Disp: 22 g, Rfl: 3 .  PIRFENIDONE PO, 200mg , take 4 tablets three times a day, Disp: , Rfl:  .  predniSONE (DELTASONE) 10 MG tablet, Take 10 mg by mouth daily with breakfast., Disp: , Rfl:  .  sulfamethoxazole-trimethoprim (BACTRIM DS) 800-160 MG per tablet, Take 1 tablet by mouth every other day., Disp: , Rfl:  .  tacrolimus (PROGRAF) 1 MG capsule, Take 2 mg by mouth 2 (two) times daily. , Disp: , Rfl:  .  topiramate (TOPAMAX) 25 MG tablet, Take 25 mg by mouth 2 (two) times daily., Disp: , Rfl:  .  Vilazodone HCl (VIIBRYD) 40 MG TABS, Take 40 mg by mouth daily., Disp: , Rfl:      Objective:   Physical Exam  Filed Vitals:   07/17/14 1310  BP: 138/98  Pulse: 115  Height: 6\' 1"  (1.854 m)  Weight: 231 lb (104.781 kg)  SpO2: 97%   On 6L [place]   onstitutional:  He is oriented to person, place, and time. He appears well-developed and well-nourished. No distress.  albino  HENT:  Head: Normocephalic and atraumatic.  Right Ear: External ear normal.  Left Ear: External ear normal.  Mouth/Throat: Oropharynx is clear and moist. No oropharyngeal exudate.  o2 on  Eyes: Conjunctivae and EOM are normal. Pupils are equal, round, and reactive to light. Right eye exhibits no discharge. Left eye exhibits no discharge. No scleral icterus.  Neck: Normal range of motion. Neck supple. No JVD present. No tracheal deviation present. No thyromegaly present.  Cardiovascular: Normal rate, regular rhythm and intact distal pulses.  Exam reveals no gallop and no friction rub.   No murmur heard. Pulmonary/Chest: Effort normal and breath sounds normal. No respiratory distress. He has no wheezes. He  has no rales. He exhibits no tenderness.  occ scattered crackles + - baseline  no change  Abdominal: Soft. Bowel sounds are normal. He exhibits no distension and no mass. There is no tenderness. There is no rebound and no guarding.  Musculoskeletal: Normal range of motion. He exhibits no edema and no tenderness.  Lymphadenopathy:    He has no cervical adenopathy.  Neurological: He is alert and oriented to person, place, and time. He has normal reflexes. No cranial nerve deficit. Coordination normal.  Skin: Skin is warm and dry. No rash noted. He is not diaphoretic. No erythema. No pallor.  Psychiatric: He has a normal mood and affect. His behavior is normal. Judgment and thought content normal.      Assessment & Plan:     ICD-9-CM ICD-10-CM   1. ILD (interstitial lung disease) - NSIP Fibrotic Pattern on CT April 2014 associated with Hermansy Pudlak Syndrome 515 J84.9 Spirometry with Graph  2. Chronic respiratory failure with hypoxia 518.83 J96.11    799.02     He has had another 10% decline in his forced vital capacity does not matter of 3-4 weeks. Most likely this is due to progressive interstitial lung disease. There is a possibility that he has opportunistic infection particularly because he is on Prograf. Ideally he needs a bronchoscopy with lavage to rule out opportunistic infections. However this poses significant risk for worsening respiratory failure and intubation. This is a risk that maybe he nor I are willing to take particularly with him moving to [place] within the next 2 weeks for transplant listing. Alternative is to am pretty treat with prednisone and a different antibiotic and hopefully best. He is agreed to this therapy because this will not jeopardize his transplant listing. Meanwhile he will talk to his transfer and coordinator and give my data of worsening lung function and hopefully this can bump him up the transplant list.  He and his father who is here today understand his  prognosis is getting poorer and our options are running out   we agreed that given progressive interstitial lung disease for [place] version of Esbriet is not working anymore and therefore we will stop it   He can return to this office anytime he needs   > 50% of this > 25 min visit spent in face to face counseling or coordination of care (15 min visit converted to 25 min)   .Dr. Kalman Shan, M.D., [facility].C.P Pulmonary and Critical Care Medicine Staff Physician [facility] Pager: [phone], If no answer or between  15:00h - 7:00h: call [phone]  07/17/2014 1:55 PM

## 2014-07-17 NOTE — Telephone Encounter (Signed)
MR, checking to see if you will be able to be here at 12pm. Will call pt to advise to show later if needed.

## 2014-07-17 NOTE — Telephone Encounter (Signed)
Called and spoke to pt. Informed pt to arrive at 1pm for appt. Pt verbalized understanding and denied any further questions or concerns at this time.

## 2014-07-17 NOTE — Telephone Encounter (Signed)
No will be late. Just have him come later and we can start 1.15pm

## 2014-07-20 NOTE — Telephone Encounter (Signed)
Order has been placed per MR. I attempted to call [facility]. Was placed on hold for 15 minutes with no representative coming to the line.

## 2014-07-20 NOTE — Telephone Encounter (Signed)
Sharyn Creameralled [facility], spoke with Lupita LeashDonna, made aware that Rx being faxed today. Lupita LeashDonna states that we will get a call back once they received the order if any further needed.

## 2014-07-21 ENCOUNTER — Telehealth: Payer: Self-pay | Admitting: Internal Medicine

## 2014-07-21 ENCOUNTER — Encounter (HOSPITAL_COMMUNITY): Payer: 59

## 2014-07-21 NOTE — Telephone Encounter (Signed)
Return call.[name] °

## 2014-07-21 NOTE — Telephone Encounter (Signed)
Spoke with [name]  She is concerned that the pt is going to have to travel to [place] on 10lpm  When she spoke with the pt, he was apparently clueless about having to use 10 lpm  MR, please advise thanks!

## 2014-07-21 NOTE — Telephone Encounter (Signed)
lmtcb for [name].  

## 2014-07-22 NOTE — Telephone Encounter (Signed)
[phone] for 1017m ICU but call [phone] and check with ICU secy first

## 2014-07-22 NOTE — Telephone Encounter (Signed)
Lm for carol to let her know this would be signed and faxed tomorrow.  Since MR is going to be in clinic tomorrow this can be signed and faxed then.  Forwarding to CasasElise to follow up on.

## 2014-07-22 NOTE — Telephone Encounter (Signed)
Have Okey RegalCarol call me please? I am surprised patient was clueless. As long pulse ox can be maintained > 88% or maybe even > 85% for road trip is fine

## 2014-07-22 NOTE — Telephone Encounter (Signed)
Spoke with Okey Regalarol and gave her MR's cell phone number. Will route message back to MR so that he may document on this message.

## 2014-07-22 NOTE — Telephone Encounter (Signed)
MR do you know the fax number this should be sent to?

## 2014-07-22 NOTE — Telephone Encounter (Signed)
Okey Regalarol from [facility] calling back to add to order form sent over, I have obtained it and I added note to order and she has asked me to give to Mardella LaymanLindsey to ensure the order is signed and sent back, pt is leaving for [place] and needs this signed asap. Also states that she has call MR. I am giving this form to BanksLindsey.

## 2014-07-22 NOTE — Telephone Encounter (Signed)
Anything to sign please bring it to [facility] ICU at [facility] or fax it over or wait till 07/23/14 pm when I come to see a patient in [place]

## 2014-07-23 ENCOUNTER — Encounter (HOSPITAL_COMMUNITY): Payer: 59

## 2014-07-24 NOTE — Telephone Encounter (Signed)
Called and spoke to Rainierarol to inform her of the fax. Okey RegalCarol verbalized understanding and denied any further questions or concerns at this time.

## 2014-07-24 NOTE — Telephone Encounter (Signed)
Form faxed to [facility].

## 2014-07-28 ENCOUNTER — Encounter (HOSPITAL_COMMUNITY): Payer: 59

## 2014-07-30 ENCOUNTER — Encounter (HOSPITAL_COMMUNITY): Payer: 59

## 2014-08-04 ENCOUNTER — Encounter (HOSPITAL_COMMUNITY): Payer: 59

## 2014-08-06 ENCOUNTER — Ambulatory Visit: Payer: Medicare Other | Admitting: Internal Medicine

## 2014-08-06 ENCOUNTER — Encounter (HOSPITAL_COMMUNITY): Payer: 59

## 2014-08-11 ENCOUNTER — Encounter (HOSPITAL_COMMUNITY): Payer: 59

## 2014-08-13 ENCOUNTER — Encounter (HOSPITAL_COMMUNITY): Payer: 59

## 2014-08-18 ENCOUNTER — Encounter (HOSPITAL_COMMUNITY): Payer: 59

## 2014-08-20 ENCOUNTER — Encounter (HOSPITAL_COMMUNITY): Payer: 59

## 2014-08-25 ENCOUNTER — Encounter (HOSPITAL_COMMUNITY): Payer: 59

## 2014-08-27 ENCOUNTER — Encounter (HOSPITAL_COMMUNITY): Payer: 59

## 2014-09-01 ENCOUNTER — Encounter (HOSPITAL_COMMUNITY): Payer: 59

## 2014-09-03 ENCOUNTER — Encounter (HOSPITAL_COMMUNITY): Payer: 59

## 2014-09-18 IMAGING — CT CT CHEST W/O CM
2 of 4 series · 15 of 36 positions shown, 18 images · IV contrast (Omnipaque 300)
Comparison: Plain film of 09/23/2012.  CT of 07/28/2008.

CLINICAL DATA: Pulmonary fibrosis.  Hermansky Pudlak  syndrome.

CT CHEST WITHOUT CONTRAST
TECHNIQUE: Multidetector CT imaging of the chest was performed
following the standard protocol without IV contrast.

[Series 2: chest routine with · axial · 0.77mm/px · z∈[-333,-63]mm · 12 of 64 slices shown, 15 images]
[im 5/64  mediastinal]
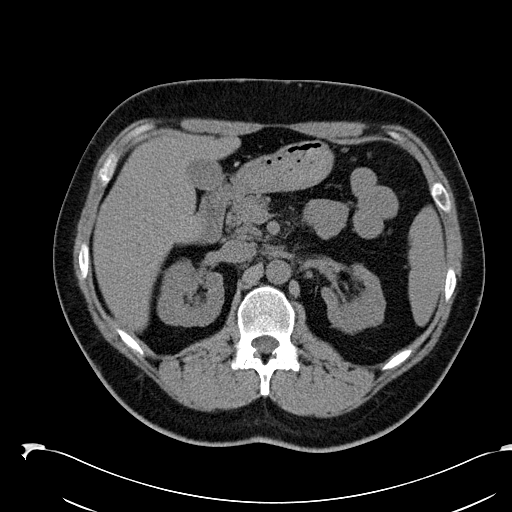
[im 5/64  lung]
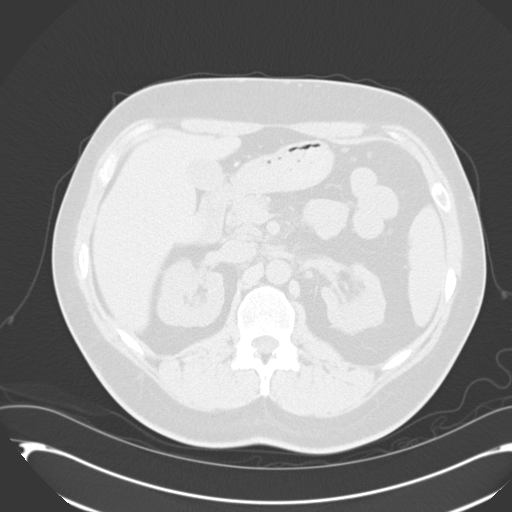
[im 10/64  lung]
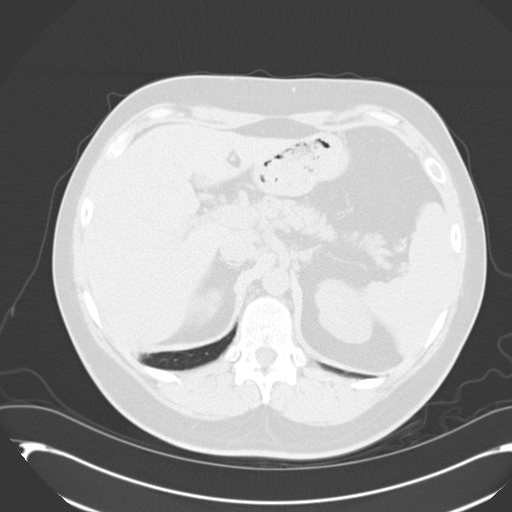
[im 15/64  lung]
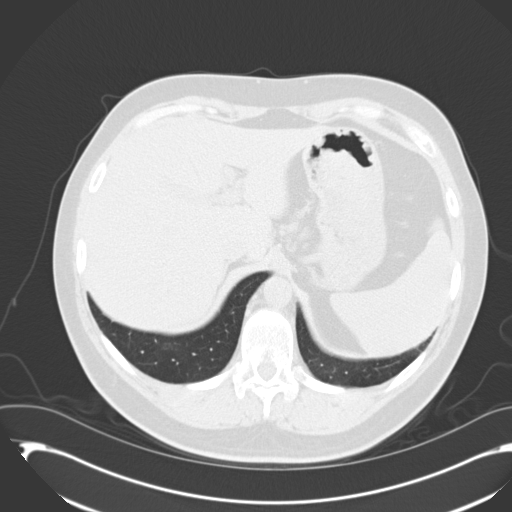
[im 20/64  lung]
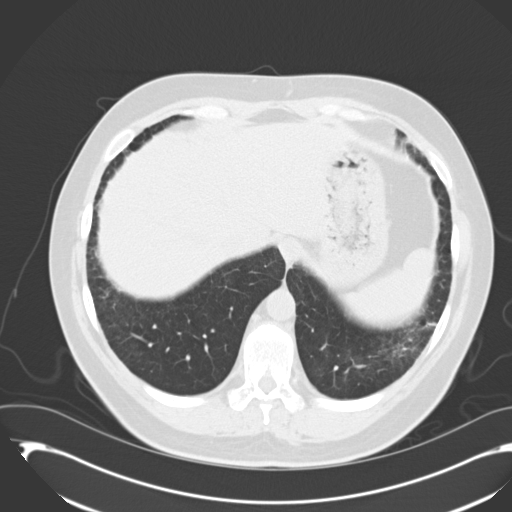
[im 25/64  mediastinal]
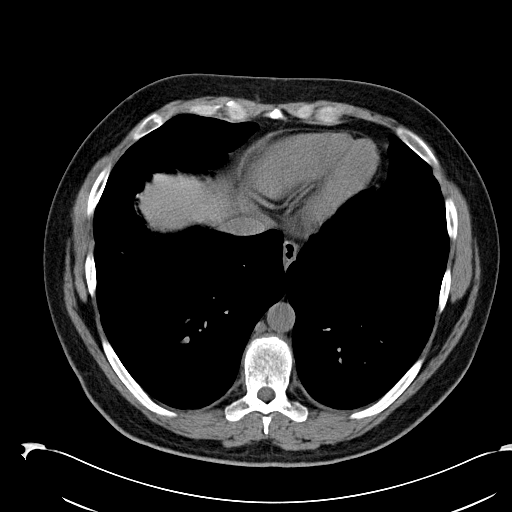
[im 25/64  lung]
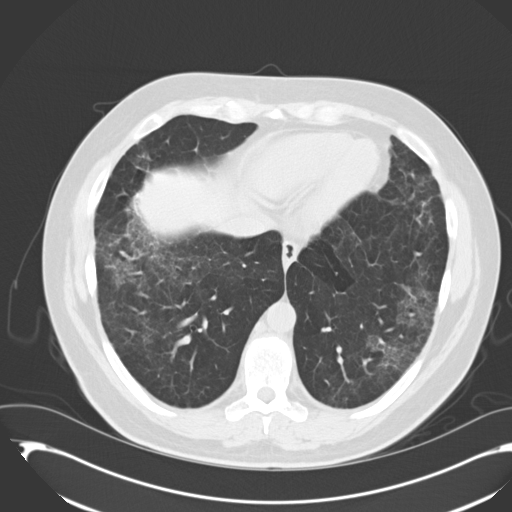
[im 30/64  lung]
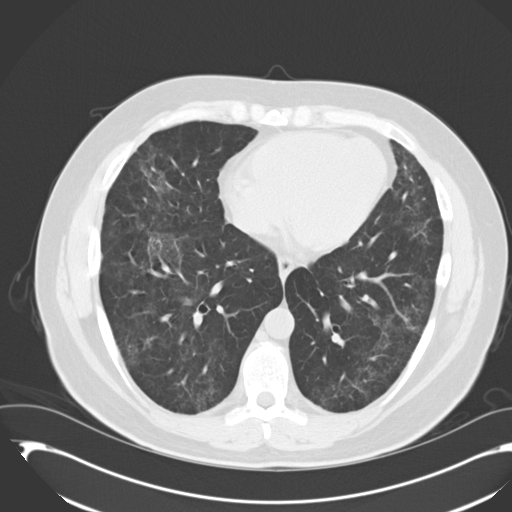
[im 34/64  lung]
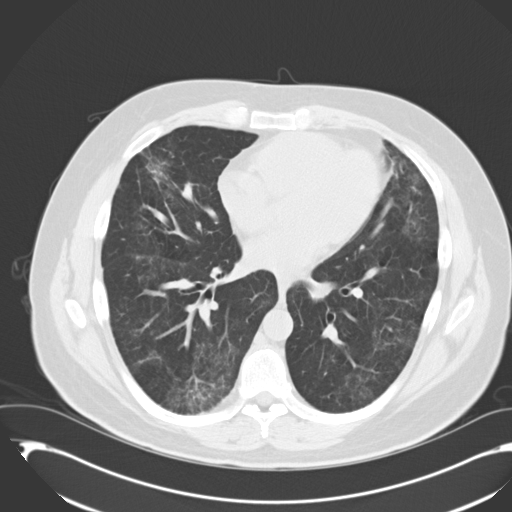
[im 39/64  lung]
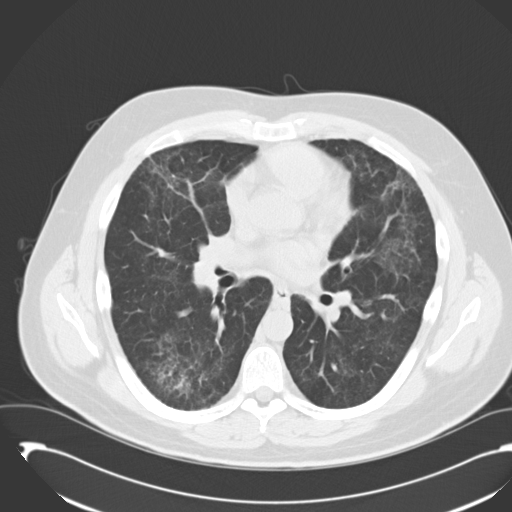
[im 44/64  mediastinal]
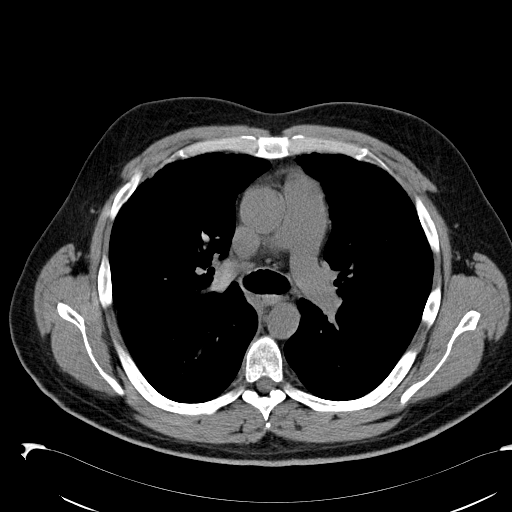
[im 44/64  lung]
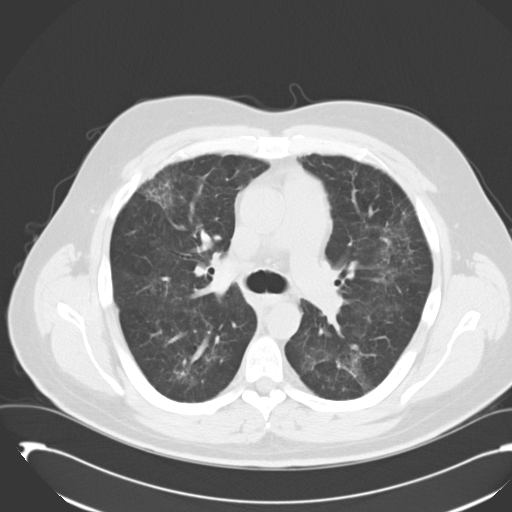
[im 49/64  lung]
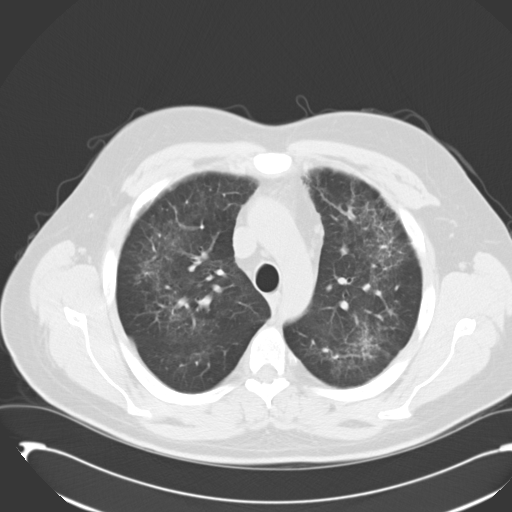
[im 54/64  lung]
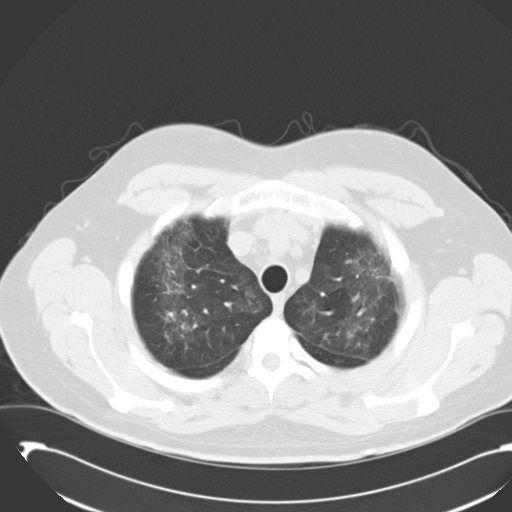
[im 59/64  lung]
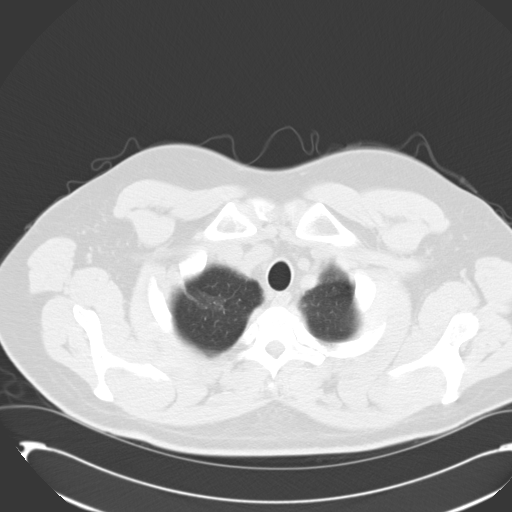

[Series 602: cor · coronal · 0.77mm/px · 3 of 125 slices shown]
[im 25/125  lung]
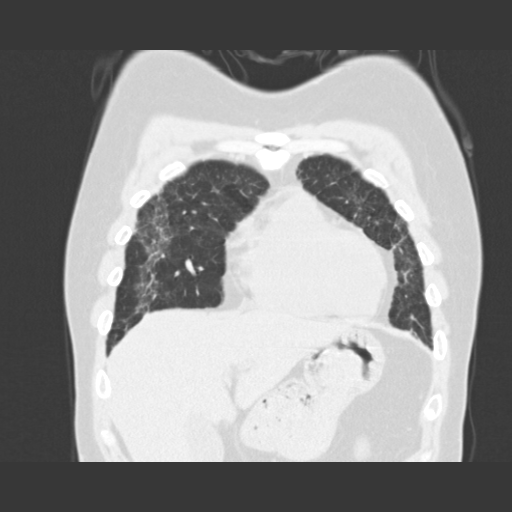
[im 50/125  lung]
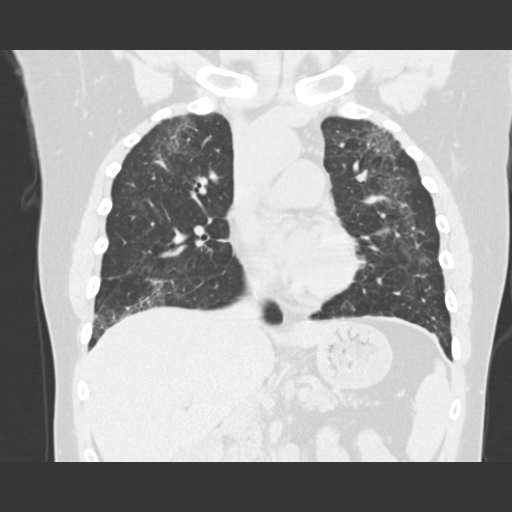
[im 75/125  lung]
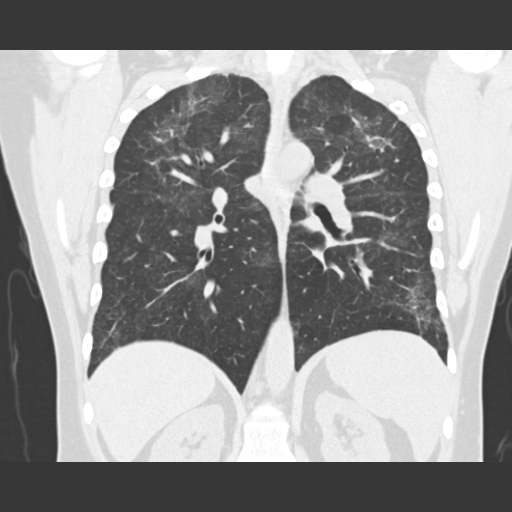

[15 of 36 positions shown; findings below may reference images not displayed]

FINDINGS: Lungs/pleura: Resolution of previous described bilateral
pulmonary nodules.

Interval development of areas of peribronchovascular ground-glass
opacity with architectural distortion and minimal traction
bronchiectasis.   This is slightly upper lobe predominant.

No areas of consolidation.

High resolution imaging not performed. No pleural fluid.

Heart/Mediastinum:  Normal heart size, without pericardial
effusion.  Multiple small mediastinal nodes.  A precarinal node
measures 1.2 cm on image 19/series 2, but is similar to on the
prior exam. Hilar regions poorly evaluated without intravenous
contrast.

Upper abdomen: No significant findings.

Bones/Musculoskeletal:  Negative.
IMPRESSION: Slightly upper lobe predominant peribronchovascular fibrosis, as
detailed above.  This is new since 07/28/2008.

No evidence of acute superimposed process.

Mild thoracic adenopathy, favored to be reactive.

## 2014-10-11 DEATH — deceased
